# Patient Record
Sex: Female | Born: 1950 | Race: Black or African American | Hispanic: No | Smoking: Never smoker
Health system: Southern US, Community
[De-identification: ages and names within clinical notes are randomized; demographics above are authoritative.]

## PROBLEM LIST (undated history)

## (undated) DIAGNOSIS — K635 Polyp of colon: Secondary | ICD-10-CM

## (undated) DIAGNOSIS — T7840XA Allergy, unspecified, initial encounter: Secondary | ICD-10-CM

## (undated) DIAGNOSIS — M1710 Unilateral primary osteoarthritis, unspecified knee: Secondary | ICD-10-CM

## (undated) DIAGNOSIS — M810 Age-related osteoporosis without current pathological fracture: Secondary | ICD-10-CM

## (undated) DIAGNOSIS — E079 Disorder of thyroid, unspecified: Secondary | ICD-10-CM

## (undated) DIAGNOSIS — H409 Unspecified glaucoma: Secondary | ICD-10-CM

## (undated) DIAGNOSIS — M109 Gout, unspecified: Secondary | ICD-10-CM

## (undated) DIAGNOSIS — I10 Essential (primary) hypertension: Secondary | ICD-10-CM

## (undated) DIAGNOSIS — E119 Type 2 diabetes mellitus without complications: Secondary | ICD-10-CM

## (undated) DIAGNOSIS — I5032 Chronic diastolic (congestive) heart failure: Secondary | ICD-10-CM

## (undated) DIAGNOSIS — E785 Hyperlipidemia, unspecified: Secondary | ICD-10-CM

## (undated) HISTORY — PX: ENUCLEATION: SHX628

## (undated) HISTORY — DX: Hyperlipidemia, unspecified: E78.5

## (undated) HISTORY — DX: Type 2 diabetes mellitus without complications: E11.9

## (undated) HISTORY — PX: SALPINGECTOMY: SHX328

## (undated) HISTORY — DX: Chronic diastolic (congestive) heart failure: I50.32

## (undated) HISTORY — DX: Unilateral primary osteoarthritis, unspecified knee: M17.10

## (undated) HISTORY — PX: COLON SURGERY: SHX602

## (undated) HISTORY — DX: Unspecified glaucoma: H40.9

## (undated) HISTORY — DX: Allergy, unspecified, initial encounter: T78.40XA

## (undated) HISTORY — DX: Essential (primary) hypertension: I10

## (undated) HISTORY — DX: Polyp of colon: K63.5

## (undated) HISTORY — PX: OOPHORECTOMY: SHX86

## (undated) HISTORY — PX: WISDOM TOOTH EXTRACTION: SHX21

## (undated) HISTORY — DX: Age-related osteoporosis without current pathological fracture: M81.0

## (undated) HISTORY — PX: THYROIDECTOMY, PARTIAL: SHX18

## (undated) HISTORY — DX: Gout, unspecified: M10.9

---

## 1998-07-07 ENCOUNTER — Emergency Department (HOSPITAL_COMMUNITY): Admission: EM | Admit: 1998-07-07 | Discharge: 1998-07-07 | Payer: Self-pay | Admitting: Emergency Medicine

## 1998-08-11 ENCOUNTER — Ambulatory Visit: Admission: RE | Admit: 1998-08-11 | Discharge: 1998-08-11 | Payer: Self-pay | Admitting: Internal Medicine

## 1998-09-09 ENCOUNTER — Encounter: Payer: Self-pay | Admitting: Orthopedic Surgery

## 1998-09-09 ENCOUNTER — Ambulatory Visit (HOSPITAL_COMMUNITY): Admission: RE | Admit: 1998-09-09 | Discharge: 1998-09-09 | Payer: Self-pay | Admitting: Orthopedic Surgery

## 1998-09-15 ENCOUNTER — Encounter: Admission: RE | Admit: 1998-09-15 | Discharge: 1998-12-14 | Payer: Self-pay | Admitting: Orthopedic Surgery

## 1998-09-30 ENCOUNTER — Ambulatory Visit (HOSPITAL_COMMUNITY): Admission: RE | Admit: 1998-09-30 | Discharge: 1998-09-30 | Payer: Self-pay | Admitting: Orthopedic Surgery

## 1999-05-13 ENCOUNTER — Emergency Department (HOSPITAL_COMMUNITY): Admission: EM | Admit: 1999-05-13 | Discharge: 1999-05-13 | Payer: Self-pay | Admitting: Emergency Medicine

## 1999-09-29 ENCOUNTER — Other Ambulatory Visit: Admission: RE | Admit: 1999-09-29 | Discharge: 1999-09-29 | Payer: Self-pay | Admitting: Gynecology

## 1999-09-29 ENCOUNTER — Encounter (INDEPENDENT_AMBULATORY_CARE_PROVIDER_SITE_OTHER): Payer: Self-pay

## 1999-11-10 ENCOUNTER — Encounter (INDEPENDENT_AMBULATORY_CARE_PROVIDER_SITE_OTHER): Payer: Self-pay | Admitting: Specialist

## 1999-11-10 ENCOUNTER — Ambulatory Visit (HOSPITAL_COMMUNITY): Admission: RE | Admit: 1999-11-10 | Discharge: 1999-11-10 | Payer: Self-pay | Admitting: Gynecology

## 1999-11-18 ENCOUNTER — Other Ambulatory Visit: Admission: RE | Admit: 1999-11-18 | Discharge: 1999-11-18 | Payer: Self-pay | Admitting: Gynecology

## 2000-02-02 ENCOUNTER — Other Ambulatory Visit: Admission: RE | Admit: 2000-02-02 | Discharge: 2000-02-02 | Payer: Self-pay | Admitting: Gynecology

## 2000-02-14 ENCOUNTER — Inpatient Hospital Stay (HOSPITAL_COMMUNITY): Admission: RE | Admit: 2000-02-14 | Discharge: 2000-02-16 | Payer: Self-pay | Admitting: Gynecology

## 2000-02-14 ENCOUNTER — Encounter (INDEPENDENT_AMBULATORY_CARE_PROVIDER_SITE_OTHER): Payer: Self-pay

## 2000-10-23 HISTORY — PX: TOTAL ABDOMINAL HYSTERECTOMY: SHX209

## 2001-05-28 ENCOUNTER — Other Ambulatory Visit: Admission: RE | Admit: 2001-05-28 | Discharge: 2001-05-28 | Payer: Self-pay | Admitting: Gynecology

## 2003-04-29 ENCOUNTER — Other Ambulatory Visit: Admission: RE | Admit: 2003-04-29 | Discharge: 2003-04-29 | Payer: Self-pay | Admitting: Gynecology

## 2004-02-18 ENCOUNTER — Ambulatory Visit (HOSPITAL_COMMUNITY): Admission: RE | Admit: 2004-02-18 | Discharge: 2004-02-18 | Payer: Self-pay | Admitting: Internal Medicine

## 2004-04-29 ENCOUNTER — Other Ambulatory Visit: Admission: RE | Admit: 2004-04-29 | Discharge: 2004-04-29 | Payer: Self-pay | Admitting: Gynecology

## 2005-06-30 ENCOUNTER — Ambulatory Visit: Payer: Self-pay | Admitting: Internal Medicine

## 2005-07-07 ENCOUNTER — Ambulatory Visit: Payer: Self-pay | Admitting: Internal Medicine

## 2005-07-28 ENCOUNTER — Ambulatory Visit: Payer: Self-pay | Admitting: Internal Medicine

## 2005-10-09 ENCOUNTER — Ambulatory Visit: Payer: Self-pay | Admitting: Gastroenterology

## 2005-10-18 ENCOUNTER — Encounter (INDEPENDENT_AMBULATORY_CARE_PROVIDER_SITE_OTHER): Payer: Self-pay | Admitting: Specialist

## 2005-10-18 ENCOUNTER — Ambulatory Visit: Payer: Self-pay | Admitting: Gastroenterology

## 2005-11-17 ENCOUNTER — Ambulatory Visit (HOSPITAL_COMMUNITY): Admission: RE | Admit: 2005-11-17 | Discharge: 2005-11-17 | Payer: Self-pay | Admitting: Gastroenterology

## 2006-10-03 ENCOUNTER — Ambulatory Visit: Payer: Self-pay | Admitting: Internal Medicine

## 2006-10-03 LAB — CONVERTED CEMR LAB
ALT: 17 units/L (ref 0–40)
Albumin: 4.3 g/dL (ref 3.5–5.2)
Alkaline Phosphatase: 65 units/L (ref 39–117)
Calcium: 9.8 mg/dL (ref 8.4–10.5)
Chol/HDL Ratio, serum: 3.6
Creatinine, Ser: 0.7 mg/dL (ref 0.4–1.2)
GFR calc non Af Amer: 92 mL/min
Glomerular Filtration Rate, Af Am: 112 mL/min/{1.73_m2}
Hgb A1c MFr Bld: 5.7 % (ref 4.6–6.0)
Potassium: 4.2 meq/L (ref 3.5–5.1)
Sodium: 141 meq/L (ref 135–145)
Total Bilirubin: 1.1 mg/dL (ref 0.3–1.2)
VLDL: 14 mg/dL (ref 0–40)

## 2007-07-12 ENCOUNTER — Ambulatory Visit: Payer: Self-pay | Admitting: Internal Medicine

## 2007-07-12 ENCOUNTER — Encounter: Payer: Self-pay | Admitting: Internal Medicine

## 2007-07-12 DIAGNOSIS — I10 Essential (primary) hypertension: Secondary | ICD-10-CM | POA: Insufficient documentation

## 2007-07-12 DIAGNOSIS — M1710 Unilateral primary osteoarthritis, unspecified knee: Secondary | ICD-10-CM | POA: Insufficient documentation

## 2007-09-05 ENCOUNTER — Telehealth: Payer: Self-pay | Admitting: Internal Medicine

## 2007-10-14 ENCOUNTER — Encounter: Admission: RE | Admit: 2007-10-14 | Discharge: 2007-10-14 | Payer: Self-pay | Admitting: Orthopedic Surgery

## 2007-11-06 ENCOUNTER — Ambulatory Visit (HOSPITAL_COMMUNITY): Admission: RE | Admit: 2007-11-06 | Discharge: 2007-11-07 | Payer: Self-pay | Admitting: Orthopedic Surgery

## 2007-12-05 ENCOUNTER — Encounter: Admission: RE | Admit: 2007-12-05 | Discharge: 2008-03-04 | Payer: Self-pay | Admitting: Orthopedic Surgery

## 2008-03-05 ENCOUNTER — Encounter: Admission: RE | Admit: 2008-03-05 | Discharge: 2008-06-03 | Payer: Self-pay | Admitting: Orthopedic Surgery

## 2008-03-09 ENCOUNTER — Encounter: Admission: RE | Admit: 2008-03-09 | Discharge: 2008-03-09 | Payer: Self-pay | Admitting: Orthopedic Surgery

## 2008-07-15 ENCOUNTER — Encounter: Admission: RE | Admit: 2008-07-15 | Discharge: 2008-07-15 | Payer: Self-pay | Admitting: Orthopedic Surgery

## 2008-09-14 ENCOUNTER — Ambulatory Visit (HOSPITAL_COMMUNITY): Admission: RE | Admit: 2008-09-14 | Discharge: 2008-09-14 | Payer: Self-pay | Admitting: Orthopedic Surgery

## 2008-09-21 ENCOUNTER — Encounter: Payer: Self-pay | Admitting: Internal Medicine

## 2008-09-22 ENCOUNTER — Ambulatory Visit: Payer: Self-pay | Admitting: Internal Medicine

## 2008-09-22 DIAGNOSIS — H409 Unspecified glaucoma: Secondary | ICD-10-CM | POA: Insufficient documentation

## 2008-09-22 DIAGNOSIS — J309 Allergic rhinitis, unspecified: Secondary | ICD-10-CM | POA: Insufficient documentation

## 2008-09-22 DIAGNOSIS — M109 Gout, unspecified: Secondary | ICD-10-CM | POA: Insufficient documentation

## 2008-09-22 LAB — CONVERTED CEMR LAB
Basophils Absolute: 0.1 10*3/uL (ref 0.0–0.1)
CO2: 30 meq/L (ref 19–32)
Calcium: 9.4 mg/dL (ref 8.4–10.5)
Eosinophils Absolute: 0.2 10*3/uL (ref 0.0–0.7)
Glucose, Bld: 103 mg/dL — ABNORMAL HIGH (ref 70–99)
HDL: 63.9 mg/dL (ref >39.0)
Hemoglobin: 12.2 g/dL (ref 12.0–15.0)
MCV: 86.4 fL (ref 78.0–100.0)
Neutrophils Relative %: 53.2 % (ref 43.0–77.0)
Potassium: 4.3 meq/L (ref 3.5–5.1)
RBC: 4.1 M/uL (ref 3.87–5.11)
Sodium: 139 meq/L (ref 135–145)
Total CHOL/HDL Ratio: 3.2
Triglycerides: 106 mg/dL (ref 0–149)
Uric Acid, Serum: 4.3 mg/dL (ref 2.4–7.0)

## 2008-09-28 ENCOUNTER — Encounter: Payer: Self-pay | Admitting: Internal Medicine

## 2009-11-04 ENCOUNTER — Ambulatory Visit: Payer: Self-pay | Admitting: Internal Medicine

## 2009-11-04 LAB — CONVERTED CEMR LAB
AST: 16 units/L (ref 0–37)
Albumin: 4.4 g/dL (ref 3.5–5.2)
BUN: 10 mg/dL (ref 6–23)
Bilirubin Urine: NEGATIVE
Bilirubin, Direct: 0.1 mg/dL (ref 0.0–0.3)
CO2: 27 meq/L (ref 19–32)
Cholesterol: 213 mg/dL — ABNORMAL HIGH (ref 0–200)
Creatinine, Ser: 0.8 mg/dL (ref 0.4–1.2)
Direct LDL: 132.2 mg/dL
Glucose, Bld: 115 mg/dL — ABNORMAL HIGH (ref 70–99)
Ketones, ur: NEGATIVE mg/dL
Leukocytes, UA: NEGATIVE
Lymphocytes Relative: 37.2 % (ref 12.0–46.0)
MCHC: 33.2 g/dL (ref 30.0–36.0)
MCV: 87.2 fL (ref 78.0–100.0)
Monocytes Relative: 5.4 % (ref 3.0–12.0)
Platelets: 279 10*3/uL (ref 150.0–400.0)
Potassium: 4.1 meq/L (ref 3.5–5.1)
RDW: 12.9 % (ref 11.5–14.6)
Sodium: 134 meq/L — ABNORMAL LOW (ref 135–145)
Specific Gravity, Urine: 1.02 (ref 1.000–1.030)
TSH: 1.19 microintl units/mL (ref 0.35–5.50)
Total Protein, Urine: NEGATIVE mg/dL
Urine Glucose: NEGATIVE mg/dL
Urobilinogen, UA: 0.2 (ref 0.0–1.0)

## 2009-11-09 ENCOUNTER — Ambulatory Visit: Payer: Self-pay | Admitting: Internal Medicine

## 2009-11-10 ENCOUNTER — Encounter: Payer: Self-pay | Admitting: Internal Medicine

## 2009-11-29 ENCOUNTER — Telehealth (INDEPENDENT_AMBULATORY_CARE_PROVIDER_SITE_OTHER): Payer: Self-pay | Admitting: *Deleted

## 2010-09-13 ENCOUNTER — Encounter: Payer: Self-pay | Admitting: Gastroenterology

## 2010-09-28 ENCOUNTER — Encounter: Payer: Self-pay | Admitting: Gastroenterology

## 2010-09-29 ENCOUNTER — Encounter (INDEPENDENT_AMBULATORY_CARE_PROVIDER_SITE_OTHER): Payer: Self-pay | Admitting: *Deleted

## 2010-10-03 ENCOUNTER — Ambulatory Visit: Payer: Self-pay | Admitting: Gastroenterology

## 2010-10-03 ENCOUNTER — Encounter
Admission: RE | Admit: 2010-10-03 | Discharge: 2010-10-03 | Payer: Self-pay | Source: Home / Self Care | Attending: Orthopedic Surgery | Admitting: Orthopedic Surgery

## 2010-10-14 ENCOUNTER — Ambulatory Visit: Payer: Self-pay | Admitting: Gastroenterology

## 2010-10-14 DIAGNOSIS — R933 Abnormal findings on diagnostic imaging of other parts of digestive tract: Secondary | ICD-10-CM | POA: Insufficient documentation

## 2010-10-14 HISTORY — PX: COLONOSCOPY: SHX174

## 2010-10-18 ENCOUNTER — Telehealth: Payer: Self-pay | Admitting: Gastroenterology

## 2010-10-31 ENCOUNTER — Encounter: Payer: Self-pay | Admitting: Gastroenterology

## 2010-11-13 ENCOUNTER — Encounter: Payer: Self-pay | Admitting: Gastroenterology

## 2010-11-16 LAB — BASIC METABOLIC PANEL
Calcium: 9.3 mg/dL (ref 8.4–10.5)
Creatinine, Ser: 0.97 mg/dL (ref 0.4–1.2)
GFR calc Af Amer: >60 mL/min (ref >60)
GFR calc non Af Amer: 59 mL/min — ABNORMAL LOW (ref >60)
Sodium: 138 mEq/L (ref 135–145)

## 2010-11-18 ENCOUNTER — Ambulatory Visit
Admission: RE | Admit: 2010-11-18 | Discharge: 2010-11-18 | Payer: Self-pay | Source: Home / Self Care | Attending: Surgery | Admitting: Surgery

## 2010-11-18 LAB — POCT I-STAT, CHEM 8
Chloride: 106 mEq/L (ref 96–112)
Potassium: 3.7 mEq/L (ref 3.5–5.1)

## 2010-11-22 NOTE — Assessment & Plan Note (Signed)
Summary: CPX / NWS   #   Vital Signs:  Patient profile:   60 year old female Height:      67 inches Weight:      181 pounds BMI:     28.45 O2 Sat:      98 % on Room air Temp:     98.2 degrees F oral Pulse rate:   58 / minute BP sitting:   158 / 82  (left arm) Cuff size:   large  Vitals Entered By: Bill Salinas CMA (November 09, 2009 2:32 PM)  O2 Flow:  Room air CC: pt here for CPX. she will get her flu shot today and states she is sch for a mammo tomm. pt gets his paps/ab  Vision Screening:      Vision Comments: Last Eye exam 09/2009 which was normal with Dr Melene Muller at [facility] Entered By: Bill Salinas CMA (November 09, 2009 2:37 PM)   Primary Care Provider:  Collie Kittel  CC:  pt here for CPX. she will get her flu shot today and states she is sch for a mammo tomm. pt gets his paps/ab.  History of Present Illness: Patient presents for routine medical followup and medication refills. She is under a little pressure with a family member living with her and others with illness: sister hip replacement and brother with hemorrhage.  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 She is also having problems with her right hip. She sees Dr. Merton Border carter for ortho.  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   She had a mole removed from left check - path report pending.   Current Medications (verified): 1)  Furosemide 20 Mg  Tabs (Furosemide) .... Once Daily 2)  Nasonex 50 Mcg/act  Susp (Mometasone Furoate) .Marland Kitchen.. 1 Spray/nare Two Times A Day 3)  Astelin 137 Mcg/spray  Soln (Azelastine Hcl) .Marland Kitchen.. 1 Spray/nares Two Times A Day 4)  Amlodipine Besylate 10 Mg  Tabs (Amlodipine Besylate) .... Once Daily 5)  Alphagan P 0.1 % Soln (Brimonidine Tartrate) .... Take 1 Tablet  By Mouth Three Times A Day 6)  Travatan Z 0.004 % Soln (Travoprost) .... Once Daily 7)  Cosopt 2-0.5 % Soln (Dorzolamide-Timolol) .... Two Times A Day 8)  Elestat 0.05 % Soln (Epinastine Hcl) .... Once Daily 9)  Naprelan 375 Mg Xr24h-Tab (Naproxen Sodium) .... Take 1 Tablet By Mouth Two Times A Day 10)  Voltaren 1 % Gel (Diclofenac Sodium) .... Three Times A Day 11)  Retin-A Micro 0.1 % Gel (Tretinoin Microsphere) .... Once Daily  Allergies (verified): 1)  ! Penicillin 2)  ! 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 Marcelle Smiling         Dr. Myrtie Neither                Opthal  Dr. Harlon Flor                Allergy      Dr. Westport Callas  Family History: Last updated: 10/11/2008 father - deceased @ 72: emphysema, HTN, DM mother - 13: OA, HTN Neg- breast or colon cancer; CAD/MI  Social History: Last updated: 11-Oct-2008 HSG, 2 year college Married '73  no children SO- self-employed Homemaker  Review of Systems       The patient complains of severe indigestion/heartburn.  The patient denies anorexia, fever, weight loss, weight gain, hoarseness, chest pain, dyspnea on exertion, abdominal pain, hematochezia, incontinence, difficulty walking, abnormal bleeding, and breast masses.         if she doesn't eat on time she will have bloating, gas and pain.   Physical Exam  General:  WNWD AA female in no distress Head:  normocephalic, atraumatic, and no abnormalities observed.   Eyes:  Prosthetic right eye. OS- C&S clear, pupil reactive. Ears:  R ear normal, L ear normal, and no external deformities.   Nose:  no external deformity and no external erythema.   Mouth:  Oral mucosa and oropharynx without lesions or exudates.  Teeth  in good repair. Neck:  supple, no thyromegaly, and no carotid bruits.   Chest Wall:  no deformities.   Breasts:  deferred to gyun  Lungs:  Normal respiratory effort, chest expands symmetrically. Lungs are clear to auscultation, no crackles or wheezes. Heart:  Normal rate and regular rhythm. S1 and S2 normal without gallop, murmur, click, rub or other extra sounds. Abdomen:  soft, non-tender, normal bowel sounds, and no hepatomegaly.   Genitalia:  deferred to gyn Msk:  normal ROM, no joint tenderness, no joint swelling, no joint warmth, and no joint instability.  Right leg with outward rotation Pulses:  2+ radial pulses Extremities:  No clubbing, cyanosis, edema, or deformity noted with normal full range of motion of all joints.   Neurologic:  alert & oriented X3, cranial nerves II-XII intact, strength normal in all extremities, gait normal, and DTRs symmetrical and normal.   Skin:  turgor normal, color normal, no rashes, no suspicious lesions, no ulcerations, and no edema.   Cervical Nodes:  no anterior cervical adenopathy and no posterior cervical adenopathy.   Psych:  Oriented X3, memory intact for recent and remote, normally interactive, and good eye contact.     Impression & Recommendations:  Problem # 1:  GLAUCOMA, LEFT EYE (ICD-365.9) Follow closely by opthalmology  Problem # 2:  OSTEOARTHROSIS, LOCAL, PRIMARY, LOWER LEG (ICD-715.16) Followed by Dr. Montez Morita. She is developing some right hip pain most likely due to malalignment of the right leg.   Plan - per Dr. Montez Morita  Her updated medication list for this problem includes:    Naprelan 375 Mg Xr24h-tab (Naproxen sodium) .Marland Kitchen... Take 1 tablet by mouth two times a day  Problem # 3:  HYPERTENSION, ESSENTIAL NOS (ICD-401.9)  Her updated medication list for this problem includes:    Furosemide 20 Mg Tabs (Furosemide) ..... Once daily    Amlodipine Besylate 10 Mg Tabs (Amlodipine besylate) ..... Once daily  BP today: 158/82 Prior BP:  136/84 (2008/10/11)  Labs Reviewed: K+: 4.1 (11/04/2009) Creat: : 0.8 (11/04/2009)   Chol: 213 (11/04/2009)   HDL: 66.80 (11/04/2009)   LDL: DEL (11-Oct-2008)   TG: 85.0 (11/04/2009)  Mildly elevated at today's visit. Previous readings OK.  Plan - monitoring athome and if SBP runs great than 140 routinely will need to adjust medications.  Problem # 4:  Preventive Health Care (ICD-V70.0) Normal exam. She is  s/p hysterectomy for fibroid tumors and was told that she needs a vulvovaginal exam every 3-5 years. She will schedule with her gynecologist or here at her descretion. She will schedule a mammorgram which is over due. Last colonoscopy was '06. She may be due tetnus booster. She did have flu vaccine.   In summary - a pleasant woman who needs to monitor her blood pressure for compliance. She is otherwise doing well: healthy diet and she will increase her exercise. She will return as needed.   Complete Medication List: 1)  Furosemide 20 Mg Tabs (Furosemide) .... Once daily 2)  Nasonex 50 Mcg/act Susp (Mometasone furoate) .Marland Kitchen.. 1 spray/nare two times a day 3)  Astelin 137 Mcg/spray Soln (Azelastine hcl) .Marland Kitchen.. 1 spray/nares two times a day 4)  Amlodipine Besylate 10 Mg Tabs (Amlodipine besylate) .... Once daily 5)  Alphagan P 0.1 % Soln (Brimonidine tartrate) .... Take 1 tablet by mouth three times a day 6)  Travatan Z 0.004 % Soln (Travoprost) .... Once daily 7)  Cosopt 2-0.5 % Soln (Dorzolamide-timolol) .... Two times a day 8)  Elestat 0.05 % Soln (Epinastine hcl) .... Once daily 9)  Naprelan 375 Mg Xr24h-tab (Naproxen sodium) .... Take 1 tablet by mouth two times a day 10)  Voltaren 1 % Gel (Diclofenac sodium) .... Three times a day 11)  Retin-a Micro 0.1 % Gel (Tretinoin microsphere) .... Once daily 12)  Bepreve 1.5 % Soln (Bepotastine besilate) .Marland Kitchen.. 1 qtt once daily  Other Orders: Flu Vaccine 44yrs + (98119) Administration Flu vaccine - MCR ([id number])  Patient: Jennifer Huang Note:  All result statuses are Final unless otherwise noted.  Tests: (1) BMP (METABOL)   Sodium               [L]  134 mEq/L                   135-145   Potassium                 4.1 mEq/L                   3.5-5.1   Chloride                  100 mEq/L                   96-112   Carbon Dioxide            27 mEq/L                    19-32   Glucose              [H]  115 mg/dL                   95-62   BUN                       10 mg/dL                    1-30   Creatinine                0.8 mg/dL                   8.6-5.7   Calcium  9.2 mg/dL                   1.6-10.9   GFR                       94.46 mL/min                >60  Tests: (2) CBC Platelet w/Diff (CBCD)   White Cell Count          5.7 K/uL                    4.5-10.5   Red Cell Count            4.17 Mil/uL                 3.87-5.11   Hemoglobin                12.1 g/dL                   60.4-54.0   Hematocrit                36.4 %                      36.0-46.0   MCV                       87.2 fl                     78.0-100.0   MCHC                      33.2 g/dL                   98.1-19.1   RDW                       12.9 %                      11.5-14.6   Platelet Count            279.0 K/uL                  150.0-400.0   Neutrophil %              54.6 %                      43.0-77.0   Lymphocyte %              37.2 %                      12.0-46.0   Monocyte %                5.4 %                       3.0-12.0   Eosinophils%              2.1 %                       0.0-5.0   Basophils %               0.7 %  0.0-3.0   Neutrophill Absolute      3.2 K/uL                    1.4-7.7   Lymphocyte Absolute       2.1 K/uL                    0.7-4.0   Monocyte Absolute         0.3 K/uL                    0.1-1.0  Eosinophils, Absolute                             0.1 K/uL                    0.0-0.7   Basophils Absolute        0.0 K/uL                    0.0-0.1  Tests: (3) Hepatic/Liver  Function Panel (HEPATIC)   Total Bilirubin           1.0 mg/dL                   6.6-4.4   Direct Bilirubin          0.1 mg/dL                   0.3-4.7   Alkaline Phosphatase      66 U/L                      39-117   AST                       16 U/L                      0-37   ALT                       15 U/L                      0-35   Total Protein             7.3 g/dL                    4.2-5.9   Albumin                   4.4 g/dL                    5.6-3.8  Tests: (4) TSH (TSH)   FastTSH                   1.19 uIU/mL                 0.35-5.50  Tests: (5) Lipid Panel (LIPID)   Cholesterol          [H]  213 mg/dL                   7-564     ATP III Classification            Desirable:  < 200 mg/dL  Borderline High:  200 - 239 mg/dL               High:  > = 240 mg/dL   Triglycerides             85.0 mg/dL                  7.8-469.6     Normal:  <150 mg/dL     Borderline High:  295 - 199 mg/dL   HDL                       28.41 mg/dL                 >32.44   VLDL Cholesterol          17.0 mg/dL                  0.1-02.7  CHO/HDL Ratio:  CHD Risk                             3                    Men          Women     1/2 Average Risk     3.4          3.3     Average Risk          5.0          4.4     2X Average Risk          9.6          7.1     3X Average Risk          15.0          11.0                           Tests: (6) UDip Only (UDIP)   Color                     LT. YELLOW       RANGE:  Yellow;Lt. Yellow   Clarity                   CLEAR                       Clear   Specific Gravity          1.020                       1.000 - 1.030   Urine Ph                  5.5                         5.0-8.0   Protein                   NEGATIVE                    Negative   Urine Glucose             NEGATIVE  Negative   Ketones                   NEGATIVE                    Negative   Urine Bilirubin           NEGATIVE                     Negative   Blood                     TRACE-LYSED                 Negative   Urobilinogen              0.2                         0.0 - 1.0   Leukocyte Esterace        NEGATIVE                    Negative   Nitrite                   NEGATIVE                    Negative  Tests: (7) Cholesterol LDL - Direct (DIRLDL)  Cholesterol LDL - Direct                             132.2 mg/dLPrescriptions: AMLODIPINE BESYLATE 10 MG  TABS (AMLODIPINE BESYLATE) once daily  #90 x 3   Entered and Authorized by:   Jacques Navy MD   Signed by:   Jacques Navy MD on 11/09/2009   Method used:   Electronically to        [facility]* (mail-order)             ,          Ph: [phone]       Fax: [phone]   RxID:   [id number] FUROSEMIDE 20 MG  TABS (FUROSEMIDE) once daily  #90 x 3   Entered and Authorized by:   Jacques Navy MD   Signed by:   Jacques Navy MD on 11/09/2009   Method used:   Electronically to        [facility]* (mail-order)             ,          Ph: [phone]       Fax: [phone]   RxID:   [id number]   Flu Vaccine Consent Questions     Do you have a history of severe allergic reactions to this vaccine? no    Any prior history of allergic reactions to egg and/or gelatin? no    Do you have a sensitivity to the preservative Thimersol? no    Do you have a past history of Guillan-Barre Syndrome? no    Do you currently have an acute febrile illness? no    Have you ever had a severe reaction to latex? no    Vaccine information given and explained to patient? yes    Are you currently pregnant? no    Lot Number:[id number]   Exp Date:04/21/2010   Site Given  Left Deltoid IMedflu

## 2010-11-22 NOTE — Letter (Signed)
Summary: Colonoscopy Letter  [facility]  [address] [place], [place] [address]   Phone: [phone]  Fax: [phone]      September 13, 2010 MRN: [id number]   [facility] [address] [place], [place]  [address]   Dear Ms. Novacek,   According to your medical record, it is time for you to schedule a Colonoscopy. The American Cancer Society recommends this procedure as a method to detect early colon cancer. Patients with a family history of colon cancer, or a personal history of colon polyps or inflammatory bowel disease are at increased risk.  This letter has been generated based on the recommendations made at the time of your procedure. If you feel that in your particular situation this may no longer apply, please contact our office.  Please call our office at [phone] to schedule this appointment or to update your records at your earliest convenience.  Thank you for cooperating with [place] to provide you with the very best care possible.   Sincerely,  Rachael Fee, M.D.  [facility]astro[facility]ology Division [phone]

## 2010-11-22 NOTE — Progress Notes (Signed)
    Preventive Care Screening  Mammogram:    Date:  11/10/2009    Results:  normal bilateral

## 2010-11-22 NOTE — Letter (Signed)
Summary: Pre Visit Letter Revised  [facility]  [address] [place], [place] [address]   Phone: [phone]  Fax: [phone]        09/28/2010 MRN: [id number]  [facility] [address] [place], [place]  [address]             Procedure Date:  12-23 AT 9:30am           Dr Christella Hartigan -Recall Colon   Welcome to the Gastroenterology Division at [facility].    You are scheduled to see a nurse for your pre-procedure visit on 10-03-10 at 11am on the 3rd floor at [facility], [address]  We ask that you try to arrive at our office 15 minutes prior to your appointment time to allow for check-in.  Please take a minute to review the attached form.  If you answer "Yes" to one or more of the questions on the first page, we ask that you call the person listed at your earliest opportunity.  If you answer "No" to all of the questions, please complete the rest of the form and bring it to your appointment.    Your nurse visit will consist of discussing your medical and surgical history, your immediate family medical history, and your medications.   If you are unable to list all of your medications on the form, please bring the medication bottles to your appointment and we will list them.  We will need to be aware of both prescribed and over the counter drugs.  We will need to know exact dosage information as well.    Please be prepared to read and sign documents such as consent forms, a financial agreement, and acknowledgement forms.  If necessary, and with your consent, a friend or relative is welcome to sit-in on the nurse visit with you.  Please bring your insurance card so that we may make a copy of it.  If your insurance requires a referral to see a specialist, please bring your referral form from your primary care physician.  No co-pay is required for this nurse visit.     If you cannot keep your appointment, please call [phone] to cancel or reschedule prior  to your appointment date.  This allows [place] the opportunity to schedule an appointment for another patient in need of care.    Thank you for choosing [facility] for your medical needs.  We appreciate the opportunity to care for you.  Please visit [place] at our website  to learn more about our practice.  Sincerely, The Gastroenterology Division

## 2010-11-24 NOTE — Op Note (Signed)
  NAMELORE, POLKA             ACCOUNT NO.:  [id number]  MEDICAL RECORD NO.:  [id number]          PATIENT TYPE:  AMB  LOCATION:  DSC                          FACILITY:  [facility]  PHYSICIAN:  Abigail Miyamoto, M.D. DATE OF BIRTH:  [birth year]  DATE OF PROCEDURE:  11/18/2010 DATE OF DISCHARGE:                              OPERATIVE REPORT   PREOPERATIVE DIAGNOSIS:  Anal polyp.  POSTOPERATIVE DIAGNOSIS:  Anal polyp.  PROCEDURE:  Examination under anesthesia with excision of anal polyp.  SURGEON:  Abigail Miyamoto, MD  ANESTHESIA:  General and 0.25% Marcaine with epinephrine.  ESTIMATED BLOOD LOSS:  Minimal.  FINDINGS:  The patient was found to have a solitary anal polyp on a long narrow stalk at the left lateral location with the patient in the lithotomy position.  PROCEDURE IN DETAIL:  The patient was brought to the operating room and identified as Jennifer Huang.  She was placed supine on the operating room table and general anesthesia was induced.  The patient was then placed in the lithotomy position.  Her perianal air was then prepped and draped in the usual sterile fashion.  A retractor was inserted into the anal canal and circumferential inspection was undertaken.  The patient had 1 long narrow polyp at approximately the 5 o'clock position of the anal canal with the patient in lithotomy.  I was able to easily grasp this with an Allis clamp and then excised this base of the electrocautery.  Hemostasis was achieved with cautery.  The biopsy site was so small and I did not put a suture in it.  I then anesthetized the perianal area circumferentially with the 0.25% Marcaine with epinephrine.  The patient the procedure well.  All counts were correct at the end of the procedure.  The patient was then extubated in the operating room and taken in stable condition to the recovery room.     Abigail Miyamoto, M.D.     DB/MEDQ  D:  11/18/2010  T:  11/19/2010  Job:   [id number]  Electronically Signed by Abigail Miyamoto M.D. on 11/24/2010 10:08:27 AM

## 2010-11-24 NOTE — Procedures (Signed)
Summary: Colonoscopy  Patient: Jennifer Huang Note: All result statuses are Final unless otherwise noted.  Tests: (1) Colonoscopy (COL)   COL Colonoscopy           DONE     [facility]     [address]     [place], [place]  [address]           COLONOSCOPY PROCEDURE REPORT           PATIENT:  Jennifer Huang, Jennifer Huang  MR#:  [id number]     BIRTHDATE:  Feb 04, 1951, 59 yrs. old  GENDER:  female     ENDOSCOPIST:  Rachael Fee, MD     PROCEDURE DATE:  10/14/2010     PROCEDURE:  Diagnostic Colonoscopy     ASA CLASS:  Class II     INDICATIONS:  history of pre-cancerous (adenomatous) colon polyps,     2006     MEDICATIONS:  Fentanyl 75 mcg IV, Versed 7 mg IV           DESCRIPTION OF PROCEDURE:   After the risks benefits and     alternatives of the procedure were thoroughly explained, informed     consent was obtained.  Digital rectal exam was performed and     revealed no rectal masses.   The LB [id number]-[id number] endoscope     was introduced through the anus and advanced to the cecum, which     was identified by both the appendix and ileocecal valve, without     limitations.  The quality of the prep was good, using MoviPrep.     The instrument was then slowly withdrawn as the colon was fully     examined.     <<PROCEDUREIMAGES>>     FINDINGS:  There was a 7-20mm long internal skin tag, this may be     associated with a hemorrhoid. This tip looked somewhat     adenomatous.  (see image5).  This was otherwise a normal     examination of the colon (see image3 and image2).   Retroflexed     views in the rectum revealed no abnormalities.    The scope was     then withdrawn from the patient and the procedure completed.     COMPLICATIONS:  None           ENDOSCOPIC IMPRESSION:     1) Internal anal skin tag (perhaps with adenomatous polyp at     tip?)     2) Otherwise normal examination           RECOMMENDATIONS:     1) Given your personal history of adenomatous (pre-cancerous)   polyps, you will need a repeat colonoscopy in 5 years.     2) Dr. Christella Hartigan' office will arrange referral to general surgery     to consider removing the internal skin tag to make sure there is     no adenomatous polyp associated with it.           REPEAT EXAM:  5 years           ______________________________     Rachael Fee, MD           n.     eSIGNED:   Rachael Fee at 10/14/2010 09:48 AM           Sherren Kerns, [id number]  Note: An exclamation mark (!) indicates a result that was not dispersed into the flowsheet. Document Creation Date: 10/14/2010  9:48 AM _______________________________________________________________________  (1) Order result status: Final Collection or observation date-time: 10/14/2010 09:43 Requested date-time:  Receipt date-time:  Reported date-time:  Referring Physician:   Ordering Physician: Rob Bunting [phone]) Specimen Source:  Source: Launa Grill Order Number: [id number] Lab site:   Appended Document: Colonoscopy    Clinical Lists Changes  Observations: Added new observation of COLONNXTDUE: 10/15/2015 (10/14/2010 10:14)      Appended Document: Orders Update/CCS    Clinical Lists Changes  Problems: Added new problem of NONSPECIFIC ABN FINDING RAD & OTH EXAM GI TRACT (ICD-793.4) Orders: Added new Test order of [facility] ([facility]) - Signed

## 2010-11-24 NOTE — Progress Notes (Signed)
Summary: number to be reached  Phone Note Call from Patient Call back at [phone]   Caller: Patient Call For: Dr Christella Hartigan Summary of Call: Patient states that she gave the wrong number for surgeon to reach her, the correct nmber is [phone] Initial call taken by: Tawni Levy,  October 18, 2010 9:52 AM  Follow-up for Phone Call        No answer,no message machine.   Teryl Lucy RN  October 18, 2010 10:19 AM pt aware of appt and [facility] was called and given correct phone number Follow-up by: Chales Abrahams CMA Duncan Dull),  October 19, 2010 8:37 AM

## 2010-11-24 NOTE — Letter (Signed)
Summary: [facility] Instructions  [facility]  [address] [place], [place] [address]   Phone: [phone]  Fax: [phone]       Jennifer Huang    [birth year]    MRN: [id number]        Procedure Day Dorna Bloom:  Farrell Ours  10/14/10     Arrival Time:  8:30AM     Procedure Time:  9:30AM     Location of Procedure:                    Juliann [facility]  [facility] (4th Floor)                     PREPARATION FOR COLONOSCOPY WITH MOVIPREP   Starting 5 days prior to your procedure 10/09/10 do not eat nuts, seeds, popcorn, corn, beans, peas,  salads, or any raw vegetables.  Do not take any fiber supplements (e.g. Metamucil, Citrucel, and Benefiber).  THE DAY BEFORE YOUR PROCEDURE         DATE: 10/13/10  DAY: THURSDAY  1.  Drink clear liquids the entire day-NO SOLID FOOD  2.  Do not drink anything colored red or purple.  Avoid juices with pulp.  No orange juice.  3.  Drink at least 64 oz. (8 glasses) of fluid/clear liquids during the day to prevent dehydration and help the prep work efficiently.  CLEAR LIQUIDS INCLUDE: Water Jello Ice Popsicles Tea (sugar ok, no milk/cream) Powdered fruit flavored drinks Coffee (sugar ok, no milk/cream) Gatorade Juice: apple, white grape, white cranberry  Lemonade Clear bullion, consomm, broth Carbonated beverages (any kind) Strained chicken noodle soup Hard Candy                             4.  In the morning, mix first dose of MoviPrep solution:    Empty 1 Pouch A and 1 Pouch B into the disposable container    Add lukewarm drinking water to the top line of the container. Mix to dissolve    Refrigerate (mixed solution should be used within 24 hrs)  5.  Begin drinking the prep at 5:00 p.m. The MoviPrep container is divided by 4 marks.   Every 15 minutes drink the solution down to the next mark (approximately 8 oz) until the full liter is complete.   6.  Follow completed prep with 16 oz of clear liquid of your choice  (Nothing red or purple).  Continue to drink clear liquids until bedtime.  7.  Before going to bed, mix second dose of MoviPrep solution:    Empty 1 Pouch A and 1 Pouch B into the disposable container    Add lukewarm drinking water to the top line of the container. Mix to dissolve    Refrigerate  THE DAY OF YOUR PROCEDURE      DATE: 10/14/10  DAY: FRIDAY  Beginning at 4:30AM (5 hours before procedure):         1. Every 15 minutes, drink the solution down to the next mark (approx 8 oz) until the full liter is complete.  2. Follow completed prep with 16 oz. of clear liquid of your choice.    3. You may drink clear liquids until 7:30AM (2 HOURS BEFORE PROCEDURE).   MEDICATION INSTRUCTIONS  Unless otherwise instructed, you should take regular prescription medications with a small sip of water   as early as possible the morning of your procedure.  Additional medication instructions: Hold Furosemide the day of procedure.         OTHER INSTRUCTIONS  You will need a responsible adult at least 60 years of age to accompany you and drive you home.   This person must remain in the waiting room during your procedure.  Wear loose fitting clothing that is easily removed.  Leave jewelry and other valuables at home.  However, you may wish to bring a book to read or  an iPod/MP3 player to listen to music as you wait for your procedure to start.  Remove all body piercing jewelry and leave at home.  Total time from sign-in until discharge is approximately 2-3 hours.  You should go home directly after your procedure and rest.  You can resume normal activities the  day after your procedure.  The day of your procedure you should not:   Drive   Make legal decisions   Operate machinery   Drink alcohol   Return to work  You will receive specific instructions about eating, activities and medications before you leave.    The above instructions have been reviewed and explained to  me by   Wyona Almas RN  October 03, 2010 11:28 AM     I fully understand and can verbalize these instructions _____________________________ Date _________

## 2010-11-24 NOTE — Miscellaneous (Signed)
Summary: [facility] Previsit/prep  Clinical Lists Changes  Medications: Added new medication of MOVIPREP 100 GM  SOLR (PEG-KCL-NACL-NASULF-NA ASC-C) As per prep instructions. - Signed Rx of MOVIPREP 100 GM  SOLR (PEG-KCL-NACL-NASULF-NA ASC-C) As per prep instructions.;  #1 x 0;  Signed;  Entered by: Wyona Almas RN;  Authorized by: Rachael Fee MD;  Method used: Electronically to [facility] # [id number]*, [address] [place], [place], [place]  [address], Ph: [phone] or [phone], Fax: [phone] Allergies: Changed allergy or adverse reaction from PENICILLIN to PENICILLIN Changed allergy or adverse reaction from SULFA to SULFA Observations: Added new observation of ALLERGY REV: Done (10/03/2010 10:54)    Prescriptions: MOVIPREP 100 GM  SOLR (PEG-KCL-NACL-NASULF-NA ASC-C) As per prep instructions.  #1 x 0   Entered by:   Wyona Almas RN   Authorized by:   Rachael Fee MD   Signed by:   Wyona Almas RN on 10/03/2010   Method used:   Electronically to        [facility] [address] [id number]* (retail)       [address]       [place], [place]  [address]       Ph: [phone] or [phone]       Fax: [phone]   RxID:   [id number]

## 2010-11-29 ENCOUNTER — Encounter: Payer: Self-pay | Admitting: Gastroenterology

## 2010-11-30 NOTE — Consult Note (Signed)
Summary: [facility]   Imported By: Sherian Rein 11/23/2010 10:54:50  _____________________________________________________________________  External Attachment:    Type:   Image     Comment:   External Document

## 2010-12-14 NOTE — Letter (Signed)
Summary: Shelly Rubenstein MD  Shelly Rubenstein MD   Imported By: [place]ester [place] 12/06/2010 08:54:24  _____________________________________________________________________  External Attachment:    Type:   Image     Comment:   External Document

## 2010-12-30 ENCOUNTER — Telehealth: Payer: Self-pay | Admitting: Internal Medicine

## 2011-01-04 ENCOUNTER — Encounter: Payer: Self-pay | Admitting: Internal Medicine

## 2011-01-10 NOTE — Progress Notes (Signed)
Summary: refills  Phone Note Call from Patient Call back at Home Phone [phone]   Caller: Patient---[phone] Call For: Dr Debby Bud Summary of Call: Pt needs blood pressure & fluid pill sent to [facility],mail order -- 90 day prescription. Pt has appt for CPX April 26th (that was the soonest appt for a Cpx). Please advise. Initial call taken by: Verdell Face,  December 30, 2010 12:13 PM  Follow-up for Phone Call        [facility] for 90 day supply w/rfs?  Follow-up by: Lamar Sprinkles, CMA,  December 30, 2010 5:49 PM  Additional Follow-up for Phone Call Additional follow up Details #1::        ok to refilol meds Additional Follow-up by: Jacques Navy MD,  December 30, 2010 5:59 PM    Prescriptions: AMLODIPINE BESYLATE 10 MG  TABS (AMLODIPINE BESYLATE) once daily  #90 x 3   Entered by:   Lamar Sprinkles, CMA   Authorized by:   Jacques Navy MD   Signed by:   Lamar Sprinkles, CMA on 01/02/2011   Method used:   Electronically to        [facility]* (retail)             ,          Ph: [phone]       Fax: [phone]   RxID:   [id number] FUROSEMIDE 20 MG  TABS (FUROSEMIDE) once daily  #90 x 3   Entered by:   Lamar Sprinkles, CMA   Authorized by:   Jacques Navy MD   Signed by:   Lamar Sprinkles, CMA on 01/02/2011   Method used:   Electronically to        [facility]* (retail)             ,          Ph: [phone]       Fax: [phone]   RxID:   [id number]

## 2011-01-31 ENCOUNTER — Encounter: Payer: Self-pay | Admitting: Internal Medicine

## 2011-02-16 ENCOUNTER — Encounter: Payer: Self-pay | Admitting: Internal Medicine

## 2011-02-16 ENCOUNTER — Ambulatory Visit (INDEPENDENT_AMBULATORY_CARE_PROVIDER_SITE_OTHER): Payer: Medicare Other | Admitting: Internal Medicine

## 2011-02-16 ENCOUNTER — Other Ambulatory Visit (INDEPENDENT_AMBULATORY_CARE_PROVIDER_SITE_OTHER): Payer: Medicare Other

## 2011-02-16 DIAGNOSIS — M1710 Unilateral primary osteoarthritis, unspecified knee: Secondary | ICD-10-CM

## 2011-02-16 DIAGNOSIS — M109 Gout, unspecified: Secondary | ICD-10-CM

## 2011-02-16 DIAGNOSIS — Z0000 Encounter for general adult medical examination without abnormal findings: Secondary | ICD-10-CM

## 2011-02-16 DIAGNOSIS — I10 Essential (primary) hypertension: Secondary | ICD-10-CM

## 2011-02-16 LAB — CBC WITH DIFFERENTIAL/PLATELET
Basophils Relative: 0.7 % (ref 0.0–3.0)
Eosinophils Absolute: 0.3 10*3/uL (ref 0.0–0.7)
Eosinophils Relative: 4.6 % (ref 0.0–5.0)
Hemoglobin: 11.8 g/dL — ABNORMAL LOW (ref 12.0–15.0)
Lymphocytes Relative: 32.8 % (ref 12.0–46.0)
Monocytes Relative: 5.5 % (ref 3.0–12.0)
Neutro Abs: 3.8 10*3/uL (ref 1.4–7.7)
Neutrophils Relative %: 56.4 % (ref 43.0–77.0)
RBC: 3.98 Mil/uL (ref 3.87–5.11)
WBC: 6.8 10*3/uL (ref 4.5–10.5)

## 2011-02-16 LAB — COMPREHENSIVE METABOLIC PANEL
AST: 27 U/L (ref 0–37)
Albumin: 4 g/dL (ref 3.5–5.2)
BUN: 17 mg/dL (ref 6–23)
CO2: 28 mEq/L (ref 19–32)
Calcium: 9.5 mg/dL (ref 8.4–10.5)
Chloride: 104 mEq/L (ref 96–112)
Creatinine, Ser: 0.7 mg/dL (ref 0.4–1.2)
GFR: 107.94 mL/min (ref >60.00)
Glucose, Bld: 88 mg/dL (ref 70–99)
Potassium: 5 mEq/L (ref 3.5–5.1)
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
## 2011-03-02 ENCOUNTER — Other Ambulatory Visit: Payer: Self-pay | Admitting: Orthopedic Surgery

## 2011-03-02 ENCOUNTER — Ambulatory Visit
Admission: RE | Admit: 2011-03-02 | Discharge: 2011-03-02 | Disposition: A | Discharge status: Home / Self Care | Payer: Medicare Other | Source: Ambulatory Visit | Attending: Orthopedic Surgery | Admitting: Orthopedic Surgery

## 2011-03-02 DIAGNOSIS — M25552 Pain in left hip: Secondary | ICD-10-CM

## 2011-03-07 NOTE — Op Note (Signed)
Jennifer Huang, Jennifer Huang             ACCOUNT NO.:  [id number]   MEDICAL RECORD NO.:  [id number]          PATIENT TYPE:  OIB   LOCATION:  2550                         FACILITY:  [facility]   PHYSICIAN:  Myrtie Neither, MD      DATE OF BIRTH:  10/03/1951   DATE OF PROCEDURE:  11/06/2007  DATE OF DISCHARGE:                               OPERATIVE REPORT   PREOPERATIVE DIAGNOSIS:  Internal derangement right knee.   POSTOPERATIVE DIAGNOSES:  1. Defect medial femoral condyle.  2. Defect medial tibial plateau surface.  3. Depressed tibial plateau fracture.  4. Partial medial meniscus tear.  5. Synovitis.   ANESTHESIA:  General.   PROCEDURE:  Arthroscopic chondroplasty medial femoral condyle and medial  tibial plateau surface, partial medial meniscectomy and synovectomy.   The patient was taken to the operating room after given adequate preop  medications, given general anesthesia and intubated.  Right lower  extremity was prepped with DuraPrep and draped in a sterile manner.  Tourniquet was used for hemostasis.  A 1/2 inch puncture wound was made  along the anterior medial and lateral joint line.  Inflow port was  through the medial suprapatellar pouch area.  Inspection of the joint  revealed large, silver-dollar size chondral defect involving the medial  femoral condyle which articular surface partially peeled off, depressed  tibial plateau fracture medially with chondral defect also of the tibial  plateau surface.  The medial meniscus had frayed, torn edges about the  anterolateral and posterior borders.  With synovial shaver, synovectomy  was done followed by a chondroplasty of the medial femoral condyle, and  tibial plateau surface loose bodies were also removed.  With the basket  forceps, partial meniscectomy was then done.  ACL was intact; lateral  compartment was well preserved.  Wound closure was then done 0 nylon; 13  mL of 0.25% Marcaine with epinephrine was injected.  Compressive  dressing was applied.  Knee immobilizer applied.  The patient tolerated  the procedure quite well and went to the recovery room in stable and  satisfactory condition.  The patient is being admitted for 23 hour  observation for pain control and instructions on nonweightbearing of the  right lower extremity.  The patient will be discharged home on Percocet  5 mg 1-2 q. 4 p.m. - q. 6 p.r.n. for pain, ice packs, nonweightbearing  on the right side, and to return to the office in 1 week.  The patient  is being discharged in stable and satisfactory condition.      Myrtie Neither, MD  Electronically Signed     AC/MEDQ  D:  11/06/2007  T:  11/06/2007  Job:  [id number]

## 2011-03-10 NOTE — Assessment & Plan Note (Signed)
[facility]                           PRIMARY CARE OFFICE NOTE   NAME:Dun, CORDELLA NYQUIST            MRN:          [id number]  DATE:10/04/2006                            DOB:          [birth year]    Mr. Dovel is a 60 year old woman followed for hypertension and  glaucoma, who presents for followup evaluation and exam.  She was last  seen in the office on July 07, 2005.   The patient's chief concern is that she is due for a mammography, and  secondly she is concerned about possible nail fungus.   PAST MEDICAL HISTORY:   SURGICAL:  1. 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  2. Left salpingectomy and oophorectomy.  No other surgeries reported.   MEDICAL ILLNESSES:  1. Hypertension.  2. Glaucoma, stable and controlled.  3. Uterine fibroids.  4. History of gout flares.   CURRENT MEDICATIONS:  Cosopt eye drops, Alphagan eye drops, vivelle  patch weekly, Norvasc 5 mg daily, Lasix 20 mg daily, Zyrtec 10 mg daily,  Nasonex and Astelin p.r.n.   PHYSICIAN ROSTER:  Dr. Rhinecliff Callas for allergy.  Dr. Nicholas Lose for GYN.  Dr.  Harlon Flor for ophthalmology.   SOCIAL HISTORY:  The patient is retired.  She remains very active.  She  has a very supportive community and family.  Her husband is in good  health.  She has no children.   CHART REVIEW:  Last colonoscopy performed October 18, 2005 with colon  polyps, which were adenomatous in nature and she made a schedule for  followup in 3 years.   Cardiolite study November 22, 2000, which was a negative stress test with  an ejection fraction of 67%.  Last note from Dr. [place] Callas is October 19, 2004.   REVIEW OF SYSTEMS:  Negative for any constitutional, cardiovascular,  respiratory, GI, or GU problems.   EXAMINATION:  Temperature was 97, blood pressure 143/93, pulse 66,  weight 187.  GENERAL APPEARANCE:  This is a well-nourished, well-developed African-  American woman who looks her stated age in no acute distress.  HEENT:  Normocephalic, atraumatic.  EACs and TMs  were unremarkable.  Oropharynx with native dentition in good repair.  No buccal or palate  lesions were noted.  Posterior oropharynx was clear.  Conjunctivae on  the left side is clear with a normal pupil, which is reactive.  Funduscopic exam referred to ophthalmology.  The patient has a  prosthesis in the right eye socket.  NECK:  Supple without thyromegaly.  No lymphadenopathy was noted in the  cervical or supraclavicular regions.  CHEST:  No CVA tenderness.  LUNGS:  Clear to auscultation and percussion.  CARDIOVASCULAR:  2+ radial pulse.  No JVD or carotid bruit.  She had a  quiet precordium with regular rate and rhythm without murmurs, rubs, or  gallops.  BREASTS:  Referred to gynecology.  ABDOMEN:  Soft.  No guarding or rebound.  No organo-splenomegaly noted.  PELVIC:  Deferred to gynecology.  RECTAL:  Deferred to gynecology.  EXTREMITIES:  Without cyanosis, clubbing, edema, or deformity.  NEUROLOGIC:  Nonfocal.   DATABASE:  The patient is sent for a metabolic panel.  Cholesterol is  not repeated since, in October of 2006, it was normal with an LDL of  125.   ASSESSMENT AND PLAN:  1. Hypertension.  The patient is borderline-controlled on her present      medical regimen.  I have asked her to check her blood pressures at      home.  Notify me if they continue to run at this level.  If they      are, I would either increase Norvasc to 10, or increase Lasix to      40.  2. Ophthalm.  The patient is followed by Dr. Harlon Flor for her glaucoma      and is stable.  3. Health maintenance.  The patient is cleared with colorectal cancer      screening.  She follows up with Dr. Nicholas Lose on a regular basis.   SUMMARY:  This is a very pleasant woman who seems medically stable at  this time.  Laboratories are ordered and pending.     Rosalyn Gess Norins, MD  Electronically  Signed    MEN/MedQ  DD: 10/04/2006  DT: 10/04/2006  Job #: [id number]   cc:   Elenore Paddy

## 2011-03-10 NOTE — Discharge Summary (Signed)
[facility]  Patient:    Jennifer Huang, Jennifer Huang                      MRN: [id number] Adm. Date:  02/12/00 Disc. Date: 02/12/00 Attending:  Gretta Cool, M.D. Dictator:   Jeani Sow, R.N., FNP CC:         Rosalyn Gess. Norins, M.D. [facility]                           Discharge Summary  HISTORY OF PRESENT ILLNESS:  Jennifer Huang is a 60 year old, white married female, gravida 1, para 0, who was noted to have a rapidly enlarged uterine leiomyomata, with persistent abnormal uterine bleeding despite conservative treatments with oral contraceptives, and history of hysteroscopy and resection of the endometrium.  In the last year she has also had severe cyclic pelvic pain.  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  She is now admitted for definitive therapy by total abdominal hysterectomy.  It is noted that she had a laparotomy for tubal surgery at [facility], but failed to conceive following this surgery.  PHYSICAL EXAMINATION:  CHEST:  On admission, chest clear to A&P.  HEART:  Rate and rhythm are regular without murmur, gallop, or cardiac enlargement.  ABDOMEN:  Palpitations mass above the symphysis halfway to the umbilicus.  No other organomegaly or palpable masses.  PELVIC:  External genitalia within normal limits for female.  Vagina clean and rugose.  Cervix and parous and clean.  Uterus is approximately 16 weeks size. Pelvic assessment is difficult due to discomfort on examination.  Adnexa nonpalpable.  Rectovaginal exam confirms.  IMPRESSION:  1. Uterine leiomyomata with abnormal uterine bleeding, unresponsive to     conservative therapy, with rapid enlargement.  2. History of infertility with previous laparotomy and tubal surgery at [facility].  3. 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  4. Cyclic pelvic pain.  5. Arthritis.  PLAN:  Exploratory laparotomy, total abdominal hysterectomy, probable bilateral salpingo-oophorectomy under general  anesthesia.  Risks and benefits were discussed with the patient.  She accepts these procedures.  LABORATORY DATA:  Admission hemoglobin 11.6, hematocrit 35.8.  On the first postoperative day, hemoglobin was 10.7, hematocrit 32.6.  Electrocardiogram:  Normal sinus rhythm.  Left atrial enlargement.  Borderline ECG.  HOSPITAL COURSE:  The patient underwent exploratory laparotomy, lysis of extremely extensive omental and bowel adhesions to the uterus and anterior abdominal wall, and to the adnexal structures and the entire pelvis.  Total abdominal hysterectomy and bilateral salpingo-oophorectomy under general anesthesia.  The procedures were completed without any complications and the patient was returned to the recovery room in excellent condition.  Estimated blood loss approximately 500 cc.  Pathology report:  Nonspecific acute erosive cervicitis, demetrial with extensive foreign body reaction consistent with previous surgery, associated with rare focus of endometrial Bacillus.  No residual hyperplasia identified, benign 10 cm uterine leiomyomata, associated with nonspecific degenerative changes, fibrous adhesions on the ovaries and the right tube and uterine serosal fibrous adhesions.  Postoperative course was without complications and she was discharged on the second postoperative day in excellent condition.  FINAL DISCHARGE INSTRUCTIONS:  No heavy lifting or straining, no vaginal entrance, and increase ambulation as tolerated.  She is to call for any fever over 100.5 or failure of daily improvement.  PROCEDURES PERFORMED:  Exploratory laparotomy, lysis of extremely extensive omental and bowel adhesions to the uterus, anterior abdominal wall, adnexal  DIET:  Regular.  MEDICATIONS:  1. Vioxx 25 mg b.i.d.  2. Tylox 1 p.o. q.4-6h. p.r.n. discomfort.  FOLLOWUP:  She is to return to the office in one week for followup.  CONDITION ON DISCHARGE:  Excellent.  FINAL DISCHARGE  DIAGNOSES:  1. Leiomyomata with abnormal uterine bleeding and pelvic pain.  2. Extremely severe omental and bowel adhesions, with severe previous pelvic     inflammatory process.  PROCEDURES PERFORMED:  Exploratory laparotomy, lysis of extremely extensive omental and bowel adhesions to the uterus, anterior abdominal wall, adnexal structures and the entire pelvis; total abdominal hysterectomy and bilateral salpingo-oophorectomy under general anesthesia. DD:  03/20/00 TD:  03/21/00 Job: [id number]

## 2011-07-12 ENCOUNTER — Other Ambulatory Visit: Payer: Self-pay | Admitting: Orthopedic Surgery

## 2011-07-12 ENCOUNTER — Ambulatory Visit
Admission: RE | Admit: 2011-07-12 | Discharge: 2011-07-12 | Disposition: A | Discharge status: Home / Self Care | Payer: Medicare Other | Source: Ambulatory Visit | Attending: Orthopedic Surgery | Admitting: Orthopedic Surgery

## 2011-07-12 DIAGNOSIS — M1990 Unspecified osteoarthritis, unspecified site: Secondary | ICD-10-CM

## 2011-07-13 LAB — URINALYSIS, ROUTINE W REFLEX MICROSCOPIC
Bilirubin Urine: NEGATIVE
Glucose, UA: NEGATIVE
Hgb urine dipstick: NEGATIVE
Specific Gravity, Urine: 1.022
pH: 6

## 2011-07-13 LAB — COMPREHENSIVE METABOLIC PANEL
AST: 19
Albumin: 4.3
Calcium: 9.6
Chloride: 102
Creatinine, Ser: 0.86
GFR calc Af Amer: >60
Total Protein: 7.5

## 2011-07-13 LAB — CBC
MCHC: 33.9
MCV: 85.8
Platelets: 448 — ABNORMAL HIGH
WBC: 12.8 — ABNORMAL HIGH

## 2011-10-02 ENCOUNTER — Ambulatory Visit (INDEPENDENT_AMBULATORY_CARE_PROVIDER_SITE_OTHER): Payer: Medicare Other | Admitting: Internal Medicine

## 2011-10-02 DIAGNOSIS — N6459 Other signs and symptoms in breast: Secondary | ICD-10-CM

## 2011-10-02 DIAGNOSIS — R6889 Other general symptoms and signs: Secondary | ICD-10-CM

## 2011-10-02 MED ORDER — ALPRAZOLAM 0.5 MG PO TABS: 0.5000 mg | ORAL_TABLET | Freq: Three times a day (TID) | ORAL | Status: AC | PRN

## 2011-10-03 ENCOUNTER — Encounter: Payer: Self-pay | Admitting: Internal Medicine

## 2011-10-03 DIAGNOSIS — N6459 Other signs and symptoms in breast: Secondary | ICD-10-CM | POA: Insufficient documentation

## 2011-10-03 NOTE — Assessment & Plan Note (Signed)
Persistent inverted nipple. Exam today is unremarkable for any other finding.  Plan- routine mammography, next due April '13.          Monthly self breast exam with earlier studies if any abnormality detected.

## 2011-10-03 NOTE — Progress Notes (Signed)
  Subjective:    Patient ID: Jennifer Huang, female    DOB: 02-23-1951, 60 y.o.   MRN: [id number]  HPI At her last CPX in March she was noted to have an inverted left nipple. Subsequent mammography, including additional views, was negative. Her nipple has remained inverted and she will occasionally have minor discomfort in the areola. There has been no nipple discharge, no change in skin texture and no lumps.  I have reviewed the patient's medical history in detail and updated the computerized patient record.    Review of Systems System review is negative for any constitutional, cardiac, pulmonary, GI or neuro symptoms or complaints other than as described in the HPI.     Objective:   Physical Exam Vitals - stable Gen'l - WNWD AA woman in no distress Pulm - normal respirations Cor- RRR Breast exam,left - skin is normal, inverted nipple with no discharge, normal areola, no fixed mass or lesion noted, no axillary adenopathy       Assessment & Plan:

## 2012-06-03 ENCOUNTER — Encounter: Payer: Self-pay | Admitting: Internal Medicine

## 2012-10-17 ENCOUNTER — Ambulatory Visit (INDEPENDENT_AMBULATORY_CARE_PROVIDER_SITE_OTHER)
Admission: RE | Admit: 2012-10-17 | Discharge: 2012-10-17 | Disposition: A | Discharge status: Home / Self Care | Payer: 59 | Source: Ambulatory Visit

## 2012-10-17 ENCOUNTER — Encounter: Payer: Self-pay | Admitting: Internal Medicine

## 2012-10-17 ENCOUNTER — Ambulatory Visit (INDEPENDENT_AMBULATORY_CARE_PROVIDER_SITE_OTHER): Payer: Medicare Other | Admitting: Internal Medicine

## 2012-10-17 ENCOUNTER — Other Ambulatory Visit: Payer: 59

## 2012-10-17 ENCOUNTER — Other Ambulatory Visit (INDEPENDENT_AMBULATORY_CARE_PROVIDER_SITE_OTHER): Payer: 59

## 2012-10-17 VITALS — BP 148/72 | HR 72 | Temp 98.2°F | Resp 12 | Ht 67.0 in | Wt 167.2 lb

## 2012-10-17 DIAGNOSIS — Z0000 Encounter for general adult medical examination without abnormal findings: Secondary | ICD-10-CM

## 2012-10-17 DIAGNOSIS — E7800 Pure hypercholesterolemia, unspecified: Secondary | ICD-10-CM | POA: Insufficient documentation

## 2012-10-17 DIAGNOSIS — I10 Essential (primary) hypertension: Secondary | ICD-10-CM

## 2012-10-17 DIAGNOSIS — M899 Disorder of bone, unspecified: Secondary | ICD-10-CM

## 2012-10-17 DIAGNOSIS — M8580 Other specified disorders of bone density and structure, unspecified site: Secondary | ICD-10-CM

## 2012-10-17 DIAGNOSIS — Z23 Encounter for immunization: Secondary | ICD-10-CM

## 2012-10-17 DIAGNOSIS — M109 Gout, unspecified: Secondary | ICD-10-CM

## 2012-10-17 DIAGNOSIS — H409 Unspecified glaucoma: Secondary | ICD-10-CM

## 2012-10-17 DIAGNOSIS — M949 Disorder of cartilage, unspecified: Secondary | ICD-10-CM

## 2012-10-17 LAB — LIPID PANEL
HDL: 77.5 mg/dL (ref >39.00)
Total CHOL/HDL Ratio: 3
Triglycerides: 74 mg/dL (ref 0.0–149.0)

## 2012-10-17 LAB — COMPREHENSIVE METABOLIC PANEL
ALT: 14 U/L (ref 0–35)
Alkaline Phosphatase: 64 U/L (ref 39–117)
Creatinine, Ser: 0.7 mg/dL (ref 0.4–1.2)
Glucose, Bld: 100 mg/dL — ABNORMAL HIGH (ref 70–99)
Sodium: 138 mEq/L (ref 135–145)
Total Bilirubin: 1 mg/dL (ref 0.3–1.2)
Total Protein: 7.8 g/dL (ref 6.0–8.3)

## 2012-10-17 LAB — URIC ACID: Uric Acid, Serum: 4.2 mg/dL (ref 2.4–7.0)

## 2012-10-17 MED ORDER — SERTRALINE HCL 25 MG PO TABS: 25.0000 mg | ORAL_TABLET | Freq: Every day | ORAL | Status: DC

## 2012-10-17 NOTE — Patient Instructions (Addendum)
Thanks for coming to see me.  Sorry about the trouble with your Glaucoma.  For anxiety and the blues, a contributor to the glaucoma pressure, will start Sertraline (Zoloft) at 25 mg daily - a low dose. I would like to see you in about 4 weeks to see how you do with this medication. It takes about 3 weeks to see an improvement in mood but there is a reduction in anxiety in 2-3 days.  Ask your husband if he notices you breath-holding at night, a sign of sleep apnea that can contribute to your glaucoma problems.  For bone health - you need 1200 mg daily of calcium (diet plus supplement if needed); you need 800-1,000 international units daily of Vitamin D. You need a bone density.  Immunizations: flu and tetanus today (Tdap). You will need to return twice: once for pneumonia vaccine and once for shingles vaccine after you check about insurance coverage for the vaccine.  For routine labs today.  Have a Happy New Year.

## 2012-10-17 NOTE — Progress Notes (Signed)
Subjective:    Patient ID: Jennifer Huang, female    DOB: 01/03/1951, 61 y.o.   MRN: [id number]  HPI The patient is here for annual wellness examination and management of other chronic and acute problems.  Ophthalmology - chronic glaucoma - seen today by ophthalmologist - Dr. Paulina Fusi at [facility]. Rising pressure despite drops. Plan is not clear to her.   Stressed with care-giving responsibility for her 23 y/o mother. Lost a brother recently.  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 Has seen Dr. Montez Morita for this problem. She is also having bilateral knee pain.   The risk factors are reflected in the social history.  The roster of all physicians providing medical care to patient - is listed in the Snapshot section of the chart.  Activities of daily living:  The patient is 100% inedpendent in all ADLs: dressing, toileting, feeding as well as independent mobility  Home safety : The patient has smoke detectors in the home. Fall - one fall during the year. Happened out of doors.They wear seatbelts. No firearms at home. There is no violence in the home.   There is no risks for hepatitis, STDs or HIV. There is no history of blood transfusion. They have no travel history to infectious disease endemic areas of the world.  The patient has  seen their dentist in the last 12 month. They have seen their eye doctor in the last year. They deny any hearing difficulty and have not had audiologic testing in the last year.    They do not  have excessive sun exposure. Discussed the need for sun protection: hats, long sleeves and use of sunscreen if there is significant sun exposure.   Diet: the importance of a healthy diet is discussed. They do have a healthy diet.  The patient has a regular exercise program: walking , 20 min duration, 4 _per week.  The benefits of regular aerobic exercise were disc  Depression screen: there are no signs or vegative symptoms of depression-  irritability, change in appetite, anhedonia, sadness/tearfullness. Willing to take medication.  Cognitive assessment: the patient manages all their financial and personal affairs and is actively engaged.   Past Medical History  Diagnosis Date  . Unspecified glaucoma(365.9)     Left Eye  . Gout, unspecified   . Primary localized osteoarthrosis, lower leg   . Unspecified essential hypertension   . Colon polyps     anal polyp   Past Surgical History  Procedure Date  . Salpingectomy     Had Blockage  . Oophorectomy     Had Blockage  . 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 Total abdominal hysterectomy 2002  . Colon surgery     anal polyp excised gen'l anesthesia   Family History  Problem Relation Age of Onset  . Osteoarthritis Mother   . Hypertension Mother   . Emphysema Father   . Diabetes Father   . Hypertension Father   . Arthritis Sister     hip replacments, wrist surgery  . Hyperlipidemia Sister   . Hypertension Sister   . Diabetes Brother   . Hypertension Brother   . Hyperlipidemia Brother   . Heart disease Brother   . Hypertension Brother   . Hyperlipidemia Brother   . Diabetes Brother   . Heart disease Brother   . Hypertension Brother   . Hyperlipidemia Brother    History   Social History  . Marital Status: Married  Spouse Name: N/A    Number of Children: 0  . Years of Education: N/A   Occupational History  . Homemaker   . SO - self-employed    Social History Main Topics  . Smoking status: Never Smoker   . Smokeless tobacco: Not on file  . Alcohol Use: No  . Drug Use: Not on file  . Sexually Active: Yes -- Female partner(s)   Other Topics Concern  . Not on file   Social History Narrative   HSG, @ year college. Married '73. No children. On disability - orthopedic: knee, back, shortened leg.. SO - self-employed.Dec '12 - many stressors: husband with cancer, brother with pancreatic cancer, housing and financial issues.     Current Outpatient  Prescriptions on File Prior to Visit  Medication Sig Dispense Refill  . amLODipine (NORVASC) 10 MG tablet Take 10 mg by mouth daily.        Marland Kitchen azelastine (ASTELIN) 137 MCG/SPRAY nasal spray 1 spray by Nasal route 2 (two) times daily. Use in each nostril as directed       . brimonidine (ALPHAGAN P) 0.1 % SOLN 3 (three) times daily.        . diclofenac sodium (VOLTAREN) 1 % GEL Apply topically 3 (three) times daily.        . dorzolamide-timolol (COSOPT) 22.3-6.8 MG/ML ophthalmic solution 1 drop 2 (two) times daily.        Marland Kitchen Epinastine HCl (ELESTAT) 0.05 % ophthalmic solution 1 drop 2 (two) times daily.        . furosemide (LASIX) 20 MG tablet Take 20 mg by mouth daily.        . mometasone (NASONEX) 50 MCG/ACT nasal spray 1 spray by Nasal route daily.        . Naproxen Sodium (NAPRELAN) 375 MG TB24 Take 1 tablet by mouth 2 (two) times daily.        . NON FORMULARY Dr Montez Morita has prescribed muscle relaxant. Pt will call w/name of Rx.       . travoprost, benzalkonium, (TRAVATAN) 0.004 % ophthalmic solution 1 drop at bedtime.        . tretinoin microspheres (RETIN-A MICRO) 0.1 % gel Apply topically at bedtime.        . Bepotastine Besilate (BEPREVE) 1.5 % SOLN Apply 1 drop to eye 2 (two) times daily.          During the course of the visit the patient was educated and counseled about appropriate screening and preventive services including : fall prevention , diabetes screening, nutrition counseling, colorectal cancer screening, and recommended immunizations.    Review of Systems System review is negative for any constitutional, cardiac, pulmonary, GI or neuro symptoms or complaints other than as described in the HPI.     Objective:   Physical Exam Filed Vitals:   10/17/12 1338  BP: 148/72  Pulse: 72  Temp: 98.2 F (36.8 C)  Resp: 12   Wt Readings from Last 3 Encounters:  10/17/12 167 lb 3.2 oz (75.841 kg)  10/02/11 161 lb (73.029 kg)  02/16/11 160 lb 12 oz (72.916 kg)   Gen'l- WNWD AA  woman in no distress HEENT - prosthetic right globe; left conjunctiva mildly erythematous, full fundiscopic exam deferred to ophthalmologist. Oropharynx with native dentition in good repair, no buccal or palatal lesions. Neck - supple w/o thyromegaly Nodes - negative Cor- 2+ radial, dorsalis pedis pulses; regular rate and rhythm with no murmur . Pulm - normal respiration with no rales, wheezes  or rhonchi Breast - - Skin normal, nipples w/o discharge, no fixed mass or lesion, no axillary adenopathy. Abdomen - BS+ x 4, soft, no HSM, no guarding or rebound, no masses Pelvic- deferred to previous hysterectomy Extremties - no deformity, no abnormality Derm - clear head, neck, back, arms.  Neuro - CN II-XII normal, normal gait and station, normal cognition  Lab Results  Component Value Date   WBC 6.8 02/16/2011   HGB 11.8* 02/16/2011   HCT 34.8* 02/16/2011   PLT 262.0 02/16/2011   GLUCOSE 100* 10/17/2012   CHOL 220* 10/17/2012   TRIG 74.0 10/17/2012   HDL 77.50 10/17/2012   LDLDIRECT 129.6 10/17/2012   ALT 14 10/17/2012   AST 17 10/17/2012   NA 138 10/17/2012   K 3.5 10/17/2012   CL 100 10/17/2012   CREATININE 0.7 10/17/2012   BUN 20 10/17/2012   CO2 31 10/17/2012   TSH 1.19 11/04/2009   HGBA1C 5.7 10/03/2006            Assessment & Plan:

## 2012-10-19 DIAGNOSIS — Z0000 Encounter for general adult medical examination without abnormal findings: Secondary | ICD-10-CM | POA: Insufficient documentation

## 2012-10-19 NOTE — Assessment & Plan Note (Signed)
BP Readings from Last 3 Encounters:  10/17/12 148/72  10/02/11 122/80  02/16/11 108/62   Mild elevation today with a pattern of good control. She did have a stressful morning.  Plan No change in medications  Monitor BP at home and call if SBP is running 140+ on a regular basis.

## 2012-10-19 NOTE — Assessment & Plan Note (Signed)
Interval medical history significant for continued problems with progressive glaucoma, otherwise normal. Limited physical exam is normal. Lab results are in normal range, including LDL cholesterol which is below goal of 130 or less (NCEP-ATP III). She is current with colorectal cancer screening and mammography. Immunizations are up to date.  In summary - a very nice woman who, except for glaucoma, is medically stable. She will return as needed or in 1 year.

## 2012-10-19 NOTE — Assessment & Plan Note (Signed)
No recent flares of gout. Last Uric Acid Dec '13 - 4.2 - normal range.

## 2012-10-19 NOTE — Assessment & Plan Note (Signed)
Continued problem with rising pressures. Under the care of Ophthalmology.

## 2012-10-31 ENCOUNTER — Telehealth: Payer: Self-pay | Admitting: Internal Medicine

## 2012-10-31 NOTE — Telephone Encounter (Signed)
Request refill of amlodipine 10mg  and furosemide 20mg , please 30 day supply to  [facility] on [address] and 90 day supply to [employer]

## 2012-11-01 ENCOUNTER — Other Ambulatory Visit: Payer: Self-pay | Admitting: *Deleted

## 2012-11-01 MED ORDER — AMLODIPINE BESYLATE 10 MG PO TABS: 10.0000 mg | ORAL_TABLET | Freq: Every day | ORAL | Status: DC

## 2012-11-01 MED ORDER — FUROSEMIDE 20 MG PO TABS: 20.0000 mg | ORAL_TABLET | Freq: Every day | ORAL | Status: DC

## 2012-11-20 ENCOUNTER — Ambulatory Visit: Payer: Medicare Other | Admitting: Internal Medicine

## 2012-12-03 ENCOUNTER — Ambulatory Visit (INDEPENDENT_AMBULATORY_CARE_PROVIDER_SITE_OTHER): Payer: 59 | Admitting: Internal Medicine

## 2012-12-03 ENCOUNTER — Encounter: Payer: Self-pay | Admitting: Internal Medicine

## 2012-12-03 VITALS — BP 136/72 | HR 66 | Temp 99.1°F | Resp 10 | Wt 171.0 lb

## 2012-12-03 DIAGNOSIS — M7551 Bursitis of right shoulder: Secondary | ICD-10-CM

## 2012-12-03 DIAGNOSIS — M67919 Unspecified disorder of synovium and tendon, unspecified shoulder: Secondary | ICD-10-CM

## 2012-12-03 MED ORDER — METHYLPREDNISOLONE ACETATE 40 MG/ML IJ SUSP
40.0000 mg | Freq: Once | INTRAMUSCULAR | Status: AC
  Administered 2012-12-03: 40 mg via INTRAMUSCULAR

## 2012-12-03 NOTE — Progress Notes (Signed)
  Subjective:    Patient ID: Jennifer Huang, female    DOB: 09-Dec-1950, 62 y.o.   MRN: [id number]  HPI [name] presents today for the onset of marked shoulder pain on the right. This started this AM. She Has lost some ROM with abduction. Pain radiates down the proximal UE.   PMH, FamHx and SocHx reviewed for any changes and relevance. \ Current Outpatient Prescriptions on File Prior to Visit  Medication Sig Dispense Refill  . amLODipine (NORVASC) 10 MG tablet Take 1 tablet (10 mg total) by mouth daily.  90 tablet  1  . Bepotastine Besilate (BEPREVE) 1.5 % SOLN Apply 1 drop to eye 2 (two) times daily.        . brimonidine (ALPHAGAN P) 0.1 % SOLN 3 (three) times daily.        . diclofenac sodium (VOLTAREN) 1 % GEL Apply topically 3 (three) times daily.        . dorzolamide-timolol (COSOPT) 22.3-6.8 MG/ML ophthalmic solution 1 drop 2 (two) times daily.        Marland Kitchen Epinastine HCl (ELESTAT) 0.05 % ophthalmic solution 1 drop 2 (two) times daily.        . furosemide (LASIX) 20 MG tablet Take 1 tablet (20 mg total) by mouth daily.  90 tablet  1  . Naproxen Sodium (NAPRELAN) 375 MG TB24 Take 1 tablet by mouth 2 (two) times daily.        . NON FORMULARY Dr Montez Morita has prescribed muscle relaxant. Pt will call w/name of Rx.       . sertraline (ZOLOFT) 25 MG tablet Take 1 tablet (25 mg total) by mouth daily.  30 tablet  11  . travoprost, benzalkonium, (TRAVATAN) 0.004 % ophthalmic solution 1 drop at bedtime.        . tretinoin microspheres (RETIN-A MICRO) 0.1 % gel Apply topically at bedtime.         No current facility-administered medications on file prior to visit.      Review of Systems System review is negative for any constitutional, cardiac, pulmonary, GI or neuro symptoms or complaints other than as described in the HPI.     Objective:   Physical Exam Filed Vitals:   12/03/12 1506  BP: 136/72  Pulse: 66  Temp: 99.1 F (37.3 C)  Resp: 10   Gen'l- WNWD AA woman in no distress Cor  - RRR Pulm - normal respirations. MSK - right shoulder with normal passive ROM; click with movement noted; tendress with flexion.   Procedure bursal injection - right shoulder  Indication - localized pain Consent - informed verbal consent from patient after explanation of risks of bleeding and infection Prep - injection site identified, prepped with betadine followed by alcohol. Med -   40 Mg depomedrol with 0.5 Cc 2% xylocain Injection - bursa entered easily. Injected without difficulty. Patient tolerated this well. Post-procedure - patient with rapid reduction in discomfort. Bandaid applied. Routine precautions provided including instruction to return for fever, drainage or increased pain         Assessment & Plan:  Bursitis right shoulder. Good relief with steroid injection.

## 2012-12-03 NOTE — Patient Instructions (Addendum)
Bursitis right shoulder - good range of motion. You had a cortisone injection today with lidocaine. You should see some return of pain but over the next 6-8 hours there will be improvement and this should last for days.  Ok to use the arm but don't overdue it. Call for problems: redness, swelling, fever.   Bursitis Bursitis is a swelling and soreness (inflammation) of a fluid-filled sac (bursa) that overlies and protects a joint. It can be caused by injury, overuse of the joint, arthritis or infection. The joints most likely to be affected are the elbows, shoulders, hips and knees. HOME CARE INSTRUCTIONS   Apply ice to the affected area for 15 to 20 minutes each hour while awake for 2 days. Put the ice in a plastic bag and place a towel between the bag of ice and your skin.  Rest the injured joint as much as possible, but continue to put the joint through a full range of motion, 4 times per day. (The shoulder joint especially becomes rapidly "frozen" if not used.) When the pain lessens, begin normal slow movements and usual activities.  Only take over-the-counter or prescription medicines for pain, discomfort or fever as directed by your caregiver.  Your caregiver may recommend draining the bursa and injecting medicine into the bursa. This may help the healing process.  Follow all instructions for follow-up with your caregiver. This includes any orthopedic referrals, physical therapy and rehabilitation. Any delay in obtaining necessary care could result in a delay or failure of the bursitis to heal and chronic pain. SEEK IMMEDIATE MEDICAL CARE IF:   Your pain increases even during treatment.  You develop an oral temperature above 102 F (38.9 C) and have heat and inflammation over the involved bursa. MAKE SURE YOU:   Understand these instructions.  Will watch your condition.  Will get help right away if you are not doing well or get worse. Document Released: 10/06/2000 Document Revised:  01/01/2012 Document Reviewed: 09/10/2009 [facility] Patient Information 2013 [place], [place].

## 2014-01-28 ENCOUNTER — Ambulatory Visit
Admission: RE | Admit: 2014-01-28 | Discharge: 2014-01-28 | Disposition: A | Discharge status: Home / Self Care | Payer: Medicare Other | Source: Ambulatory Visit | Attending: Orthopedic Surgery | Admitting: Orthopedic Surgery

## 2014-01-28 ENCOUNTER — Other Ambulatory Visit: Payer: Self-pay | Admitting: Orthopedic Surgery

## 2014-01-28 DIAGNOSIS — M25551 Pain in right hip: Secondary | ICD-10-CM

## 2014-03-17 ENCOUNTER — Other Ambulatory Visit (INDEPENDENT_AMBULATORY_CARE_PROVIDER_SITE_OTHER): Payer: Medicare Other

## 2014-03-17 ENCOUNTER — Encounter: Payer: Self-pay | Admitting: Internal Medicine

## 2014-03-17 ENCOUNTER — Ambulatory Visit (INDEPENDENT_AMBULATORY_CARE_PROVIDER_SITE_OTHER): Payer: Medicare Other | Admitting: Internal Medicine

## 2014-03-17 VITALS — BP 178/100 | HR 64 | Temp 97.9°F | Resp 14 | Wt 171.2 lb

## 2014-03-17 DIAGNOSIS — I10 Essential (primary) hypertension: Secondary | ICD-10-CM

## 2014-03-17 DIAGNOSIS — H409 Unspecified glaucoma: Secondary | ICD-10-CM

## 2014-03-17 DIAGNOSIS — E049 Nontoxic goiter, unspecified: Secondary | ICD-10-CM

## 2014-03-17 LAB — BASIC METABOLIC PANEL
BUN: 19 mg/dL (ref 6–23)
CHLORIDE: 105 meq/L (ref 96–112)
CO2: 28 mEq/L (ref 19–32)
CREATININE: 0.8 mg/dL (ref 0.4–1.2)
Calcium: 9.3 mg/dL (ref 8.4–10.5)
GFR: 90.49 mL/min (ref >60.00)
GLUCOSE: 79 mg/dL (ref 70–99)
POTASSIUM: 4.5 meq/L (ref 3.5–5.1)
Sodium: 140 mEq/L (ref 135–145)

## 2014-03-17 LAB — TSH: TSH: 0.54 u[IU]/mL (ref 0.35–4.50)

## 2014-03-17 LAB — T4, FREE: Free T4: 0.74 ng/dL (ref 0.60–1.60)

## 2014-03-17 MED ORDER — FUROSEMIDE 20 MG PO TABS: 20.0000 mg | ORAL_TABLET | Freq: Every day | ORAL | Status: DC

## 2014-03-17 MED ORDER — AMLODIPINE BESYLATE 10 MG PO TABS: 10.0000 mg | ORAL_TABLET | Freq: Every day | ORAL | Status: DC

## 2014-03-17 NOTE — Patient Instructions (Signed)
Your next office appointment will be determined based upon review of your pending labs. Those instructions will be transmitted to you through My Chart  OR  by mail.    Minimal Blood Pressure Goal= AVERAGE < 140/90;  Ideal is an AVERAGE < 135/85. This AVERAGE should be calculated from @ least 5-7 BP readings taken @ different times of day on different days of week. You should not respond to isolated BP readings , but rather the AVERAGE for that week .Please bring your  blood pressure cuff to office visits to verify that it is reliable.It  can also be checked against the blood pressure device at the pharmacy. Finger or wrist cuffs are not dependable; an arm cuff is. 

## 2014-03-17 NOTE — Progress Notes (Signed)
   Subjective:    Patient ID: Jennifer Huang, female    DOB: 05/04/1951, 63 y.o.   MRN: [id number]  HPI  She's been off her blood pressure medicines for approximately 2 weeks. She stated it was monitored by a [facility] home nurse from her insurance company; she cannot remember the exact recording. She does have a blood pressure cuff but has not been monitoring herself  Her major concern is her glaucoma. 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 Two weeks ago the pressure was 23 in the left eye. She has requested referral to [facility] ophthalmology group Review of Systems  She also describes being under extreme pressure as her mother has Alzheimer's and is having hallucinations.   Chest pain, palpitations, tachycardia, exertional dyspnea, paroxysmal nocturnal dyspnea, claudication or edema are absent.       Objective:   Physical Exam  Gen.: Healthy and well-nourished in appearance. Alert, appropriate and cooperative throughout exam. Appears younger than stated age  Head: Normocephalic without obvious abnormalities  Eyes: No corneal or conjunctival inflammation noted.  Prosthetic right eye. Ears: External  ear exam reveals no significant lesions or deformities. Canals clear .TMs normal. Hearing is grossly normal bilaterally. Nose: External nasal exam reveals no deformity or inflammation. Nasal mucosa are pink and moist. No lesions or exudates noted.   Mouth: Oral mucosa and oropharynx reveal no lesions or exudates. Teeth in good repair. Neck: No deformities, masses, or tenderness noted. Range of motion normal. Thyroid :Firm goiter of the right lobe Lungs: Normal respiratory effort; chest expands symmetrically. Lungs are clear to auscultation without rales, wheezes, or increased work of breathing. Heart: Normal rate and rhythm. Normal S1 and S2. No gallop, click, or rub.Grade 1/2  R base murmur. Abdomen: Bowel sounds normal; abdomen soft and nontender. No masses, organomegaly or  hernias noted. No AAA                      Musculoskeletal/extremities: No deformity or scoliosis noted of  the thoracic or lumbar spine.  No clubbing, cyanosis, edema, or significant extremity  deformity noted. Range of motion normal .Tone & strength normal. Hand joints normal  Fingernail health good.No onycholysis Able to lie down & sit up w/o help. Negative SLR bilaterally Vascular: Carotid, radial artery, dorsalis pedis and  posterior tibial pulses are full and equal. No bruits present. Neurologic: Alert and oriented x3. Deep tendon reflexes symmetrical and normal. No tremor Gait normal .   Skin: Intact without suspicious lesions or rashes. Lymph: No cervical, axillary lymphadenopathy present. Psych: Mood and affect are normal. Normally interactive                                                                                        Assessment & Plan:   #1 uncontrolled blood pressure in the context of having been off her medicines for 2 weeks and with exogenous stress  #2 glaucoma with elevated pressure  #3 probable goiter  See orders

## 2014-03-17 NOTE — Progress Notes (Signed)
Pre visit review using our clinic review tool, if applicable. No additional management support is needed unless otherwise documented below in the visit note. 

## 2014-03-18 ENCOUNTER — Telehealth: Payer: Self-pay | Admitting: Internal Medicine

## 2014-03-18 NOTE — Telephone Encounter (Signed)
Relevant patient education mailed to patient.  

## 2014-05-29 ENCOUNTER — Encounter: Payer: Self-pay | Admitting: Gastroenterology

## 2014-06-15 ENCOUNTER — Telehealth: Payer: Self-pay

## 2014-06-15 NOTE — Telephone Encounter (Signed)
LVM regarding scheduling AWV

## 2014-07-10 ENCOUNTER — Ambulatory Visit (INDEPENDENT_AMBULATORY_CARE_PROVIDER_SITE_OTHER): Payer: Medicare Other | Admitting: Internal Medicine

## 2014-07-10 ENCOUNTER — Encounter: Payer: Self-pay | Admitting: Internal Medicine

## 2014-07-10 VITALS — BP 132/83 | HR 73 | Temp 98.1°F | Resp 18 | Ht 66.0 in | Wt 172.0 lb

## 2014-07-10 DIAGNOSIS — R6889 Other general symptoms and signs: Secondary | ICD-10-CM

## 2014-07-10 DIAGNOSIS — H409 Unspecified glaucoma: Secondary | ICD-10-CM

## 2014-07-10 DIAGNOSIS — Z0000 Encounter for general adult medical examination without abnormal findings: Secondary | ICD-10-CM

## 2014-07-10 DIAGNOSIS — J309 Allergic rhinitis, unspecified: Secondary | ICD-10-CM

## 2014-07-10 DIAGNOSIS — I10 Essential (primary) hypertension: Secondary | ICD-10-CM

## 2014-07-10 DIAGNOSIS — N6459 Other signs and symptoms in breast: Secondary | ICD-10-CM

## 2014-07-10 DIAGNOSIS — Z23 Encounter for immunization: Secondary | ICD-10-CM

## 2014-07-10 DIAGNOSIS — M109 Gout, unspecified: Secondary | ICD-10-CM

## 2014-07-10 MED ORDER — FLUTICASONE PROPIONATE 50 MCG/ACT NA SUSP: 2.0000 | Freq: Every day | NASAL | Status: DC

## 2014-07-10 NOTE — Progress Notes (Signed)
Pre visit review using our clinic review tool, if applicable. No additional management support is needed unless otherwise documented below in the visit note. 

## 2014-07-10 NOTE — Patient Instructions (Addendum)
You can stop taking lasix or furosemide.   The medicine for congestion is flonase and you use 2 puffs in each nostril once a day until your congestion clears. This will not affect the pressures in your eyes.  We have given you the flu shot today.  We will send in the referral for the eye doctor.   Come back in about 6 months to check on your blood pressure.   Exercise to Stay Healthy Exercise helps you become and stay healthy. EXERCISE IDEAS AND TIPS Choose exercises that:  You enjoy.  Fit into your day. You do not need to exercise really hard to be healthy. You can do exercises at a slow or medium level and stay healthy. You can:  Stretch before and after working out.  Try yoga, Pilates, or tai chi.  Lift weights.  Walk fast, swim, jog, run, climb stairs, bicycle, dance, or rollerskate.  Take aerobic classes. Exercises that burn about 150 calories:  Running 1  miles in 15 minutes.  Playing volleyball for 45 to 60 minutes.  Washing and waxing a car for 45 to 60 minutes.  Playing touch football for 45 minutes.  Walking 1  miles in 35 minutes.  Pushing a stroller 1  miles in 30 minutes.  Playing basketball for 30 minutes.  Raking leaves for 30 minutes.  Bicycling 5 miles in 30 minutes.  Walking 2 miles in 30 minutes.  Dancing for 30 minutes.  Shoveling snow for 15 minutes.  Swimming laps for 20 minutes.  Walking up stairs for 15 minutes.  Bicycling 4 miles in 15 minutes.  Gardening for 30 to 45 minutes.  Jumping rope for 15 minutes.  Washing windows or floors for 45 to 60 minutes. Document Released: 11/11/2010 Document Revised: 01/01/2012 Document Reviewed: 11/11/2010 [facility] Patient Information 2015 [place], [place]. This information is not intended to replace advice given to you by your health care provider. Make sure you discuss any questions you have with your health care provider.

## 2014-07-10 NOTE — Assessment & Plan Note (Signed)
Has not had in years and not on controller. Can consider starting if additional flare.

## 2014-07-10 NOTE — Assessment & Plan Note (Signed)
Patient is on amlodipine and gets side effect of cramping with lasix (not likely contributing much to her BP control. Will trial off and follow at home. If elevated she will call the office.  Filed Vitals:   07/10/14 1312  BP: 132/83  Pulse: 73  Temp: 98.1 F (36.7 C)  TempSrc: Oral  Resp: 18  Height: 5\' 6"  (1.676 m)  Weight: 172 lb (78.019 kg)  SpO2: 95%

## 2014-07-10 NOTE — Progress Notes (Signed)
   Subjective:    Patient ID: Jennifer Huang, female    DOB: December 13, 1950, 63 y.o.   MRN: [id number]  HPI The patient is a 64 YO female who is coming in to establish care. 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 She has not had an attack of gout in many years and is not on a controller medicine. She has some congestion right now that is causing her to have drainage down her throat. She denies fevers, headache, sinus tenderness, cough, sore throat. She does not have any other complaints except that she does not like her eye doctor and she wants a different one. She denies chest pains, SOB, nausea, diarrhea, constipation, abdominal pain. She cares for her 32 YO mother and her husband who has cancer and has little time to herself and this has been a struggle even to get in to the office today.  Review of Systems  Constitutional: Negative for fever, chills, activity change, appetite change and fatigue.  HENT: Positive for congestion. Negative for ear discharge, ear pain, postnasal drip, rhinorrhea, sinus pressure, sore throat and voice change.   Eyes: Negative.   Respiratory: Negative for cough, chest tightness, shortness of breath and wheezing.   Cardiovascular: Negative for chest pain, palpitations and leg swelling.  Gastrointestinal: Negative for abdominal pain, diarrhea, constipation and abdominal distention.  Endocrine: Negative.   Musculoskeletal: Negative for arthralgias, gait problem and myalgias.  Skin: Negative.   Neurological: Negative for dizziness, weakness, light-headedness and headaches.      Objective:   Physical Exam  Constitutional: She is oriented to person, place, and time. She appears well-developed and well-nourished. No distress.  HENT:  Head: Normocephalic and atraumatic.  Eyes: EOM are normal.  Right eye is not real  Neck: Normal range of motion.  Cardiovascular: Normal rate and regular rhythm.   No murmur heard. Pulmonary/Chest:  Effort normal and breath sounds normal. No respiratory distress. She has no wheezes. She has no rales.  Abdominal: Soft. Bowel sounds are normal. She exhibits no distension. There is no tenderness. There is no rebound.  Neurological: She is alert and oriented to person, place, and time.  Skin: Skin is warm and dry.   Filed Vitals:   07/10/14 1312  BP: 132/83  Pulse: 73  Temp: 98.1 F (36.7 C)  TempSrc: Oral  Resp: 18  Height: 5\' 6"  (1.676 m)  Weight: 172 lb (78.019 kg)  SpO2: 95%      Assessment & Plan:  Patient given the flu shot.

## 2014-07-10 NOTE — Assessment & Plan Note (Signed)
Flonase for congestion.

## 2014-07-10 NOTE — Assessment & Plan Note (Signed)
Flu shot given today

## 2014-07-10 NOTE — Assessment & Plan Note (Signed)
Referral to eye doctor as she is not pleased with her current. 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

## 2014-07-10 NOTE — Assessment & Plan Note (Signed)
Reminded that she is due for mammogram.

## 2014-08-18 ENCOUNTER — Encounter: Payer: Self-pay | Admitting: Internal Medicine

## 2014-08-24 ENCOUNTER — Encounter: Payer: Self-pay | Admitting: Internal Medicine

## 2014-10-08 ENCOUNTER — Encounter: Payer: Self-pay | Admitting: Internal Medicine

## 2014-10-23 HISTORY — PX: COLONOSCOPY: SHX174

## 2014-10-23 HISTORY — PX: POLYPECTOMY: SHX149

## 2014-11-02 DIAGNOSIS — H4011X2 Primary open-angle glaucoma, moderate stage: Secondary | ICD-10-CM | POA: Diagnosis not present

## 2014-11-19 ENCOUNTER — Encounter: Payer: Self-pay | Admitting: Internal Medicine

## 2014-12-14 DIAGNOSIS — M25572 Pain in left ankle and joints of left foot: Secondary | ICD-10-CM | POA: Diagnosis not present

## 2014-12-14 DIAGNOSIS — M1712 Unilateral primary osteoarthritis, left knee: Secondary | ICD-10-CM | POA: Diagnosis not present

## 2014-12-14 DIAGNOSIS — M25562 Pain in left knee: Secondary | ICD-10-CM | POA: Diagnosis not present

## 2014-12-14 DIAGNOSIS — M25561 Pain in right knee: Secondary | ICD-10-CM | POA: Diagnosis not present

## 2014-12-14 DIAGNOSIS — M1711 Unilateral primary osteoarthritis, right knee: Secondary | ICD-10-CM | POA: Diagnosis not present

## 2015-01-08 ENCOUNTER — Ambulatory Visit (INDEPENDENT_AMBULATORY_CARE_PROVIDER_SITE_OTHER): Payer: Medicare Other | Admitting: Internal Medicine

## 2015-01-08 ENCOUNTER — Other Ambulatory Visit (INDEPENDENT_AMBULATORY_CARE_PROVIDER_SITE_OTHER): Payer: Medicare Other

## 2015-01-08 ENCOUNTER — Encounter: Payer: Self-pay | Admitting: Internal Medicine

## 2015-01-08 VITALS — BP 144/88 | HR 80 | Temp 98.3°F | Resp 14 | Ht 66.0 in | Wt 172.0 lb

## 2015-01-08 DIAGNOSIS — E7800 Pure hypercholesterolemia, unspecified: Secondary | ICD-10-CM

## 2015-01-08 DIAGNOSIS — Z0000 Encounter for general adult medical examination without abnormal findings: Secondary | ICD-10-CM

## 2015-01-08 DIAGNOSIS — J3089 Other allergic rhinitis: Secondary | ICD-10-CM | POA: Diagnosis not present

## 2015-01-08 DIAGNOSIS — I10 Essential (primary) hypertension: Secondary | ICD-10-CM

## 2015-01-08 LAB — LIPID PANEL
CHOL/HDL RATIO: 3
Cholesterol: 226 mg/dL — ABNORMAL HIGH (ref 0–200)
HDL: 72.5 mg/dL (ref >39.00)
LDL CALC: 134 mg/dL — AB (ref 0–99)
NonHDL: 153.5
TRIGLYCERIDES: 100 mg/dL (ref 0.0–149.0)
VLDL: 20 mg/dL (ref 0.0–40.0)

## 2015-01-08 LAB — BASIC METABOLIC PANEL
BUN: 13 mg/dL (ref 6–23)
CALCIUM: 9.7 mg/dL (ref 8.4–10.5)
CHLORIDE: 103 meq/L (ref 96–112)
CO2: 30 mEq/L (ref 19–32)
Creatinine, Ser: 0.73 mg/dL (ref 0.40–1.20)
GFR: 103.21 mL/min (ref >60.00)
Glucose, Bld: 109 mg/dL — ABNORMAL HIGH (ref 70–99)
Potassium: 3.9 mEq/L (ref 3.5–5.1)
SODIUM: 137 meq/L (ref 135–145)

## 2015-01-08 MED ORDER — AMLODIPINE BESYLATE 10 MG PO TABS: 10.0000 mg | ORAL_TABLET | Freq: Every day | ORAL | Status: DC

## 2015-01-08 NOTE — Patient Instructions (Signed)
We will check the blood work today and call you back about the results. We have sent in the refill of the blood pressure medicine.   We will see you back in about 6 months. If you have problems or questions before then please feel free to call us.

## 2015-01-08 NOTE — Progress Notes (Signed)
Pre visit review using our clinic review tool, if applicable. No additional management support is needed unless otherwise documented below in the visit note. 

## 2015-01-11 ENCOUNTER — Other Ambulatory Visit: Payer: Medicare Other

## 2015-01-11 DIAGNOSIS — E038 Other specified hypothyroidism: Secondary | ICD-10-CM | POA: Diagnosis not present

## 2015-01-12 LAB — TSH: TSH: 0.869 u[IU]/mL (ref 0.350–4.500)

## 2015-01-12 NOTE — Assessment & Plan Note (Signed)
BP mildly elevated today and she has not taken this morning. Will leave regimen the same for now (amlodipine 10 mg daily). Ordering blood work today to check kidney function.

## 2015-01-12 NOTE — Assessment & Plan Note (Signed)
Not currently on medications. She has been managing with diet and exercise. Will check lipid panel today to see how she is doing.

## 2015-01-12 NOTE — Assessment & Plan Note (Signed)
Doing well on flonase. Will not change regimen today.

## 2015-01-12 NOTE — Progress Notes (Signed)
   Subjective:    Patient ID: Jennifer Huang, female    DOB: 25-Jul-1951, 64 y.o.   MRN: [id number]  HPI The patient is a 64 YO female who comes in to follow up on her hypertension (doing a little worse today, taking amlodipine 10 mg), hyperlipidemia (about the same, not on medication but diet good and walking more), and her allergic rhinitis (doing well now, using flonase). Taking her medications as prescribed and doing well, denies side effects. Denies headaches, chest pains.   Review of Systems  Constitutional: Negative for fever, activity change, appetite change, fatigue and unexpected weight change.  HENT: Negative.   Respiratory: Negative.   Cardiovascular: Negative.   Neurological: Negative.       Objective:   Physical Exam  Constitutional: She is oriented to person, place, and time. She appears well-developed and well-nourished.  HENT:  Head: Normocephalic and atraumatic.  Right Ear: External ear normal.  Left Ear: External ear normal.  Eyes: EOM are normal.  Cardiovascular: Normal rate and regular rhythm.   Pulmonary/Chest: Effort normal and breath sounds normal.  Neurological: She is alert and oriented to person, place, and time.    Filed Vitals:   01/08/15 1436  BP: 144/88  Pulse: 80  Temp: 98.3 F (36.8 C)  TempSrc: Oral  Resp: 14  Height: 5\' 6"  (1.676 m)  Weight: 172 lb (78.019 kg)  SpO2: 93%      Assessment & Plan:

## 2015-01-28 DIAGNOSIS — H4011X2 Primary open-angle glaucoma, moderate stage: Secondary | ICD-10-CM | POA: Diagnosis not present

## 2015-02-18 DIAGNOSIS — H4011X2 Primary open-angle glaucoma, moderate stage: Secondary | ICD-10-CM | POA: Diagnosis not present

## 2015-04-29 ENCOUNTER — Telehealth: Payer: Self-pay | Admitting: Internal Medicine

## 2015-04-29 NOTE — Telephone Encounter (Signed)
Patient stated that Dr Doug Sou called her asked her to called back, please advise

## 2015-04-30 NOTE — Telephone Encounter (Signed)
No phone notes indicating that someone had called.

## 2015-07-30 ENCOUNTER — Encounter: Payer: Self-pay | Admitting: Gastroenterology

## 2015-09-24 ENCOUNTER — Ambulatory Visit (AMBULATORY_SURGERY_CENTER): Payer: Self-pay | Admitting: *Deleted

## 2015-09-24 VITALS — Ht 66.0 in | Wt 175.0 lb

## 2015-09-24 DIAGNOSIS — Z86010 Personal history of colonic polyps: Secondary | ICD-10-CM

## 2015-09-24 MED ORDER — NA SULFATE-K SULFATE-MG SULF 17.5-3.13-1.6 GM/177ML PO SOLN: 1.0000 | Freq: Once | ORAL | Status: DC

## 2015-09-24 NOTE — Progress Notes (Signed)
No egg or soy allergy known to patient  No issues with past sedation with any surgeries  or procedures, no intubation problems  No diet pills No home 02 use per patient   No e mail to send emmi video   Pt states she vomited her prep last colon to the point she couldn't stand it and asked for the gatorade prep. miralax given as requested

## 2015-09-27 ENCOUNTER — Encounter: Payer: Self-pay | Admitting: Gastroenterology

## 2015-10-01 ENCOUNTER — Encounter: Payer: Self-pay | Admitting: Gastroenterology

## 2015-10-01 ENCOUNTER — Ambulatory Visit (AMBULATORY_SURGERY_CENTER): Payer: Medicare Other | Admitting: Gastroenterology

## 2015-10-01 VITALS — BP 134/79 | HR 59 | Temp 98.0°F | Resp 16 | Ht 66.0 in | Wt 175.0 lb

## 2015-10-01 DIAGNOSIS — Z86010 Personal history of colonic polyps: Secondary | ICD-10-CM

## 2015-10-01 DIAGNOSIS — D128 Benign neoplasm of rectum: Secondary | ICD-10-CM

## 2015-10-01 DIAGNOSIS — D123 Benign neoplasm of transverse colon: Secondary | ICD-10-CM

## 2015-10-01 DIAGNOSIS — D129 Benign neoplasm of anus and anal canal: Secondary | ICD-10-CM

## 2015-10-01 MED ORDER — SODIUM CHLORIDE 0.9 % IV SOLN: 500.0000 mL | INTRAVENOUS | Status: DC

## 2015-10-01 NOTE — Op Note (Signed)
[facility]  [address] [place], [address]   COLONOSCOPY PROCEDURE REPORT  PATIENT: Jennifer Huang, Jennifer Huang  MR#: [id number] BIRTHDATE: 02/05/1951 , 55  yrs. old GENDER: female ENDOSCOPIST: Milus Banister, MD PROCEDURE DATE:  10/01/2015 PROCEDURE:   Colonoscopy, surveillance and Colonoscopy with snare polypectomy First Screening Colonoscopy - Avg.  risk and is 50 yrs.  old or older - No.  Prior Negative Screening - Now for repeat screening. N/A  History of Adenoma - Now for follow-up colonoscopy & has been > or = to 3 yrs.  Yes hx of adenoma.  Has been 3 or more years since last colonoscopy.  Polyps removed today? Yes ASA CLASS:   Class II INDICATIONS:Surveillance due to prior colonic neoplasia and colonoscopy 2006 (single small TA), colonoscopy 2011 no TAs. MEDICATIONS: Monitored anesthesia care and Propofol 200 mg IV  DESCRIPTION OF PROCEDURE:   After the risks benefits and alternatives of the procedure were thoroughly explained, informed consent was obtained.  The digital rectal exam revealed no abnormalities of the rectum.   The LB [id number]  endoscope was introduced through the anus and advanced to the cecum, which was identified by both the appendix and ileocecal valve. No adverse events experienced.   The quality of the prep was excellent.  The instrument was then slowly withdrawn as the colon was fully examined. Estimated blood loss is zero unless otherwise noted in this procedure report.  COLON FINDINGS: Two sessile polyps ranging between 3-7mm in size were found in the rectum and transverse colon.  Polypectomies were performed with a cold snare.  The resection was complete, the polyp tissue was completely retrieved and sent to histology.   The examination was otherwise normal.  Retroflexed views revealed no abnormalities. The time to cecum = 3.1 Withdrawal time = 10.8   The scope was withdrawn and the procedure completed. COMPLICATIONS: There  were no immediate complications.  ENDOSCOPIC IMPRESSION: 1.   Two sessile polyps ranging between 3-57mm in size were found in the rectum and transverse colon; polypectomies were performed with a cold snare 2.   The examination was otherwise normal  RECOMMENDATIONS: If the polyp(s) removed today are proven to be adenomatous (pre-cancerous) polyps, you will need a repeat colonoscopy in 5 years.  Otherwise you should continue to follow colorectal cancer screening guidelines for "routine risk" patients with colonoscopy in 10 years.  You will receive a letter within 1-2 weeks with the results of your biopsy as well as final recommendations.  Please call my office if you have not received a letter after 3 weeks.  eSigned:  Milus Banister, MD 10/01/2015 8:27 AM   cc: Pricilla Holm, MD

## 2015-10-01 NOTE — Progress Notes (Signed)
Called to room to assist during endoscopic procedure.  Patient ID and intended procedure confirmed with present staff. Received instructions for my participation in the procedure from the performing physician.  

## 2015-10-01 NOTE — Patient Instructions (Signed)
YOU HAD AN ENDOSCOPIC PROCEDURE TODAY AT THE [facility]:   Refer to the procedure report that was given to you for any specific questions about what was found during the examination.  If the procedure report does not answer your questions, please call your gastroenterologist to clarify.  If you requested that your care partner not be given the details of your procedure findings, then the procedure report has been included in a sealed envelope for you to review at your convenience later.  YOU SHOULD EXPECT: Some feelings of bloating in the abdomen. Passage of more gas than usual.  Walking can help get rid of the air that was put into your GI tract during the procedure and reduce the bloating. If you had a lower endoscopy (such as a colonoscopy or flexible sigmoidoscopy) you may notice spotting of blood in your stool or on the toilet paper. If you underwent a bowel prep for your procedure, you may not have a normal bowel movement for a few days.  Please Note:  You might notice some irritation and congestion in your nose or some drainage.  This is from the oxygen used during your procedure.  There is no need for concern and it should clear up in a day or so.  SYMPTOMS TO REPORT IMMEDIATELY:   Following lower endoscopy (colonoscopy or flexible sigmoidoscopy):  Excessive amounts of blood in the stool  Significant tenderness or worsening of abdominal pains  Swelling of the abdomen that is new, acute  Fever of 100F or higher  For urgent or emergent issues, a gastroenterologist can be reached at any hour by calling [phone].  DIET: Your first meal following the procedure should be a small meal and then it is ok to progress to your normal diet. Heavy or fried foods are harder to digest and may make you feel nauseous or bloated.  Likewise, meals heavy in dairy and vegetables can increase bloating.  Drink plenty of fluids but you should avoid alcoholic beverages for 24 hours.  ACTIVITY:   You should plan to take it easy for the rest of today and you should NOT DRIVE or use heavy machinery until tomorrow (because of the sedation medicines used during the test).    FOLLOW UP: Our staff will call the number listed on your records the next business day following your procedure to check on you and address any questions or concerns that you may have regarding the information given to you following your procedure. If we do not reach you, we will leave a message.  However, if you are feeling well and you are not experiencing any problems, there is no need to return our call.  We will assume that you have returned to your regular daily activities without incident.  If any biopsies were taken you will be contacted by phone or by letter within the next 1-3 weeks.  Please call us at [phone] if you have not heard about the biopsies in 3 weeks.    SIGNATURES/CONFIDENTIALITY: You and/or your care partner have signed paperwork which will be entered into your electronic medical record.  These signatures attest to the fact that that the information above on your After Visit Summary has been reviewed and is understood.  Full responsibility of the confidentiality of this discharge information lies with you and/or your care-partner.  Please read over handout about polyps  Please continue your normal medications   

## 2015-10-01 NOTE — Progress Notes (Signed)
To recovery, repport to [employer], Therapist, sports, VSS.

## 2015-10-04 ENCOUNTER — Telehealth: Payer: Self-pay

## 2015-10-04 NOTE — Telephone Encounter (Signed)
  Follow up Call-  Call back number 10/01/2015  Post procedure Call Back phone  # [phone]  Permission to leave phone message Yes     Patient questions:  Do you have a fever, pain , or abdominal swelling? No. Pain Score  0 *  Have you tolerated food without any problems? Yes.    Have you been able to return to your normal activities? Yes.    Do you have any questions about your discharge instructions: Diet   No. Medications  No. Follow up visit  No.  Do you have questions or concerns about your Care? No.  Actions: * If pain score is 4 or above: No action needed, pain <4.  Per the pt, "I still have a little burning up in there".  I asked where she was burning and she said, "in my rectum".  She said, "but I am ok".  Pt said she has eaten food and has had a bowel movement.  No blood noted.  I advised if not gone in a few  days, to call us back.  Pt said she would.  maw

## 2015-10-06 ENCOUNTER — Encounter: Payer: Self-pay | Admitting: Gastroenterology

## 2015-11-02 ENCOUNTER — Encounter: Payer: Self-pay | Admitting: Internal Medicine

## 2015-12-29 ENCOUNTER — Ambulatory Visit
Admission: RE | Admit: 2015-12-29 | Discharge: 2015-12-29 | Disposition: A | Discharge status: Home / Self Care | Payer: Medicare Other | Source: Ambulatory Visit | Attending: Orthopaedic Surgery | Admitting: Orthopaedic Surgery

## 2015-12-29 ENCOUNTER — Other Ambulatory Visit: Payer: Self-pay | Admitting: Orthopaedic Surgery

## 2015-12-29 DIAGNOSIS — M25551 Pain in right hip: Secondary | ICD-10-CM

## 2015-12-29 DIAGNOSIS — M25562 Pain in left knee: Secondary | ICD-10-CM

## 2015-12-29 DIAGNOSIS — M25561 Pain in right knee: Secondary | ICD-10-CM

## 2015-12-29 DIAGNOSIS — M545 Low back pain: Secondary | ICD-10-CM

## 2016-03-01 ENCOUNTER — Encounter: Payer: Self-pay | Admitting: Internal Medicine

## 2016-03-01 ENCOUNTER — Ambulatory Visit (INDEPENDENT_AMBULATORY_CARE_PROVIDER_SITE_OTHER)
Admission: RE | Admit: 2016-03-01 | Discharge: 2016-03-01 | Disposition: A | Discharge status: Home / Self Care | Payer: Medicare Other | Source: Ambulatory Visit | Attending: Internal Medicine | Admitting: Internal Medicine

## 2016-03-01 ENCOUNTER — Ambulatory Visit (INDEPENDENT_AMBULATORY_CARE_PROVIDER_SITE_OTHER): Payer: Medicare Other | Admitting: Internal Medicine

## 2016-03-01 VITALS — BP 138/80 | HR 71 | Resp 20 | Wt 174.0 lb

## 2016-03-01 DIAGNOSIS — I10 Essential (primary) hypertension: Secondary | ICD-10-CM

## 2016-03-01 DIAGNOSIS — M25532 Pain in left wrist: Secondary | ICD-10-CM

## 2016-03-01 MED ORDER — NAPROXEN 500 MG PO TABS
500.0000 mg | ORAL_TABLET | Freq: Two times a day (BID) | ORAL | Status: DC
Start: 2016-03-01 — End: 2017-02-03

## 2016-03-01 NOTE — Assessment & Plan Note (Signed)
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

## 2016-03-01 NOTE — Patient Instructions (Signed)
Please take all new medication as prescribed - the anti-inflammatory for pain and swelling  Please continue all other medications as before, and refills have been done if requested.  Please have the pharmacy call with any other refills you may need.  Please keep your appointments with your specialists as you may have planned  Please go to the XRAY Department in the Basement (go straight as you get off the elevator) for the x-ray testing  You will be contacted by phone if any changes need to be made immediately.  Otherwise, you will receive a letter about your results with an explanation, but please check with MyChart first.  Please remember to sign up for MyChart if you have not done so, as this will be important to you in the future with finding out test results, communicating by private email, and scheduling acute appointments online when needed.

## 2016-03-01 NOTE — Progress Notes (Signed)
Pre visit review using our clinic review tool, if applicable. No additional management support is needed unless otherwise documented below in the visit note. 

## 2016-03-01 NOTE — Progress Notes (Signed)
Subjective:    Patient ID: Jennifer Huang, female    DOB: [birth year], 65 y.o.   MRN: [id number]  HPI  Here with acute onset left wrist pain and swelling worst posteriorly, after Twice hitting a hard surface as she was trying to shoo a cat out of the way in cleaning her mothers home who recently passed,  Had some mild to mod immediate pain but no skin changes or immed bony changes.  Pain seems to have become constant and worse overnight, has some stiffness and mild difficulty using hand and wrist today, also with marked swelling not better with ice.  No other truama. Has hx of gout but thinks not this today. No fever. No prior hx of same.  Past Medical History  Diagnosis Date  . Unspecified glaucoma     Left Eye  . Gout, unspecified   . Primary localized osteoarthrosis, lower leg   . Unspecified essential hypertension   . Colon polyps     anal polyp  . Allergy     year around allergies  . Hyperlipidemia    Past Surgical History  Procedure Laterality Date  . Salpingectomy      Had Blockage  . Oophorectomy      Had Blockage  . 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 Total abdominal hysterectomy  2002  . Colon surgery      anal polyp excised gen'l anesthesia  . Colonoscopy  10-14-2010    jacobs-hx TA 2006    reports that she has never smoked. She has never used smokeless tobacco. She reports that she does not drink alcohol or use illicit drugs. family history includes Arthritis in her sister; Cancer - Other in her brother; Colon polyps in her brother and brother; Diabetes in her brother, brother, brother, and father; Emphysema in her father; Heart disease in her brother and brother; Hyperlipidemia in her brother, brother, brother, and sister; Hypertension in her brother, brother, brother, father, mother, and sister; Osteoarthritis in her mother. There is no history of Esophageal cancer, Rectal cancer, or Stomach cancer. Allergies  Allergen Reactions  . Penicillins     REACTION:  rash/hives  . Sulfonamide Derivatives     REACTION: rash/hives   Current Outpatient Prescriptions on File Prior to Visit  Medication Sig Dispense Refill  . amLODipine (NORVASC) 10 MG tablet Take 1 tablet (10 mg total) by mouth daily. 90 tablet 3  . brimonidine (ALPHAGAN P) 0.1 % SOLN 3 (three) times daily.      . dorzolamide-timolol (COSOPT) 22.3-6.8 MG/ML ophthalmic solution 1 drop 2 (two) times daily.      . fluticasone (FLONASE) 50 MCG/ACT nasal spray Place 2 sprays into both nostrils daily. 16 g 6  . travoprost, benzalkonium, (TRAVATAN) 0.004 % ophthalmic solution 1 drop at bedtime.       No current facility-administered medications on file prior to visit.   Review of Systems All otherwise neg per pt     Objective:   Physical Exam BP 138/80 mmHg  Pulse 71  Resp 20  Wt 174 lb (78.926 kg)  SpO2 97% VS noted,  Constitutional: Pt appears in no apparent distress HENT: Head: NCAT.  Right Ear: External ear normal.  Left Ear: External ear normal.  Eyes: . Pupils are equal, round, and reactive to light. Conjunctivae and EOM are normal Neck: Normal range of motion. Neck supple.  Cardiovascular: Normal rate and regular rhythm.   Pulmonary/Chest: Effort normal and breath sounds without rales  or wheezing.  Left hand neurovasc intact Left wrist post with 1-2+ diffuse swelling, tender mild tender, no skin changes, nonfluctuant, no drainage or red streaks Neurological: Pt is alert. Not confused , motor grossly intact Skin: Skin is warm. No rash, no LE edema Psychiatric: Pt behavior is normal. No agitation.     Assessment & Plan:

## 2016-03-01 NOTE — Assessment & Plan Note (Signed)
stable overall by history and exam, recent data reviewed with pt, and pt to continue medical treatment as before,  to f/u any worsening symptoms or concerns BP Readings from Last 3 Encounters:  03/01/16 138/80  10/01/15 134/79  01/08/15 144/88

## 2016-03-28 ENCOUNTER — Other Ambulatory Visit (INDEPENDENT_AMBULATORY_CARE_PROVIDER_SITE_OTHER): Payer: Medicare Other

## 2016-03-28 ENCOUNTER — Ambulatory Visit (INDEPENDENT_AMBULATORY_CARE_PROVIDER_SITE_OTHER): Payer: Medicare Other | Admitting: Internal Medicine

## 2016-03-28 ENCOUNTER — Encounter: Payer: Self-pay | Admitting: Internal Medicine

## 2016-03-28 VITALS — BP 120/60 | HR 55 | Temp 98.1°F | Ht 66.0 in | Wt 173.5 lb

## 2016-03-28 DIAGNOSIS — Z0000 Encounter for general adult medical examination without abnormal findings: Secondary | ICD-10-CM

## 2016-03-28 DIAGNOSIS — I10 Essential (primary) hypertension: Secondary | ICD-10-CM

## 2016-03-28 DIAGNOSIS — Z23 Encounter for immunization: Secondary | ICD-10-CM

## 2016-03-28 DIAGNOSIS — L989 Disorder of the skin and subcutaneous tissue, unspecified: Secondary | ICD-10-CM

## 2016-03-28 DIAGNOSIS — E7800 Pure hypercholesterolemia, unspecified: Secondary | ICD-10-CM

## 2016-03-28 LAB — LIPID PANEL
CHOL/HDL RATIO: 3
Cholesterol: 210 mg/dL — ABNORMAL HIGH (ref 0–200)
HDL: 70.8 mg/dL (ref >39.00)
LDL Cholesterol: 122 mg/dL — ABNORMAL HIGH (ref 0–99)
NONHDL: 139.08
Triglycerides: 86 mg/dL (ref 0.0–149.0)
VLDL: 17.2 mg/dL (ref 0.0–40.0)

## 2016-03-28 LAB — COMPREHENSIVE METABOLIC PANEL
ALT: 15 U/L (ref 0–35)
AST: 16 U/L (ref 0–37)
Albumin: 4.7 g/dL (ref 3.5–5.2)
Alkaline Phosphatase: 79 U/L (ref 39–117)
BILIRUBIN TOTAL: 0.7 mg/dL (ref 0.2–1.2)
BUN: 18 mg/dL (ref 6–23)
CHLORIDE: 103 meq/L (ref 96–112)
CO2: 30 meq/L (ref 19–32)
Calcium: 9.7 mg/dL (ref 8.4–10.5)
Creatinine, Ser: 0.82 mg/dL (ref 0.40–1.20)
GFR: 89.91 mL/min (ref >60.00)
GLUCOSE: 108 mg/dL — AB (ref 70–99)
POTASSIUM: 4.1 meq/L (ref 3.5–5.1)
Sodium: 139 mEq/L (ref 135–145)
Total Protein: 7.6 g/dL (ref 6.0–8.3)

## 2016-03-28 LAB — CBC
HCT: 36.5 % (ref 36.0–46.0)
Hemoglobin: 12.1 g/dL (ref 12.0–15.0)
MCHC: 33.2 g/dL (ref 30.0–36.0)
MCV: 86 fl (ref 78.0–100.0)
Platelets: 286 10*3/uL (ref 150.0–400.0)
RBC: 4.24 Mil/uL (ref 3.87–5.11)
RDW: 14.1 % (ref 11.5–15.5)
WBC: 8.1 10*3/uL (ref 4.0–10.5)

## 2016-03-28 MED ORDER — AMLODIPINE BESYLATE 10 MG PO TABS: 10.0000 mg | ORAL_TABLET | Freq: Every day | ORAL | Status: DC

## 2016-03-28 NOTE — Patient Instructions (Addendum)
Call [employer] to see if they want you to get the shingles shot at our office or at the pharmacy.   We have given you the pneumonia shot today and you will get another next year.   We have put in a referral for you to see the dermatologist.   Health Maintenance, Female Adopting a healthy lifestyle and getting preventive care can go a long way to promote health and wellness. Talk with your health care provider about what schedule of regular examinations is right for you. This is a good chance for you to check in with your provider about disease prevention and staying healthy. In between checkups, there are plenty of things you can do on your own. Experts have done a lot of research about which lifestyle changes and preventive measures are most likely to keep you healthy. Ask your health care provider for more information. WEIGHT AND DIET  Eat a healthy diet  Be sure to include plenty of vegetables, fruits, low-fat dairy products, and lean protein.  Do not eat a lot of foods high in solid fats, added sugars, or salt.  Get regular exercise. This is one of the most important things you can do for your health.  Most adults should exercise for at least 150 minutes each week. The exercise should increase your heart rate and make you sweat (moderate-intensity exercise).  Most adults should also do strengthening exercises at least twice a week. This is in addition to the moderate-intensity exercise.  Maintain a healthy weight  Body mass index (BMI) is a measurement that can be used to identify possible weight problems. It estimates body fat based on height and weight. Your health care provider can help determine your BMI and help you achieve or maintain a healthy weight.  For females 68 years of age and older:   A BMI below 18.5 is considered underweight.  A BMI of 18.5 to 24.9 is normal.  A BMI of 25 to 29.9 is considered overweight.  A BMI of 30 and above is considered obese.  Watch levels  of cholesterol and blood lipids  You should start having your blood tested for lipids and cholesterol at 65 years of age, then have this test every 5 years.  You may need to have your cholesterol levels checked more often if:  Your lipid or cholesterol levels are high.  You are older than 65 years of age.  You are at high risk for heart disease.  CANCER SCREENING   Lung Cancer  Lung cancer screening is recommended for adults 18-54 years old who are at high risk for lung cancer because of a history of smoking.  A yearly low-dose CT scan of the lungs is recommended for people who:  Currently smoke.  Have quit within the past 15 years.  Have at least a 30-pack-year history of smoking. A pack year is smoking an average of one pack of cigarettes a day for 1 year.  Yearly screening should continue until it has been 15 years since you quit.  Yearly screening should stop if you develop a health problem that would prevent you from having lung cancer treatment.  Breast Cancer  Practice breast self-awareness. This means understanding how your breasts normally appear and feel.  It also means doing regular breast self-exams. Let your health care provider know about any changes, no matter how small.  If you are in your 20s or 30s, you should have a clinical breast exam (CBE) by a health care provider  every 1-3 years as part of a regular health exam.  If you are 25 or older, have a CBE every year. Also consider having a breast X-ray (mammogram) every year.  If you have a family history of breast cancer, talk to your health care provider about genetic screening.  If you are at high risk for breast cancer, talk to your health care provider about having an MRI and a mammogram every year.  Breast cancer gene (BRCA) assessment is recommended for women who have family members with BRCA-related cancers. BRCA-related cancers include:  Breast.  Ovarian.  Tubal.  Peritoneal  cancers.  Results of the assessment will determine the need for genetic counseling and BRCA1 and BRCA2 testing. Cervical Cancer Your health care provider may recommend that you be screened regularly for cancer of the pelvic organs (ovaries, uterus, and vagina). This screening involves a pelvic examination, including checking for microscopic changes to the surface of your cervix (Pap test). You may be encouraged to have this screening done every 3 years, beginning at age 74.  For women ages 54-65, health care providers may recommend pelvic exams and Pap testing every 3 years, or they may recommend the Pap and pelvic exam, combined with testing for human papilloma virus (HPV), every 5 years. Some types of HPV increase your risk of cervical cancer. Testing for HPV may also be done on women of any age with unclear Pap test results.  Other health care providers may not recommend any screening for nonpregnant women who are considered low risk for pelvic cancer and who do not have symptoms. Ask your health care provider if a screening pelvic exam is right for you.  If you have had past treatment for cervical cancer or a condition that could lead to cancer, you need Pap tests and screening for cancer for at least 20 years after your treatment. If Pap tests have been discontinued, your risk factors (such as having a new sexual partner) need to be reassessed to determine if screening should resume. Some women have medical problems that increase the chance of getting cervical cancer. In these cases, your health care provider may recommend more frequent screening and Pap tests. Colorectal Cancer  This type of cancer can be detected and often prevented.  Routine colorectal cancer screening usually begins at 65 years of age and continues through 66 years of age.  Your health care provider may recommend screening at an earlier age if you have risk factors for colon cancer.  Your health care provider may also  recommend using home test kits to check for hidden blood in the stool.  A small camera at the end of a tube can be used to examine your colon directly (sigmoidoscopy or colonoscopy). This is done to check for the earliest forms of colorectal cancer.  Routine screening usually begins at age 48.  Direct examination of the colon should be repeated every 5-10 years through 65 years of age. However, you may need to be screened more often if early forms of precancerous polyps or small growths are found. Skin Cancer  Check your skin from head to toe regularly.  Tell your health care provider about any new moles or changes in moles, especially if there is a change in a mole's shape or color.  Also tell your health care provider if you have a mole that is larger than the size of a pencil eraser.  Always use sunscreen. Apply sunscreen liberally and repeatedly throughout the day.  Protect yourself by  wearing long sleeves, pants, a wide-brimmed hat, and sunglasses whenever you are outside. HEART DISEASE, DIABETES, AND HIGH BLOOD PRESSURE   High blood pressure causes heart disease and increases the risk of stroke. High blood pressure is more likely to develop in:  People who have blood pressure in the high end of the normal range (130-139/85-89 mm Hg).  People who are overweight or obese.  People who are African American.  If you are 24-43 years of age, have your blood pressure checked every 3-5 years. If you are 12 years of age or older, have your blood pressure checked every year. You should have your blood pressure measured twice--once when you are at a hospital or clinic, and once when you are not at a hospital or clinic. Record the average of the two measurements. To check your blood pressure when you are not at a hospital or clinic, you can use:  An automated blood pressure machine at a pharmacy.  A home blood pressure monitor.  If you are between 64 years and 9 years old, ask your health  care provider if you should take aspirin to prevent strokes.  Have regular diabetes screenings. This involves taking a blood sample to check your fasting blood sugar level.  If you are at a normal weight and have a low risk for diabetes, have this test once every three years after 65 years of age.  If you are overweight and have a high risk for diabetes, consider being tested at a younger age or more often. PREVENTING INFECTION  Hepatitis B  If you have a higher risk for hepatitis B, you should be screened for this virus. You are considered at high risk for hepatitis B if:  You were born in a country where hepatitis B is common. Ask your health care provider which countries are considered high risk.  Your parents were born in a high-risk country, and you have not been immunized against hepatitis B (hepatitis B vaccine).  You have HIV or AIDS.  You use needles to inject street drugs.  You live with someone who has hepatitis B.  You have had sex with someone who has hepatitis B.  You get hemodialysis treatment.  You take certain medicines for conditions, including cancer, organ transplantation, and autoimmune conditions. Hepatitis C  Blood testing is recommended for:  Everyone born from [birth year] through 1965.  Anyone with known risk factors for hepatitis C. Sexually transmitted infections (STIs)  You should be screened for sexually transmitted infections (STIs) including gonorrhea and chlamydia if:  You are sexually active and are younger than 65 years of age.  You are older than 65 years of age and your health care provider tells you that you are at risk for this type of infection.  Your sexual activity has changed since you were last screened and you are at an increased risk for chlamydia or gonorrhea. Ask your health care provider if you are at risk.  If you do not have HIV, but are at risk, it may be recommended that you take a prescription medicine daily to prevent HIV  infection. This is called pre-exposure prophylaxis (PrEP). You are considered at risk if:  You are sexually active and do not regularly use condoms or know the HIV status of your partner(s).  You take drugs by injection.  You are sexually active with a partner who has HIV. Talk with your health care provider about whether you are at high risk of being infected with HIV. If  you choose to begin PrEP, you should first be tested for HIV. You should then be tested every 3 months for as long as you are taking PrEP.  PREGNANCY   If you are premenopausal and you may become pregnant, ask your health care provider about preconception counseling.  If you may become pregnant, take 400 to 800 micrograms (mcg) of folic acid every day.  If you want to prevent pregnancy, talk to your health care provider about birth control (contraception). OSTEOPOROSIS AND MENOPAUSE   Osteoporosis is a disease in which the bones lose minerals and strength with aging. This can result in serious bone fractures. Your risk for osteoporosis can be identified using a bone density scan.  If you are 18 years of age or older, or if you are at risk for osteoporosis and fractures, ask your health care provider if you should be screened.  Ask your health care provider whether you should take a calcium or vitamin D supplement to lower your risk for osteoporosis.  Menopause may have certain physical symptoms and risks.  Hormone replacement therapy may reduce some of these symptoms and risks. Talk to your health care provider about whether hormone replacement therapy is right for you.  HOME CARE INSTRUCTIONS   Schedule regular health, dental, and eye exams.  Stay current with your immunizations.   Do not use any tobacco products including cigarettes, chewing tobacco, or electronic cigarettes.  If you are pregnant, do not drink alcohol.  If you are breastfeeding, limit how much and how often you drink alcohol.  Limit  alcohol intake to no more than 1 drink per day for nonpregnant women. One drink equals 12 ounces of beer, 5 ounces of wine, or 1 ounces of hard liquor.  Do not use street drugs.  Do not share needles.  Ask your health care provider for help if you need support or information about quitting drugs.  Tell your health care provider if you often feel depressed.  Tell your health care provider if you have ever been abused or do not feel safe at home.   This information is not intended to replace advice given to you by your health care provider. Make sure you discuss any questions you have with your health care provider.   Document Released: 04/24/2011 Document Revised: 10/30/2014 Document Reviewed: 09/10/2013 Elsevier Interactive Patient Education [employer].

## 2016-03-28 NOTE — Progress Notes (Signed)
Pre visit review using our clinic review tool, if applicable. No additional management support is needed unless otherwise documented below in the visit note. 

## 2016-03-28 NOTE — Progress Notes (Signed)
   Subjective:    Patient ID: Jennifer Huang, female    DOB: [birth year], 65 y.o.   MRN: [id number]  HPI Here for new to medicare wellness visit, recent fall at home down stairs. Wearing shoes without grip and a pet tripped her. Did not hit her head, hit left side. Pain and some swelling. Icing the area and improving mildly. Happened yesterday. Please see A/P for status and treatment of chronic medical problems.   Diet: heart healthy Physical activity: sedentary Depression/mood screen: negative Hearing: intact to whispered voice Visual acuity: grossly normal, performs annual eye exam  ADLs: capable Fall risk: none Home safety: good Cognitive evaluation: intact to orientation, naming, recall and repetition EOL planning: adv directives discussed  I have personally reviewed and have noted 1. The patient's medical and social history - reviewed today no changes 2. Their use of alcohol, tobacco or illicit drugs 3. Their current medications and supplements 4. The patient's functional ability including ADL's, fall risks, home safety risks and hearing or visual impairment. 5. Diet and physical activities 6. Evidence for depression or mood disorders 7. Care team reviewed and updated (available in snapshot)  Review of Systems  Constitutional: Negative for fever, activity change, appetite change, fatigue and unexpected weight change.  HENT: Negative.   Eyes: Negative.   Respiratory: Negative.   Cardiovascular: Negative for chest pain, palpitations and leg swelling.  Gastrointestinal: Negative for nausea, abdominal pain, diarrhea, constipation and abdominal distention.  Musculoskeletal: Negative.   Skin: Negative.   Neurological: Negative.   Psychiatric/Behavioral: Negative.       Objective:   Physical Exam  Constitutional: She is oriented to person, place, and time. She appears well-developed and well-nourished.  HENT:  Head: Normocephalic and atraumatic.  Right Ear: External ear  normal.  Left Ear: External ear normal.  Eyes: EOM are normal.  Cardiovascular: Normal rate and regular rhythm.   Pulmonary/Chest: Effort normal and breath sounds normal. No respiratory distress. She has no wheezes.  Abdominal: Soft. Bowel sounds are normal. She exhibits no distension. There is no tenderness. There is no rebound.  Musculoskeletal: She exhibits no edema.  Neurological: She is alert and oriented to person, place, and time.  Skin: Skin is warm and dry.  Psychiatric: She has a normal mood and affect.   Filed Vitals:   03/28/16 1526  BP: 120/60  Pulse: 55  Temp: 98.1 F (36.7 C)  TempSrc: Oral  Height: 5\' 6"  (1.676 m)  Weight: 173 lb 8 oz (78.699 kg)  SpO2: 97%      Assessment & Plan:  Prevnar 13 given at visit.

## 2016-03-29 LAB — HEPATITIS C ANTIBODY: HCV AB: NEGATIVE

## 2016-03-30 ENCOUNTER — Other Ambulatory Visit: Payer: Self-pay | Admitting: *Deleted

## 2016-03-30 DIAGNOSIS — I10 Essential (primary) hypertension: Secondary | ICD-10-CM

## 2016-03-30 MED ORDER — AMLODIPINE BESYLATE 10 MG PO TABS: 10.0000 mg | ORAL_TABLET | Freq: Every day | ORAL | Status: DC

## 2016-04-01 ENCOUNTER — Encounter: Payer: Self-pay | Admitting: Internal Medicine

## 2016-04-01 NOTE — Assessment & Plan Note (Signed)
Checking lipid panel and adjust as needed. Not on meds now.

## 2016-04-01 NOTE — Assessment & Plan Note (Signed)
BP at goal on her amlodipine. Checking labs and adjust as needed.

## 2016-04-01 NOTE — Assessment & Plan Note (Signed)
Given prevnar 13 given and needs pneumo 23 next year. Wishes to defer shingles today to check on insurance coverage. Colonoscopy due in 2021 and mammogram due in 2018. Counseled today on advanced directives and their importance. Counseled also about regular exercise in helping to maintain health and weight. Given 10 year screening recommendations.

## 2016-04-28 ENCOUNTER — Ambulatory Visit (INDEPENDENT_AMBULATORY_CARE_PROVIDER_SITE_OTHER): Payer: Medicare Other | Admitting: Internal Medicine

## 2016-04-28 ENCOUNTER — Encounter: Payer: Self-pay | Admitting: Internal Medicine

## 2016-04-28 VITALS — BP 130/68 | HR 60 | Temp 101.1°F | Resp 16 | Ht 66.0 in | Wt 174.0 lb

## 2016-04-28 DIAGNOSIS — M25512 Pain in left shoulder: Secondary | ICD-10-CM

## 2016-04-28 MED ORDER — LIDOCAINE 5 % EX PTCH: 1.0000 | MEDICATED_PATCH | CUTANEOUS | Status: AC

## 2016-04-28 NOTE — Patient Instructions (Signed)
We have sent in a lidoderm patch for you to use on the shoulder for pain. This is likely a muscle pain and should get better over the next several weeks.   You can use heat on the area to help.

## 2016-04-28 NOTE — Progress Notes (Signed)
Pre visit review using our clinic review tool, if applicable. No additional management support is needed unless otherwise documented below in the visit note. 

## 2016-04-28 NOTE — Assessment & Plan Note (Signed)
This is muscular pain and no signs of tendon damage or broken bones on exam. No bruising of the skin. Rx for lidoderm patches for pain. Cannot take steroids due to her glaucoma.

## 2016-04-28 NOTE — Progress Notes (Signed)
   Subjective:    Patient ID: Jennifer Huang, female    DOB: November 23, 1950, 65 y.o.   MRN: [id number]  HPI The patient is a 65 YO female coming in for left shoulder pain. Started after fall about 1 month ago. She is able to have full ROM. No weakness or numbness in the arm. She has been trying the naproxen which helps some. Overall it is gradually improving. She tried to see her orthopedic but they have left the state.   Review of Systems  Constitutional: Negative for fever, activity change, appetite change, fatigue and unexpected weight change.  Respiratory: Negative.   Cardiovascular: Negative for chest pain, palpitations and leg swelling.  Gastrointestinal: Negative for nausea, abdominal pain, diarrhea, constipation and abdominal distention.  Musculoskeletal: Positive for myalgias and arthralgias. Negative for gait problem.  Skin: Negative.   Neurological: Negative.       Objective:   Physical Exam  Constitutional: She is oriented to person, place, and time. She appears well-developed and well-nourished.  HENT:  Head: Normocephalic and atraumatic.  Right Ear: External ear normal.  Left Ear: External ear normal.  Eyes: EOM are normal.  Cardiovascular: Normal rate and regular rhythm.   Pulmonary/Chest: Effort normal and breath sounds normal. No respiratory distress. She has no wheezes.  Abdominal: Soft. She exhibits no distension. There is no tenderness. There is no rebound.  Musculoskeletal: She exhibits tenderness. She exhibits no edema.  Pain in the muscles of the shoulder and trapezius. No tendon injury or bone injury.   Neurological: She is alert and oriented to person, place, and time.  Skin: Skin is warm and dry.   Filed Vitals:   04/28/16 1628  BP: 130/68  Pulse: 60  Temp: 101.1 F (38.4 C)  TempSrc: Oral  Resp: 16  Height: 5\' 6"  (1.676 m)  Weight: 174 lb (78.926 kg)  SpO2: 98%      Assessment & Plan:

## 2016-05-09 ENCOUNTER — Telehealth: Payer: Self-pay | Admitting: Emergency Medicine

## 2016-05-09 NOTE — Telephone Encounter (Signed)
Pt called and is having trouble getting her prescription filled. She stated that her insurance needs approval. She left a number to call for [employer] since they need approval [phone]. Please follow up thanks.

## 2016-05-11 NOTE — Telephone Encounter (Signed)
APPROVED through 10/22/2016, pt advised of same

## 2016-05-11 NOTE — Telephone Encounter (Signed)
PA initiated via CoverMyMeds key [id number]

## 2016-08-31 ENCOUNTER — Encounter: Payer: Self-pay | Admitting: Internal Medicine

## 2016-08-31 ENCOUNTER — Ambulatory Visit (INDEPENDENT_AMBULATORY_CARE_PROVIDER_SITE_OTHER): Payer: Medicare Other | Admitting: Internal Medicine

## 2016-08-31 DIAGNOSIS — M25512 Pain in left shoulder: Secondary | ICD-10-CM | POA: Diagnosis not present

## 2016-08-31 DIAGNOSIS — Z23 Encounter for immunization: Secondary | ICD-10-CM

## 2016-08-31 NOTE — Progress Notes (Signed)
   Subjective:    Patient ID: Jennifer Huang, female    DOB: 11-02-1950, 65 y.o.   MRN: [id number]  HPI The patient is a 65 YO female coming in for left shoulder pain. This has been going on for some time and several months ago was getting better. In the last month she has injured it again inadvertently. She was moving something and now it is hurting worse and not able to move as well. She has been taking tylenol for pain which is doing okay. She does also have the lidoderm patches which are doing okay as well. She has also tried some things over the counter which are partially effective as well.   Review of Systems  Constitutional: Positive for activity change. Negative for appetite change, fatigue, fever and unexpected weight change.  Respiratory: Negative.   Cardiovascular: Negative.   Gastrointestinal: Negative.   Musculoskeletal: Positive for arthralgias and myalgias. Negative for back pain, gait problem, joint swelling, neck pain and neck stiffness.  Skin: Negative.   Neurological: Negative.       Objective:   Physical Exam  Constitutional: She is oriented to person, place, and time. She appears well-developed and well-nourished.  HENT:  Head: Normocephalic and atraumatic.  Cardiovascular: Normal rate and regular rhythm.   Pulmonary/Chest: Effort normal. No respiratory distress. She has no wheezes.  Abdominal: Soft. She exhibits no distension. There is no tenderness. There is no rebound.  Musculoskeletal: She exhibits tenderness. She exhibits no edema.  Pain in the left shoulder and ROM moderately limitied by pain  Neurological: She is alert and oriented to person, place, and time.  Skin: Skin is warm and dry.   Vitals:   08/31/16 1617  BP: (!) 142/88  Pulse: 68  Resp: 16  Temp: 98.4 F (36.9 C)  SpO2: 96%  Weight: 172 lb (78 kg)  Height: 5\' 5"  (1.651 m)      Assessment & Plan:  Flu shot given at visit.

## 2016-08-31 NOTE — Progress Notes (Signed)
Pre visit review using our clinic review tool, if applicable. No additional management support is needed unless otherwise documented below in the visit note. 

## 2016-08-31 NOTE — Patient Instructions (Signed)
We have given you the flu shot today.   Schedule with Dr. Tamala Julian to have him treat the shoulder.

## 2016-09-02 NOTE — Assessment & Plan Note (Signed)
Refer to sports medicine for evaluation and treatment. She has reinjured a healing shoulder.

## 2016-09-10 NOTE — Progress Notes (Signed)
[facility] [address] [place], [place] [address] Phone: [phone] Subjective:    I'm seeing this patient by the request  of:  Hoyt Koch, MD   CC: Left shoulder pain  [id number]  Jennifer Huang is a 65 y.o. female coming in with complaint of left shoulder pain. Several months of pain. Patient states that seemed to have days when there was good days and bad days. Went to move an object that was too heavy. Has had difficulty even using it. Tylenol does help a little bit. Patient is also tried lidocaine patches with mild improvement. Patient denies any radiation down the arm or any numbness or tingling. She has started numbness some decreased range of motion. Patient is a severity of pain a 7 out of 10. Patient denies any significant radiation down the arm but is having pain that is waking her up at night.    Past Medical History:  Diagnosis Date  . Allergy    year around allergies  . Colon polyps    anal polyp  . Gout, unspecified   . Hyperlipidemia   . Primary localized osteoarthrosis, lower leg   . Unspecified essential hypertension   . Unspecified glaucoma(365.9)    Left Eye   Past Surgical History:  Procedure Laterality Date  . COLON SURGERY     anal polyp excised gen'l anesthesia  . COLONOSCOPY  10-14-2010   jacobs-hx TA 2006  . ENUCLEATION     OD 2nd to Glaucoma  . OOPHORECTOMY     Had Blockage  . SALPINGECTOMY     Had Blockage  . TOTAL ABDOMINAL HYSTERECTOMY  2002   Social History   Social History  . Marital status: Married    Spouse name: N/A  . Number of children: 0  . Years of education: N/A   Occupational History  . Homemaker Retired  . SO - self-employed    Social History Main Topics  . Smoking status: Never Smoker  . Smokeless tobacco: Never Used  . Alcohol use No  . Drug use: No  . Sexual activity: Yes    Partners: Male   Other Topics Concern  . None   Social History Narrative   HSG, @  year college. Married '73. No children. On disability - orthopedic: knee, back, shortened leg.. SO - self-employed.      Dec '12 - many stressors: husband with cancer, brother with pancreatic cancer, housing and financial issues.             Allergies  Allergen Reactions  . Penicillins     REACTION: rash/hives  . Sulfonamide Derivatives     REACTION: rash/hives   Family History  Problem Relation Age of Onset  . Osteoarthritis Mother   . Hypertension Mother   . Emphysema Father   . Diabetes Father   . Hypertension Father   . Arthritis Sister     hip replacments, wrist surgery  . Hyperlipidemia Sister   . Hypertension Sister   . Diabetes Brother   . Hypertension Brother   . Hyperlipidemia Brother   . Cancer - Other Brother   . Heart disease Brother   . Hypertension Brother   . Hyperlipidemia Brother   . Diabetes Brother   . Colon polyps Brother   . Diabetes Brother   . Heart disease Brother   . Hypertension Brother   . Hyperlipidemia Brother   . Colon polyps Brother   . Esophageal cancer Neg Hx   .  Rectal cancer Neg Hx   . Stomach cancer Neg Hx     Past medical history, social, surgical and family history all reviewed in electronic medical record.  No pertanent information unless stated regarding to the chief complaint.   Review of Systems:Review of systems updated and as accurate as of 09/11/16  No headache, visual changes, nausea, vomiting, diarrhea, constipation, dizziness, abdominal pain, skin rash, fevers, chills, night sweats, weight loss, swollen lymph nodes, chest pain, shortness of breath, mood changes.   Objective  Blood pressure 130/70, pulse 64, height 5\' 5"  (1.651 m), weight 173 lb (78.5 kg), SpO2 97 %. Systems examined below as of 09/11/16   General: No apparent distress alert and oriented x3 mood and affect normal, dressed appropriately.  HEENT: Pupils equal, extraocular movements intact  Respiratory: Patient's speak in full sentences and does not  appear short of breath  Cardiovascular: No lower extremity edema, non tender, no erythema  Skin: Warm dry intact with no signs of infection or rash on extremities or on axial skeleton.  Abdomen: Soft nontender  Neuro: Cranial nerves II through XII are intact, neurovascularly intact in all extremities with 2+ DTRs and 2+ pulses.  Lymph: No lymphadenopathy of posterior or anterior cervical chain or axillae bilaterally.  Gait normal with good balance and coordination.  MSK:  Non tender with full range of motion and good stability and symmetric strength and tone of , elbows, wrist, hip, knee and ankles bilaterally.  Shoulder: left Inspection reveals no abnormalities, atrophy or asymmetry. Palpation is normal with no tenderness over AC joint or bicipital groove. ROM is full in all planes passively. Rotator cuff strength normal throughout. signs of impingement with positive Neer and Hawkin's tests, but negative empty can sign. Speeds and Yergason's tests normal. No labral pathology noted with negative Obrien's, negative clunk and good stability. Normal scapular function observed. No painful arc and no drop arm sign. No apprehension sign  MSK US performed of: left This study was ordered, performed, and interpreted by Charlann Boxer D.O.  Shoulder:   Supraspinatus:  Appears normal on long and transverse views, Bursal bulge seen with shoulder abduction on impingement view. Infraspinatus:  Appears normal on long and transverse views. Significant increase in Doppler flow Subscapularis:  Appears normal on long and transverse views. Positive bursa Teres Minor:  Appears normal on long and transverse views. AC joint:  Mild arthritis Glenohumeral Joint:  Appears normal without effusion. Glenoid Labrum:  Intact without visualized tears. Biceps Tendon: Significant hypoechoic changes within the tendon sheath  Impression: Subacromial bursitis  Procedure note [id number]; 15 minutes spent for Therapeutic  exercises as stated in above notes.  This included exercises focusing on stretching, strengthening, with significant focus on eccentric aspects. Shoulder Exercises that included:  Basic scapular stabilization to include adduction and depression of scapula Scaption, focusing on proper movement and good control Internal and External rotation utilizing a theraband, with elbow tucked at side entire time Rows with theraband    Proper technique shown and discussed handout in great detail with ATC.  All questions were discussed and answered.      Impression and Recommendations:     This case required medical decision making of moderate complexity.      Note: This dictation was prepared with Dragon dictation along with smaller phrase technology. Any transcriptional errors that result from this process are unintentional.

## 2016-09-11 ENCOUNTER — Ambulatory Visit: Payer: Self-pay

## 2016-09-11 ENCOUNTER — Ambulatory Visit (INDEPENDENT_AMBULATORY_CARE_PROVIDER_SITE_OTHER): Payer: Medicare Other | Admitting: Family Medicine

## 2016-09-11 ENCOUNTER — Encounter: Payer: Self-pay | Admitting: Family Medicine

## 2016-09-11 VITALS — BP 130/70 | HR 64 | Ht 65.0 in | Wt 173.0 lb

## 2016-09-11 DIAGNOSIS — M25512 Pain in left shoulder: Secondary | ICD-10-CM

## 2016-09-11 DIAGNOSIS — M7552 Bursitis of left shoulder: Secondary | ICD-10-CM

## 2016-09-11 MED ORDER — MELOXICAM 15 MG PO TABS: 15.0000 mg | ORAL_TABLET | Freq: Every day | ORAL | 0 refills | Status: DC

## 2016-09-11 NOTE — Assessment & Plan Note (Signed)
Patient does have more of a shoulder bursitis. Seems to be quite severe. Waking her up at night. Given meloxicam daily for 10 days discussed icing regimen. Work with Product/process development scientist to learn home exercises. Discussed which activities doing which ones to avoid. Patient will continue to be active. Follow-up again in 3 weeks. Worsening symptoms consider injection, formal physical therapy and x-rays.

## 2016-09-11 NOTE — Patient Instructions (Signed)
Good to see you  Ice 20 minutes 2 times daily. Usually after activity and before bed. Exercises 3 times a week.  pennsaid pinkie amount topically 2 times daily as needed.  Meloxicam daily for 10 days.  Vitamin D 4000 IU daily for 2 weeks then 2000IU daily thereafter  Avoid any activity with your hands outside your peripheral vision  See me again in 3-4 weeks. If not better we will do injection

## 2016-10-03 ENCOUNTER — Encounter: Payer: Self-pay | Admitting: Family Medicine

## 2016-10-03 ENCOUNTER — Ambulatory Visit: Payer: Self-pay

## 2016-10-03 ENCOUNTER — Ambulatory Visit (INDEPENDENT_AMBULATORY_CARE_PROVIDER_SITE_OTHER)
Admission: RE | Admit: 2016-10-03 | Discharge: 2016-10-03 | Disposition: A | Discharge status: Home / Self Care | Payer: Medicare Other | Source: Ambulatory Visit | Attending: Family Medicine | Admitting: Family Medicine

## 2016-10-03 ENCOUNTER — Ambulatory Visit (INDEPENDENT_AMBULATORY_CARE_PROVIDER_SITE_OTHER): Payer: Medicare Other | Admitting: Family Medicine

## 2016-10-03 ENCOUNTER — Telehealth: Payer: Self-pay | Admitting: Internal Medicine

## 2016-10-03 VITALS — BP 136/74 | HR 62 | Ht 65.0 in | Wt 174.0 lb

## 2016-10-03 DIAGNOSIS — M7552 Bursitis of left shoulder: Secondary | ICD-10-CM

## 2016-10-03 DIAGNOSIS — H409 Unspecified glaucoma: Secondary | ICD-10-CM

## 2016-10-03 NOTE — Patient Instructions (Signed)
Good to see you.  Stay active PT will be calling you  You should do well with the 1/2 injection we did  Ice 20 minutes 2 times daily. Usually after activity and before bed. See me again in 4-6 weeks Happy holidays!

## 2016-10-03 NOTE — Telephone Encounter (Signed)
Patient is requesting referral to [facility] Margy Clarks MD) for glaucoma.

## 2016-10-03 NOTE — Assessment & Plan Note (Signed)
Patient given injection today and tolerated the procedure well. We discussed icing regimen, patient was referred to formal physical therapy. Patient has no weakness. We will get x-rays secondary to this being a fall make sure we are not missing: Fracture. Follow-up again with me in 4-6 weeks.

## 2016-10-03 NOTE — Progress Notes (Signed)
[name] [place], [place] [address] Phone: [phone] Subjective:    CC: Left shoulder pain f/u  [id number]  Jennifer Huang is a 65 y.o. female coming in with complaint of left shoulder pain. Found to have more of a subacromial bursitis. Attempted conservative therapy. Did not make any significant improvement. Has had difficulty doing the home exercises on a regular basis. Worsening symptoms. Waking her up at night. Denies any weakness. States that the pain seems to be unfortunately unrelenting.    Past Medical History:  Diagnosis Date  . Allergy    year around allergies  . Colon polyps    anal polyp  . Gout, unspecified   . Hyperlipidemia   . Primary localized osteoarthrosis, lower leg   . Unspecified essential hypertension   . Unspecified glaucoma(365.9)    Left Eye   Past Surgical History:  Procedure Laterality Date  . COLON SURGERY     anal polyp excised gen'l anesthesia  . COLONOSCOPY  10-14-2010   jacobs-hx TA 2006  . ENUCLEATION     OD 2nd to Glaucoma  . OOPHORECTOMY     Had Blockage  . SALPINGECTOMY     Had Blockage  . TOTAL ABDOMINAL HYSTERECTOMY  2002   Social History   Social History  . Marital status: Married    Spouse name: N/A  . Number of children: 0  . Years of education: N/A   Occupational History  . Homemaker Retired  . SO - self-employed    Social History Main Topics  . Smoking status: Never Smoker  . Smokeless tobacco: Never Used  . Alcohol use No  . Drug use: No  . Sexual activity: Yes    Partners: Male   Other Topics Concern  . None   Social History Narrative   HSG, @ year college. Married '73. No children. On disability - orthopedic: knee, back, shortened leg.. SO - self-employed.      Dec '12 - many stressors: husband with cancer, brother with pancreatic cancer, housing and financial issues.             Allergies  Allergen Reactions  . Penicillins     REACTION:  rash/hives  . Sulfonamide Derivatives     REACTION: rash/hives   Family History  Problem Relation Age of Onset  . Osteoarthritis Mother   . Hypertension Mother   . Emphysema Father   . Diabetes Father   . Hypertension Father   . Arthritis Sister     hip replacments, wrist surgery  . Hyperlipidemia Sister   . Hypertension Sister   . Diabetes Brother   . Hypertension Brother   . Hyperlipidemia Brother   . Cancer - Other Brother   . Heart disease Brother   . Hypertension Brother   . Hyperlipidemia Brother   . Diabetes Brother   . Colon polyps Brother   . Diabetes Brother   . Heart disease Brother   . Hypertension Brother   . Hyperlipidemia Brother   . Colon polyps Brother   . Esophageal cancer Neg Hx   . Rectal cancer Neg Hx   . Stomach cancer Neg Hx     Past medical history, social, surgical and family history all reviewed in electronic medical record.  No pertanent information unless stated regarding to the chief complaint.   Review of Systems:Review of systems updated and as accurate as of 10/03/16  No headache, visual changes, nausea, vomiting, diarrhea, constipation, dizziness, abdominal pain,  skin rash, fevers, chills, night sweats, weight loss, swollen lymph nodes, chest pain, shortness of breath, mood changes.   Objective  Blood pressure 136/74, pulse 62, height 5\' 5"  (1.651 m), weight 174 lb (78.9 kg), SpO2 97 %.  Systems examined below as of 10/03/16 General: NAD A&O x3 mood, affect normal  HEENT: Pupils left dialated, extraocular movements intact no nystagmus Respiratory: not short of breath at rest or with speaking Cardiovascular: No lower extremity edema, non tender Skin: Warm dry intact with no signs of infection or rash on extremities or on axial skeleton. Abdomen: Soft nontender, no masses Neuro: Cranial nerves  intact, neurovascularly intact in all extremities with 2+ DTRs and 2+ pulses. Lymph: No lymphadenopathy appreciated today  Gait normal with good  balance and coordination.  MSK: Non tender with full range of motion and good stability and symmetric strength and tone of shoulders, elbows, wrist,  knee hips and ankles bilaterally.   Shoulder: left Inspection reveals no abnormalities, atrophy or asymmetry. Palpation is normal with no tenderness over AC joint or bicipital groove. ROM is full in all planes passively. Rotator cuff strength normal throughout. signs of impingement with positive Neer and Hawkin's tests, but negative empty can sign.Worsening from previous exam Speeds and Yergason's tests normal. Pain with labral pathology as well. Normal scapular function observed. No painful arc and no drop arm sign. No apprehension sign  Procedure: Real-time Ultrasound Guided Injection of left glenohumeral joint Device: GE Logiq E  Ultrasound guided injection is preferred based studies that show increased duration, increased effect, greater accuracy, decreased procedural pain, increased response rate with ultrasound guided versus blind injection.  Verbal informed consent obtained.  Time-out conducted.  Noted no overlying erythema, induration, or other signs of local infection.  Skin prepped in a sterile fashion.  Local anesthesia: Topical Ethyl chloride.  With sterile technique and under real time ultrasound guidance:  Joint visualized.  23g 1  inch needle inserted posterior approach. Pictures taken for needle placement. Patient did have injection of 2 cc of 1% lidocaine, 2 cc of 0.5% Marcaine, and 0.5cc of Kenalog 40 mg/dL. Completed without difficulty  Pain immediately resolved suggesting accurate placement of the medication.  Advised to call if fevers/chills, erythema, induration, drainage, or persistent bleeding.  Images permanently stored and available for review in the ultrasound unit.  Impression: Technically successful ultrasound guided injection.        Impression and Recommendations:     This case required medical  decision making of moderate complexity.      Note: This dictation was prepared with Dragon dictation along with smaller phrase technology. Any transcriptional errors that result from this process are unintentional.

## 2016-10-03 NOTE — Telephone Encounter (Signed)
Sent in referral

## 2016-10-10 LAB — HM MAMMOGRAPHY

## 2016-10-12 NOTE — Telephone Encounter (Signed)
Left message to call to schedule PT eval 10/04/16 & 10/10/16 no return call

## 2016-10-17 ENCOUNTER — Encounter: Payer: Self-pay | Admitting: Internal Medicine

## 2016-10-17 NOTE — Progress Notes (Signed)
Results entered and sent to scan  

## 2016-10-24 ENCOUNTER — Encounter: Payer: Self-pay | Admitting: Physical Therapy

## 2016-10-24 ENCOUNTER — Ambulatory Visit: Payer: Medicare Other | Attending: Family Medicine | Admitting: Physical Therapy

## 2016-10-24 DIAGNOSIS — M25512 Pain in left shoulder: Secondary | ICD-10-CM | POA: Insufficient documentation

## 2016-10-24 DIAGNOSIS — M62838 Other muscle spasm: Secondary | ICD-10-CM | POA: Diagnosis not present

## 2016-10-24 DIAGNOSIS — M25612 Stiffness of left shoulder, not elsewhere classified: Secondary | ICD-10-CM

## 2016-10-24 DIAGNOSIS — G8929 Other chronic pain: Secondary | ICD-10-CM | POA: Diagnosis not present

## 2016-10-24 NOTE — Therapy (Signed)
[facility] [address] [place], [place], [address] Phone: [phone]   Fax:  [phone]  Physical Therapy Evaluation  Patient Details  Name: Jennifer Huang MRN: [id number] Date of Birth: 02/10/1951 Referring Provider: Dr. Tamala Julian  Encounter Date: 10/24/2016      PT End of Session - 10/24/16 1446    Visit Number 1   Date for PT Re-Evaluation 11/21/16   PT Start Time 0205   PT Stop Time 0255   PT Time Calculation (min) 50 min   Activity Tolerance Patient tolerated treatment well   Behavior During Therapy [facility] for tasks assessed/performed      Past Medical History:  Diagnosis Date  . Allergy    year around allergies  . Colon polyps    anal polyp  . Gout, unspecified   . Hyperlipidemia   . Primary localized osteoarthrosis, lower leg   . Unspecified essential hypertension   . Unspecified glaucoma(365.9)    Left Eye    Past Surgical History:  Procedure Laterality Date  . COLON SURGERY     anal polyp excised gen'l anesthesia  . COLONOSCOPY  10-14-2010   jacobs-hx TA 2006  . ENUCLEATION     OD 2nd to Glaucoma  . OOPHORECTOMY     Had Blockage  . SALPINGECTOMY     Had Blockage  . TOTAL ABDOMINAL HYSTERECTOMY  2002    There were no vitals filed for this visit.       Subjective Assessment - 10/24/16 1407    Subjective Pt is experiencing left shoulder pain that stemmed from a fall in late October. She states that she fell down 4 steps and was holding onto the rail with the right hand and the left shoulder hit the stairs. Pt has fallen two more times since then, the exact same way. Pt has been given medication to help with the shoulder pain. She has also used ice and cream. Pt was given some exercises by the doctor however she has not done them consistently. She received an injection in the left arm at the first of December and she reports this has given her some relief.  Pt also reprts that she has a bone  spur on the back sisde of the right shoulder.   Limitations House hold activities   Diagnostic tests Xray- negative; Ultrasound- muscle bruising   Patient Stated Goals Get back to using the shoulder normal, before the fall   Currently in Pain? Yes   Pain Score 2    Pain Location Shoulder   Pain Orientation Left   Pain Descriptors / Indicators Sore;Nagging   Pain Type Chronic pain   Pain Onset More than a month ago   Pain Frequency Intermittent   Aggravating Factors  scaption, flexion, IR (washing low back)   Pain Relieving Factors Ice, Injection, Cream   Effect of Pain on Daily Activities Has to get dressed by putting left arm in first            [facility] PT Assessment - 10/24/16 0001      Assessment   Medical Diagnosis Left shoulder pain (muscle bruising)   Referring Provider Dr. Lyndel Pleasure Dominance Left   Next MD Visit January 15   Prior Therapy yes     Balance Screen   Has the patient fallen in the past 6 months Yes   How many times? 3   Has the patient had a decrease in activity level because  of a fear of falling?  No   Is the patient reluctant to leave their home because of a fear of falling?  No     Home Environment   Living Environment Private residence   Living Arrangements Alone   Type of [place] to enter   Entrance Stairs-Number of Steps 6   Entrance Stairs-Rails Right     Prior Function   Level of Independence Independent   Vocation Retired   Nurse, children's, Education administrator, Haematologist, home repairs      Posture/Postural Control   Posture/Postural Control Postural limitations   Postural Limitations Forward head;Rounded Shoulders     ROM / Strength   AROM / PROM / Strength AROM;Strength;PROM     AROM   AROM Assessment Site Shoulder   Right/Left Shoulder Right;Left   Right Shoulder Flexion 155 Degrees   Right Shoulder ABduction 155 Degrees   Left Shoulder Flexion 110 Degrees   Left Shoulder ABduction 100 Degrees   Left Shoulder  Internal Rotation 35 Degrees  P!   Left Shoulder External Rotation 75 Degrees  "felt tight"     PROM   PROM Assessment Site Shoulder   Right/Left Shoulder Right;Left   Left Shoulder Flexion 155 Degrees   Left Shoulder ABduction 120 Degrees   Left Shoulder Internal Rotation --  not tested   Left Shoulder External Rotation 85 Degrees     Strength   Overall Strength Comments 5/5 with right shoulder flexion, abduction, ER, IR. 4/5 left shoulder flexion, abduction, IR, and ER.   Strength Assessment Site Shoulder;Elbow   Right/Left Elbow Right;Left   Right Elbow Flexion 5/5   Right Elbow Extension 5/5   Left Elbow Flexion 5/5   Left Elbow Extension 5/5     Palpation   Palpation comment ttp supraspinatus muscle, Tight cervical musculature     Special Tests    Special Tests Rotator Cuff Impingement   Rotator Cuff Impingment tests Michel Bickers test     Hawkins-Kennedy test   Findings Positive   Side Left                   [facility] Adult PT Treatment/Exercise - 10/24/16 0001      Modalities   Modalities Moist Heat;Electrical Stimulation     Moist Heat Therapy   Number Minutes Moist Heat 15 Minutes   Moist Heat Location Shoulder     Electrical Stimulation   Electrical Stimulation Location left shoulder/scapular region   Electrical Stimulation Action IFC   Electrical Stimulation Parameters supine   Electrical Stimulation Goals Pain                PT Education - 10/24/16 1445    Education provided Yes   Education Details postural correction, scapular stabilization   Person(s) Educated Patient   Methods Explanation;Handout   Comprehension Verbalized understanding          PT Short Term Goals - 10/24/16 1454      PT SHORT TERM GOAL #1   Title Pt will demonstrate proper recall of HEP   Time 1   Period Weeks   Status New           PT Long Term Goals - 10/24/16 1454      PT LONG TERM GOAL #1   Title Pt will increase AROM to equal the  right shoulder in order to achieve her normal range.   Time 4   Period Weeks   Status New  PT LONG TERM GOAL #2   Title Pt will be able to perform all household activities such as reaching into cabinets and overhead without limitation or pain.   Time 4   Period Weeks   Status New     PT LONG TERM GOAL #3   Title Pt will be increase left shoulder strength to 4+/5 with no increase in symtpoms.   Time 4   Period Weeks   Status New               Plan - 10/24/16 1447    Clinical Impression Statement Pt presents with left shoulder pain, decreased AROM and decreased strength. Pt is unable to accomplish some functional tasks with the left arm due to the pain. She reports the most increase in symptoms with left IR at 90 degrees abduction. Pt also tests positive for [employer] test which could indicate some degree of shoulder impingement. Elbow strength was [facility]. Pt also presents with rounded shoulders and forward head. Upon palpation, she ius TTP over the suraspinatus and has very tight cervical musculature.   Rehab Potential Good   PT Frequency 2x / week   PT Duration 4 weeks   PT Treatment/Interventions ADLs/Self Care Home Management;Cryotherapy;Electrical Stimulation;Iontophoresis 4mg /ml Dexamethasone;Functional mobility training;Ultrasound;Moist Heat;Therapeutic activities;Therapeutic exercise;Patient/family education;Passive range of motion;Manual techniques   PT Next Visit Plan Shoulder AROM, therex, manual, pain management   PT Home Exercise Plan Scap stabilization   Consulted and Agree with Plan of Care Patient      Patient will benefit from skilled therapeutic intervention in order to improve the following deficits and impairments:  Decreased activity tolerance, Decreased mobility, Decreased range of motion, Decreased strength, Hypomobility, Increased muscle spasms, Impaired flexibility, Postural dysfunction, Improper body mechanics, Impaired UE functional use,  Pain  Visit Diagnosis: Chronic left shoulder pain  Other muscle spasm  Stiffness of left shoulder, not elsewhere classified     Problem List Patient Active Problem List   Diagnosis Date Noted  . Acute shoulder bursitis, left 09/11/2016  . Left shoulder pain 04/28/2016  . Goiter 03/17/2014  . Routine health maintenance 10/19/2012  . Elevated LDL cholesterol level 10/17/2012  . Abnormal breast finding 10/03/2011  . Gout, unspecified 09/22/2008  . GLAUCOMA, LEFT EYE 09/22/2008  . Allergic rhinitis 09/22/2008  . Essential hypertension 07/12/2007  . OSTEOARTHROSIS, LOCAL, PRIMARY, LOWER LEG 07/12/2007    Toy Baker, SPT 10/24/2016, 3:03 PM  [place] [address] [place], [place], [address] Phone: [phone]   Fax:  [phone]  Name: LABRESHA BOQUIST MRN: [id number] Date of Birth: [birth year]

## 2016-10-25 ENCOUNTER — Ambulatory Visit: Payer: Medicare Other | Admitting: Physical Therapy

## 2016-10-25 ENCOUNTER — Encounter: Payer: Self-pay | Admitting: Physical Therapy

## 2016-10-25 DIAGNOSIS — M25612 Stiffness of left shoulder, not elsewhere classified: Secondary | ICD-10-CM | POA: Diagnosis not present

## 2016-10-25 DIAGNOSIS — M25512 Pain in left shoulder: Principal | ICD-10-CM

## 2016-10-25 DIAGNOSIS — G8929 Other chronic pain: Secondary | ICD-10-CM

## 2016-10-25 DIAGNOSIS — M62838 Other muscle spasm: Secondary | ICD-10-CM | POA: Diagnosis not present

## 2016-10-25 NOTE — Therapy (Signed)
[facility], [place], [address] Phone: [phone]   Fax:  [phone]  Physical Therapy Treatment  Patient Details  Name: Jennifer Huang MRN: [id number] Date of Birth: 1951/03/02 Referring Provider: Dr. Tamala Julian  Encounter Date: 10/25/2016      PT End of Session - 10/25/16 1056    Visit Number 2   Date for PT Re-Evaluation 11/21/16   PT Start Time [id number]   PT Stop Time 1109   PT Time Calculation (min) 54 min   Activity Tolerance Patient tolerated treatment well   Behavior During Therapy [facility] for tasks assessed/performed      Past Medical History:  Diagnosis Date  . Allergy    year around allergies  . Colon polyps    anal polyp  . Gout, unspecified   . Hyperlipidemia   . Primary localized osteoarthrosis, lower leg   . Unspecified essential hypertension   . Unspecified glaucoma(365.9)    Left Eye    Past Surgical History:  Procedure Laterality Date  . COLON SURGERY     anal polyp excised gen'l anesthesia  . COLONOSCOPY  10-14-2010   jacobs-hx TA 2006  . ENUCLEATION     OD 2nd to Glaucoma  . OOPHORECTOMY     Had Blockage  . SALPINGECTOMY     Had Blockage  . TOTAL ABDOMINAL HYSTERECTOMY  2002    There were no vitals filed for this visit.      Subjective Assessment - 10/25/16 1018    Subjective Pt reports no changes since evaluation   Currently in Pain? No/denies   Pain Score 0-No pain                         [facility] Adult PT Treatment/Exercise - 10/25/16 0001      Exercises   Exercises Shoulder     Shoulder Exercises: Seated   Other Seated Exercises Rows and lats 20lb 2x10      Shoulder Exercises: Standing   Flexion Both;10 reps;Weights  x2   Shoulder Flexion Weight (lbs) 1   ABduction Both;10 reps;Weights  x2   Shoulder ABduction Weight (lbs) 1   Extension 10 reps;Theraband;Both  x2   Theraband Level (Shoulder Extension) Level 2 (Red)   Row  Both;10 reps;Theraband  x2   Theraband Level (Shoulder Row) Level 2 (Red)     Shoulder Exercises: ROM/Strengthening   UBE (Upper Arm Bike) NuStep L3 x 6 min      Modalities   Modalities Moist Heat;Electrical Stimulation     Moist Heat Therapy   Number Minutes Moist Heat 15 Minutes   Moist Heat Location Shoulder     Electrical Stimulation   Electrical Stimulation Location left shoulder/scapular region   Electrical Stimulation Action IFC   Electrical Stimulation Parameters supine   Electrical Stimulation Goals Pain                PT Education - 10/24/16 1445    Education provided Yes   Education Details postural correction, scapular stabilization   Person(s) Educated Patient   Methods Explanation;Handout   Comprehension Verbalized understanding          PT Short Term Goals - 10/24/16 1454      PT SHORT TERM GOAL #1   Title Pt will demonstrate proper recall of HEP   Time 1   Period Weeks   Status New  PT Long Term Goals - 10-31-2016 1454      PT LONG TERM GOAL #1   Title Pt will increase AROM to equal the right shoulder in order to achieve her normal range.   Time 4   Period Weeks   Status New     PT LONG TERM GOAL #2   Title Pt will be able to perform all household activities such as reaching into cabinets and overhead without limitation or pain.   Time 4   Period Weeks   Status New     PT LONG TERM GOAL #3   Title Pt will be increase left shoulder strength to 4+/5 with no increase in symtpoms.   Time 4   Period Weeks   Status New               Plan - 10/25/16 1057    Clinical Impression Statement Pt tolerated an initial progression to exercise well. No reports of increase pain, just a pulling sensation with shoulder flexion and abduction. No shoulder elevation noted with active movements.    Rehab Potential Good   PT Frequency 2x / week   PT Duration 4 weeks   PT Treatment/Interventions ADLs/Self Care Home  Management;Cryotherapy;Electrical Stimulation;Iontophoresis 4mg /ml Dexamethasone;Functional mobility training;Ultrasound;Moist Heat;Therapeutic activities;Therapeutic exercise;Patient/family education;Passive range of motion;Manual techniques;Stair training   PT Next Visit Plan Shoulder AROM, therex, manual, pain management      Patient will benefit from skilled therapeutic intervention in order to improve the following deficits and impairments:  Decreased activity tolerance, Decreased mobility, Decreased range of motion, Decreased strength, Hypomobility, Increased muscle spasms, Impaired flexibility, Postural dysfunction, Improper body mechanics, Impaired UE functional use, Pain  Visit Diagnosis: Chronic left shoulder pain  Other muscle spasm  Stiffness of left shoulder, not elsewhere classified       G-Codes - 31-Oct-2016 1510    Functional Assessment Tool Used foto 62% limitation   Functional Limitation Other PT primary   Other PT Primary Current Status [id number]) At least 60 percent but less than 80 percent impaired, limited or restricted   Other PT Primary Goal Status [id number]) At least 40 percent but less than 60 percent impaired, limited or restricted      Problem List Patient Active Problem List   Diagnosis Date Noted  . Acute shoulder bursitis, left 09/11/2016  . Left shoulder pain 04/28/2016  . Goiter 03/17/2014  . Routine health maintenance 10/19/2012  . Elevated LDL cholesterol level 10/17/2012  . Abnormal breast finding 10/03/2011  . Gout, unspecified 09/22/2008  . GLAUCOMA, LEFT EYE 09/22/2008  . Allergic rhinitis 09/22/2008  . Essential hypertension 07/12/2007  . OSTEOARTHROSIS, LOCAL, PRIMARY, LOWER LEG 07/12/2007    Scot Jun 10/25/2016, 10:59 AM  [address] [place], [place], [address] Phone: [phone]   Fax:  [phone]  Name: Jennifer Huang MRN: [id number] Date of  Birth: [birth year]

## 2016-10-27 DIAGNOSIS — Q150 Congenital glaucoma: Secondary | ICD-10-CM | POA: Diagnosis not present

## 2016-10-27 DIAGNOSIS — H401122 Primary open-angle glaucoma, left eye, moderate stage: Secondary | ICD-10-CM | POA: Diagnosis not present

## 2016-10-27 DIAGNOSIS — H2512 Age-related nuclear cataract, left eye: Secondary | ICD-10-CM | POA: Diagnosis not present

## 2016-10-27 DIAGNOSIS — H33102 Unspecified retinoschisis, left eye: Secondary | ICD-10-CM | POA: Diagnosis not present

## 2016-10-31 ENCOUNTER — Encounter: Payer: Self-pay | Admitting: Physical Therapy

## 2016-10-31 ENCOUNTER — Ambulatory Visit: Payer: Medicare Other | Admitting: Physical Therapy

## 2016-10-31 DIAGNOSIS — M25512 Pain in left shoulder: Secondary | ICD-10-CM | POA: Diagnosis not present

## 2016-10-31 DIAGNOSIS — G8929 Other chronic pain: Secondary | ICD-10-CM | POA: Diagnosis not present

## 2016-10-31 DIAGNOSIS — M62838 Other muscle spasm: Secondary | ICD-10-CM

## 2016-10-31 DIAGNOSIS — M25612 Stiffness of left shoulder, not elsewhere classified: Secondary | ICD-10-CM

## 2016-10-31 NOTE — Therapy (Signed)
[place], [place], [address] Phone: [phone]   Fax:  [phone]  Physical Therapy Treatment  Patient Details  Name: Jennifer Huang MRN: [id number] Date of Birth: 29-Oct-1950 Referring Provider: Dr. Tamala Julian  Encounter Date: 10/31/2016      PT End of Session - 10/31/16 1013    Visit Number 3   PT Start Time 0930   PT Stop Time 1027   PT Time Calculation (min) 57 min   Activity Tolerance Patient tolerated treatment well   Behavior During Therapy [facility] for tasks assessed/performed      Past Medical History:  Diagnosis Date  . Allergy    year around allergies  . Colon polyps    anal polyp  . Gout, unspecified   . Hyperlipidemia   . Primary localized osteoarthrosis, lower leg   . Unspecified essential hypertension   . Unspecified glaucoma(365.9)    Left Eye    Past Surgical History:  Procedure Laterality Date  . COLON SURGERY     anal polyp excised gen'l anesthesia  . COLONOSCOPY  10-14-2010   jacobs-hx TA 2006  . ENUCLEATION     OD 2nd to Glaucoma  . OOPHORECTOMY     Had Blockage  . SALPINGECTOMY     Had Blockage  . TOTAL ABDOMINAL HYSTERECTOMY  2002    There were no vitals filed for this visit.      Subjective Assessment - 10/31/16 0933    Subjective "Im having a knee problem now, the shoulder is coming alone"   Currently in Pain? No/denies   Pain Score 0-No pain                         [facility] Adult PT Treatment/Exercise - 10/31/16 0001      Shoulder Exercises: Standing   External Rotation 10 reps;Theraband;Strengthening  x2   Flexion Both;10 reps;Weights  x2   Shoulder Flexion Weight (lbs) 1   ABduction Both;10 reps;Weights  x2   Shoulder ABduction Weight (lbs) 1   Extension 15 reps;Theraband  x2   Theraband Level (Shoulder Extension) Level 2 (Red)   Row Both;10 reps;Theraband  x2   Theraband Level (Shoulder Row) Level 2 (Red)   Other  Standing Exercises OHP yellow ball 2x10   Other Standing Exercises 3 level cabinet reaches 1lb x10 flex and ext x10     Shoulder Exercises: ROM/Strengthening   UBE (Upper Arm Bike) L2 3 frd/3rev     Modalities   Modalities Moist Heat;Electrical Stimulation     Moist Heat Therapy   Number Minutes Moist Heat 15 Minutes   Moist Heat Location Shoulder     Electrical Stimulation   Electrical Stimulation Location left shoulder/scapular region   Electrical Stimulation Action IFC   Electrical Stimulation Parameters supine   Electrical Stimulation Goals Pain                  PT Short Term Goals - 10/24/16 1454      PT SHORT TERM GOAL #1   Title Pt will demonstrate proper recall of HEP   Time 1   Period Weeks   Status New           PT Long Term Goals - 10/24/16 1454      PT LONG TERM GOAL #1   Title Pt will increase AROM to equal the right shoulder in order to achieve her normal range.   Time  4   Period Weeks   Status New     PT LONG TERM GOAL #2   Title Pt will be able to perform all household activities such as reaching into cabinets and overhead without limitation or pain.   Time 4   Period Weeks   Status New     PT LONG TERM GOAL #3   Title Pt will be increase left shoulder strength to 4+/5 with no increase in symtpoms.   Time 4   Period Weeks   Status New               Plan - 10/31/16 1014    Clinical Impression Statement PT and be hyper verbal at times needing cues to redirect. Completed all exercises well and reports an overall improvement. No reports of increase pain.   Rehab Potential Good   PT Frequency 2x / week   PT Duration 4 weeks   PT Treatment/Interventions ADLs/Self Care Home Management;Cryotherapy;Electrical Stimulation;Iontophoresis 4mg /ml Dexamethasone;Functional mobility training;Ultrasound;Moist Heat;Therapeutic activities;Therapeutic exercise;Patient/family education;Passive range of motion;Manual techniques;Stair training    PT Next Visit Plan Shoulder AROM, therex, manual, pain management      Patient will benefit from skilled therapeutic intervention in order to improve the following deficits and impairments:  Decreased activity tolerance, Decreased mobility, Decreased range of motion, Decreased strength, Hypomobility, Increased muscle spasms, Impaired flexibility, Postural dysfunction, Improper body mechanics, Impaired UE functional use, Pain  Visit Diagnosis: Chronic left shoulder pain  Other muscle spasm  Stiffness of left shoulder, not elsewhere classified     Problem List Patient Active Problem List   Diagnosis Date Noted  . Acute shoulder bursitis, left 09/11/2016  . Left shoulder pain 04/28/2016  . Goiter 03/17/2014  . Routine health maintenance 10/19/2012  . Elevated LDL cholesterol level 10/17/2012  . Abnormal breast finding 10/03/2011  . Gout, unspecified 09/22/2008  . GLAUCOMA, LEFT EYE 09/22/2008  . Allergic rhinitis 09/22/2008  . Essential hypertension 07/12/2007  . OSTEOARTHROSIS, LOCAL, PRIMARY, LOWER LEG 07/12/2007    Scot Jun , PTA 10/31/2016, 10:16 AM  [address] [place], [place], [address] Phone: [phone]   Fax:  [phone]  Name: Jennifer Huang MRN: [id number] Date of Birth: 02/11/1951

## 2016-11-03 ENCOUNTER — Ambulatory Visit: Payer: Medicare Other | Admitting: Physical Therapy

## 2016-11-06 ENCOUNTER — Ambulatory Visit: Payer: Medicare Other | Admitting: Physical Therapy

## 2016-11-06 ENCOUNTER — Encounter: Payer: Self-pay | Admitting: Physical Therapy

## 2016-11-06 DIAGNOSIS — M25612 Stiffness of left shoulder, not elsewhere classified: Secondary | ICD-10-CM | POA: Diagnosis not present

## 2016-11-06 DIAGNOSIS — G8929 Other chronic pain: Secondary | ICD-10-CM

## 2016-11-06 DIAGNOSIS — M62838 Other muscle spasm: Secondary | ICD-10-CM

## 2016-11-06 DIAGNOSIS — M25512 Pain in left shoulder: Secondary | ICD-10-CM

## 2016-11-06 NOTE — Progress Notes (Signed)
[name] [place], [place] [address] Phone: [phone] Subjective:    CC: Left shoulder pain f/u  [id number]  Jennifer Huang is a 66 y.o. female coming in with complaint of left shoulder pain. Found to have more of a subacromial bursitis.Patient did see me one month ago and was given an injection. Patient was also sent to formal physical therapy. Patient was to do conservative therapy as well as oral anti-inflammatories as needed. Patient states she's been making some improvement. Patient states Improvement but still has pain with certain movements. States that reaching across her body seems to be severe. Also reaching above her head. Patient denies any radiation of pain. Is sleeping more currently at night. Feels that she has made some progress and therapy.   patient's rehabilitation notes show the patient has been progressing. Patient did have x-rays of left shoulder x-rays show that patient then moderate osteophytic changes of the acromioclavicular joint but otherwise was unremarkable.  Past Medical History:  Diagnosis Date  . Allergy    year around allergies  . Colon polyps    anal polyp  . Gout, unspecified   . Hyperlipidemia   . Primary localized osteoarthrosis, lower leg   . Unspecified essential hypertension   . Unspecified glaucoma(365.9)    Left Eye   Past Surgical History:  Procedure Laterality Date  . COLON SURGERY     anal polyp excised gen'l anesthesia  . COLONOSCOPY  10-14-2010   jacobs-hx TA 2006  . ENUCLEATION     OD 2nd to Glaucoma  . OOPHORECTOMY     Had Blockage  . SALPINGECTOMY     Had Blockage  . TOTAL ABDOMINAL HYSTERECTOMY  2002   Social History   Social History  . Marital status: Married    Spouse name: N/A  . Number of children: 0  . Years of education: N/A   Occupational History  . Homemaker Retired  . SO - self-employed    Social History Main Topics  . Smoking status: Never Smoker    . Smokeless tobacco: Never Used  . Alcohol use No  . Drug use: No  . Sexual activity: Yes    Partners: Male   Other Topics Concern  . None   Social History Narrative   HSG, @ year college. Married '73. No children. On disability - orthopedic: knee, back, shortened leg.. SO - self-employed.      Dec '12 - many stressors: husband with cancer, brother with pancreatic cancer, housing and financial issues.             Allergies  Allergen Reactions  . Penicillins     REACTION: rash/hives  . Sulfonamide Derivatives     REACTION: rash/hives   Family History  Problem Relation Age of Onset  . Osteoarthritis Mother   . Hypertension Mother   . Emphysema Father   . Diabetes Father   . Hypertension Father   . Arthritis Sister     hip replacments, wrist surgery  . Hyperlipidemia Sister   . Hypertension Sister   . Diabetes Brother   . Hypertension Brother   . Hyperlipidemia Brother   . Cancer - Other Brother   . Heart disease Brother   . Hypertension Brother   . Hyperlipidemia Brother   . Diabetes Brother   . Colon polyps Brother   . Diabetes Brother   . Heart disease Brother   . Hypertension Brother   . Hyperlipidemia Brother   .  Colon polyps Brother   . Esophageal cancer Neg Hx   . Rectal cancer Neg Hx   . Stomach cancer Neg Hx     Past medical history, social, surgical and family history all reviewed in electronic medical record.  No pertanent information unless stated regarding to the chief complaint.   Review of Systems: No headache, visual changes, nausea, vomiting, diarrhea, constipation, dizziness, abdominal pain, skin rash, fevers, chills, night sweats, weight loss, swollen lymph nodes, chest pain, shortness of breath, mood changes.    Objective  Blood pressure 140/86, pulse (!) 58, height 5\' 6"  (1.676 m), weight 175 lb 12.8 oz (79.7 kg).  Systems examined below as of 11/07/16 General: NAD A&O x3 mood, affect normal  HEENT: Pupils left dialated, extraocular  movements intact no nystagmus Respiratory: not short of breath at rest or with speaking Cardiovascular: No lower extremity edema, non tender Skin: Warm dry intact with no signs of infection or rash on extremities or on axial skeleton. Abdomen: Soft nontender, no masses Neuro: Cranial nerves  intact, neurovascularly intact in all extremities with 2+ DTRs and 2+ pulses. Lymph: No lymphadenopathy appreciated today  Gait normal with good balance and coordination.  MSK: Non tender with full range of motion and good stability and symmetric strength and tone of , elbows, wrist,  knee hips and ankles bilaterally.  Arthritic changes of multiple joints Shoulder: Left Inspection reveals no abnormalities, atrophy or asymmetry. Palpation is normal with no tenderness over AC joint or bicipital groove. ROM is full in all planes. 4 out of 5 strength compared to the contralateral side Mild impingement still noted. Speeds and Yergason's tests normal. Positive O'Brien's as well as positive crossover Normal scapular function observed. No painful arc and no drop arm sign. No apprehension sign contralateral shoulder unremarkable         Impression and Recommendations:     This case required medical decision making of moderate complexity.      Note: This dictation was prepared with Dragon dictation along with smaller phrase technology. Any transcriptional errors that result from this process are unintentional.

## 2016-11-06 NOTE — Therapy (Signed)
[place], [place], [address] Phone: [phone]   Fax:  [phone]  Physical Therapy Treatment  Patient Details  Name: Jennifer Huang MRN: [id number] Date of Birth: 1950/10/26 Referring Provider: Dr. Tamala Julian  Encounter Date: 11/06/2016      PT End of Session - 11/06/16 1502    Visit Number 4   Date for PT Re-Evaluation 11/21/16   PT Start Time 1430   PT Stop Time 1515   PT Time Calculation (min) 45 min      Past Medical History:  Diagnosis Date  . Allergy    year around allergies  . Colon polyps    anal polyp  . Gout, unspecified   . Hyperlipidemia   . Primary localized osteoarthrosis, lower leg   . Unspecified essential hypertension   . Unspecified glaucoma(365.9)    Left Eye    Past Surgical History:  Procedure Laterality Date  . COLON SURGERY     anal polyp excised gen'l anesthesia  . COLONOSCOPY  10-14-2010   jacobs-hx TA 2006  . ENUCLEATION     OD 2nd to Glaucoma  . OOPHORECTOMY     Had Blockage  . SALPINGECTOMY     Had Blockage  . TOTAL ABDOMINAL HYSTERECTOMY  2002    There were no vitals filed for this visit.      Subjective Assessment - 11/06/16 1430    Subjective "Shoulder coming alone still has a little quickly in it"   Currently in Pain? No/denies   Pain Score 0-No pain            [facility] PT Assessment - 11/06/16 0001      AROM   Overall AROM  Within functional limits for tasks performed   AROM Assessment Site Shoulder   Right/Left Shoulder Right;Left                     [facility] Adult PT Treatment/Exercise - 11/06/16 0001      Shoulder Exercises: Seated   Other Seated Exercises Rows and lats 25 lb 2x10      Shoulder Exercises: Standing   External Rotation 10 reps;Theraband;Strengthening  x2   Theraband Level (Shoulder External Rotation) Level 1 (Yellow)   Flexion Both;Weights;15 reps   Shoulder Flexion Weight (lbs) 2   ABduction Both;Weights;15 reps   Shoulder ABduction Weight (lbs) 2   Other Standing Exercises 3 level cabinet reaches 2lb x10 flex and abd x10     Shoulder Exercises: ROM/Strengthening   UBE (Upper Arm Bike) L3 3 frd/3rev     Modalities   Modalities Moist Heat     Moist Heat Therapy   Number Minutes Moist Heat 10 Minutes   Moist Heat Location Shoulder                  PT Short Term Goals - 10/24/16 1454      PT SHORT TERM GOAL #1   Title Pt will demonstrate proper recall of HEP   Time 1   Period Weeks   Status New           PT Long Term Goals - 11/06/16 1503      PT LONG TERM GOAL #1   Title Pt will increase AROM to equal the right shoulder in order to achieve her normal range.   Status Achieved     PT LONG TERM GOAL #2   Title Pt will be able to perform all  household activities such as reaching into cabinets and overhead without limitation or pain.   Status Partially Met     PT LONG TERM GOAL #3   Title Pt will be increase left shoulder strength to 4+/5 with no increase in symtpoms.   Status On-going               Plan - 11/06/16 1503    Clinical Impression Statement Pt tolerated treatment well and is progressing towards all goals. Pt able to improve her shoulder ROM to [facility]. Pt does report a little pain with 3 level cabinet reaches when reaching to higher level doing abduction.   Rehab Potential Good   PT Frequency 2x / week   PT Duration 4 weeks   PT Treatment/Interventions ADLs/Self Care Home Management;Cryotherapy;Electrical Stimulation;Iontophoresis 58m/ml Dexamethasone;Functional mobility training;Ultrasound;Moist Heat;Therapeutic activities;Therapeutic exercise;Patient/family education;Passive range of motion;Manual techniques;Stair training   PT Next Visit Plan Shoulder AROM, therex, manual, pain management      Patient will benefit from skilled therapeutic intervention in order to improve the following deficits and impairments:   Decreased activity tolerance, Decreased mobility, Decreased range of motion, Decreased strength, Hypomobility, Increased muscle spasms, Impaired flexibility, Postural dysfunction, Improper body mechanics, Impaired UE functional use, Pain  Visit Diagnosis: Other muscle spasm  Chronic left shoulder pain  Stiffness of left shoulder, not elsewhere classified     Problem List Patient Active Problem List   Diagnosis Date Noted  . Acute shoulder bursitis, left 09/11/2016  . Left shoulder pain 04/28/2016  . Goiter 03/17/2014  . Routine health maintenance 10/19/2012  . Elevated LDL cholesterol level 10/17/2012  . Abnormal breast finding 10/03/2011  . Gout, unspecified 09/22/2008  . GLAUCOMA, LEFT EYE 09/22/2008  . Allergic rhinitis 09/22/2008  . Essential hypertension 07/12/2007  . OSTEOARTHROSIS, LOCAL, PRIMARY, LOWER LEG 07/12/2007    RScot Jun PTA 11/06/2016, 3:07 PM  [address] [address]: [phone]  Fax:  [phone] Name: Jennifer SCARFOMRN: [id number] of Birth: 3Nov 12, 1952

## 2016-11-07 ENCOUNTER — Ambulatory Visit: Payer: Self-pay

## 2016-11-07 ENCOUNTER — Ambulatory Visit (INDEPENDENT_AMBULATORY_CARE_PROVIDER_SITE_OTHER): Payer: Medicare Other | Admitting: Family Medicine

## 2016-11-07 ENCOUNTER — Encounter: Payer: Self-pay | Admitting: Family Medicine

## 2016-11-07 VITALS — BP 140/86 | HR 58 | Ht 66.0 in | Wt 175.8 lb

## 2016-11-07 DIAGNOSIS — M7552 Bursitis of left shoulder: Secondary | ICD-10-CM

## 2016-11-07 DIAGNOSIS — M19012 Primary osteoarthritis, left shoulder: Secondary | ICD-10-CM | POA: Insufficient documentation

## 2016-11-07 NOTE — Assessment & Plan Note (Signed)
Patient having more pain with cross over.  Will ask eye doc if she can have another injection.  Discussed MRI but do not think it is necessary at this time.  See me again if she would like injection.

## 2016-11-07 NOTE — Assessment & Plan Note (Addendum)
Patient is making some improvement but continues to have pain. More pain with crossover test. 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 Patient was considering injection but wants to check with her glaucoma physician before she decides on him. Encourage her to continue the icing regimen and continue to do the home exercises.  Spent  25 minutes with patient face-to-face and had greater than 50% of counseling including as described above in assessment and plan.

## 2016-11-07 NOTE — Patient Instructions (Signed)
Good to see you  [name] is your friend.  pennsaid pinkie amount topically 2 times daily as needed.   Check with your doc and if you would like the kenalog injection we can work you in and just ask for lindsay.  Try to keep hands within peripheral vision.  You know where I am if you need me.

## 2016-11-09 ENCOUNTER — Ambulatory Visit: Payer: Medicare Other | Admitting: Physical Therapy

## 2016-11-16 ENCOUNTER — Encounter: Payer: Self-pay | Admitting: Physical Therapy

## 2016-11-16 ENCOUNTER — Ambulatory Visit: Payer: Medicare Other | Admitting: Physical Therapy

## 2016-11-16 DIAGNOSIS — G8929 Other chronic pain: Secondary | ICD-10-CM

## 2016-11-16 DIAGNOSIS — M62838 Other muscle spasm: Secondary | ICD-10-CM | POA: Diagnosis not present

## 2016-11-16 DIAGNOSIS — M25612 Stiffness of left shoulder, not elsewhere classified: Secondary | ICD-10-CM | POA: Diagnosis not present

## 2016-11-16 DIAGNOSIS — M25512 Pain in left shoulder: Secondary | ICD-10-CM

## 2016-11-16 NOTE — Therapy (Signed)
[place], [place], [address] Phone: [phone]   Fax:  [phone]  Physical Therapy Treatment  Patient Details  Name: MANAL KREUTZER MRN: [id number] Date of Birth: 1950/11/22 Referring Provider: Dr. Tamala Julian  Encounter Date: 11/16/2016      PT End of Session - 11/16/16 1340    Visit Number 5   Date for PT Re-Evaluation 11/21/16   PT Start Time 1300   PT Stop Time 1350   PT Time Calculation (min) 50 min   Activity Tolerance Patient tolerated treatment well   Behavior During Therapy [facility] for tasks assessed/performed      Past Medical History:  Diagnosis Date  . Allergy    year around allergies  . Colon polyps    anal polyp  . Gout, unspecified   . Hyperlipidemia   . Primary localized osteoarthrosis, lower leg   . Unspecified essential hypertension   . Unspecified glaucoma(365.9)    Left Eye    Past Surgical History:  Procedure Laterality Date  . COLON SURGERY     anal polyp excised gen'l anesthesia  . COLONOSCOPY  10-14-2010   jacobs-hx TA 2006  . ENUCLEATION     OD 2nd to Glaucoma  . OOPHORECTOMY     Had Blockage  . SALPINGECTOMY     Had Blockage  . TOTAL ABDOMINAL HYSTERECTOMY  2002    There were no vitals filed for this visit.      Subjective Assessment - 11/16/16 1305    Subjective "The shoulder stills have a little pain their, He said I had a little small tear"   Currently in Pain? No/denies   Pain Score 0-No pain                         [facility] Adult PT Treatment/Exercise - 11/16/16 0001      Shoulder Exercises: Seated   Other Seated Exercises Rows and lats 25 lb 2x10    Other Seated Exercises chest press 10lb 2x10      Shoulder Exercises: Standing   External Rotation 10 reps;Theraband;Strengthening   Theraband Level (Shoulder External Rotation) Level 2 (Red)   Flexion Both;Weights;10 reps  x2   Shoulder Flexion Weight (lbs) 2   ABduction  Both;Weights;10 reps  x2   Shoulder ABduction Weight (lbs) 2   Extension 15 reps;Theraband  x2   Theraband Level (Shoulder Extension) Level 2 (Red)   Other Standing Exercises 3 level cabinet reaches 2lb x10 flex and abd x10     Shoulder Exercises: ROM/Strengthening   UBE (Upper Arm Bike) L3 3 frd/3rev     Modalities   Modalities Moist Heat     Moist Heat Therapy   Number Minutes Moist Heat 10 Minutes   Moist Heat Location Shoulder                  PT Short Term Goals - 10/24/16 1454      PT SHORT TERM GOAL #1   Title Pt will demonstrate proper recall of HEP   Time 1   Period Weeks   Status New           PT Long Term Goals - 11/06/16 1503      PT LONG TERM GOAL #1   Title Pt will increase AROM to equal the right shoulder in order to achieve her normal range.   Status Achieved     PT LONG TERM GOAL #  2   Title Pt will be able to perform all household activities such as reaching into cabinets and overhead without limitation or pain.   Status Partially Met     PT LONG TERM GOAL #3   Title Pt will be increase left shoulder strength to 4+/5 with no increase in symtpoms.   Status On-going               Plan - 11/16/16 1340    Clinical Impression Statement Pt continues to progress towards all goals.  She completed all interventions well, does reports som L shoulder pain at the end range of flexion and abduction.   Rehab Potential Good   PT Frequency 2x / week   PT Duration 4 weeks   PT Treatment/Interventions ADLs/Self Care Home Management;Cryotherapy;Electrical Stimulation;Iontophoresis 22m/ml Dexamethasone;Functional mobility training;Ultrasound;Moist Heat;Therapeutic activities;Therapeutic exercise;Patient/family education;Passive range of motion;Manual techniques;Stair training   PT Next Visit Plan Shoulder AROM, therex, manual, pain management      Patient will benefit from skilled therapeutic intervention in order to improve the following  deficits and impairments:  Decreased activity tolerance, Decreased mobility, Decreased range of motion, Decreased strength, Hypomobility, Increased muscle spasms, Impaired flexibility, Postural dysfunction, Improper body mechanics, Impaired UE functional use, Pain  Visit Diagnosis: Other muscle spasm  Chronic left shoulder pain  Stiffness of left shoulder, not elsewhere classified     Problem List Patient Active Problem List   Diagnosis Date Noted  . 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 Acute shoulder bursitis, left 09/11/2016  . Left shoulder pain 04/28/2016  . Goiter 03/17/2014  . Routine health maintenance 10/19/2012  . Elevated LDL cholesterol level 10/17/2012  . Abnormal breast finding 10/03/2011  . Gout, unspecified 09/22/2008  . GLAUCOMA, LEFT EYE 09/22/2008  . Allergic rhinitis 09/22/2008  . Essential hypertension 07/12/2007  . OSTEOARTHROSIS, LOCAL, PRIMARY, LOWER LEG 07/12/2007    RScot Jun PTA 11/16/2016, 1:42 PM  [place] [address]Phone: [phone]  Fax:  [phone] Name: MMELVINA PANGELINANMRN: [id number] of Birth: [birth year]

## 2016-11-20 ENCOUNTER — Encounter: Payer: Self-pay | Admitting: Physical Therapy

## 2016-11-20 ENCOUNTER — Ambulatory Visit: Payer: Medicare Other | Admitting: Physical Therapy

## 2016-11-20 DIAGNOSIS — M25512 Pain in left shoulder: Secondary | ICD-10-CM | POA: Diagnosis not present

## 2016-11-20 DIAGNOSIS — M25612 Stiffness of left shoulder, not elsewhere classified: Secondary | ICD-10-CM

## 2016-11-20 DIAGNOSIS — M62838 Other muscle spasm: Secondary | ICD-10-CM

## 2016-11-20 DIAGNOSIS — G8929 Other chronic pain: Secondary | ICD-10-CM | POA: Diagnosis not present

## 2016-11-20 NOTE — Therapy (Signed)
[place], [place], [address] Phone: [phone]   Fax:  [phone]  Physical Therapy Treatment  Patient Details  Name: Jennifer Huang MRN: [id number] Date of Birth: Jul 06, 1951 Referring Provider: Dr. Tamala Julian  Encounter Date: 11/20/2016      PT End of Session - 11/20/16 1341    Visit Number 6   Date for PT Re-Evaluation 11/21/16   PT Start Time 1300   PT Stop Time 1345   PT Time Calculation (min) 45 min      Past Medical History:  Diagnosis Date  . Allergy    year around allergies  . Colon polyps    anal polyp  . Gout, unspecified   . Hyperlipidemia   . Primary localized osteoarthrosis, lower leg   . Unspecified essential hypertension   . Unspecified glaucoma(365.9)    Left Eye    Past Surgical History:  Procedure Laterality Date  . COLON SURGERY     anal polyp excised gen'l anesthesia  . COLONOSCOPY  10-14-2010   jacobs-hx TA 2006  . ENUCLEATION     OD 2nd to Glaucoma  . OOPHORECTOMY     Had Blockage  . SALPINGECTOMY     Had Blockage  . TOTAL ABDOMINAL HYSTERECTOMY  2002    There were no vitals filed for this visit.      Subjective Assessment - 11/20/16 1301    Subjective "it feels pretty good, really, I haven't moved it wrong. "   Currently in Pain? No/denies   Pain Score 0-No pain            [facility] PT Assessment - 11/20/16 0001      Strength   Overall Strength Comments 5/5 with right shoulder flexion, abduction, ER, IR. 5/5 left shoulder flexion, abduction, IR, and ER.                     [facility] Adult PT Treatment/Exercise - 11/20/16 0001      Shoulder Exercises: Seated   Other Seated Exercises Rows and lats 25 lb 2x10    Other Seated Exercises chest press 10lb 2x10      Shoulder Exercises: Standing   External Rotation Theraband;10 reps   Theraband Level (Shoulder External Rotation) Level 1 (Yellow)   Flexion Both;Weights;10 reps   Shoulder Flexion Weight (lbs) 2   ABduction Both;Weights;10 reps   Shoulder ABduction Weight (lbs) 2   Extension 20 reps;Theraband   Theraband Level (Shoulder Extension) Level 2 (Red)   Row Theraband;15 reps   Theraband Level (Shoulder Row) Level 2 (Red)   Other Standing Exercises OHP yellow ball 2x10     Shoulder Exercises: ROM/Strengthening   UBE (Upper Arm Bike) L3 3 frd/3rev     Modalities   Modalities Moist Heat     Moist Heat Therapy   Number Minutes Moist Heat 10 Minutes   Moist Heat Location Shoulder                  PT Short Term Goals - 10/24/16 1454      PT SHORT TERM GOAL #1   Title Pt will demonstrate proper recall of HEP   Time 1   Period Weeks   Status New           PT Long Term Goals - 11/20/16 1319      PT LONG TERM GOAL #3   Title Pt will be increase left shoulder strength to 4+/5  with no increase in symtpoms.   Status Achieved               Plan - 11/20/16 1341    Clinical Impression Statement Pt has progressed completing most of her LTG's. Pt tolerated treatment well and pleased with her current functional status.   Rehab Potential Good   PT Frequency 2x / week   PT Duration 4 weeks   PT Treatment/Interventions ADLs/Self Care Home Management;Cryotherapy;Electrical Stimulation;Iontophoresis 4mg /ml Dexamethasone;Functional mobility training;Ultrasound;Moist Heat;Therapeutic activities;Therapeutic exercise;Patient/family education;Passive range of motion;Manual techniques;Stair training   PT Next Visit Plan D/C PT      Patient will benefit from skilled therapeutic intervention in order to improve the following deficits and impairments:  Decreased activity tolerance, Decreased mobility, Decreased range of motion, Decreased strength, Hypomobility, Increased muscle spasms, Impaired flexibility, Postural dysfunction, Improper body mechanics, Impaired UE functional use, Pain  Visit Diagnosis: Other muscle spasm  Chronic left  shoulder pain  Stiffness of left shoulder, not elsewhere classified     Problem List Patient Active Problem List   Diagnosis Date Noted  . 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 Acute shoulder bursitis, left 09/11/2016  . Left shoulder pain 04/28/2016  . Goiter 03/17/2014  . Routine health maintenance 10/19/2012  . Elevated LDL cholesterol level 10/17/2012  . Abnormal breast finding 10/03/2011  . Gout, unspecified 09/22/2008  . GLAUCOMA, LEFT EYE 09/22/2008  . Allergic rhinitis 09/22/2008  . Essential hypertension 07/12/2007  . OSTEOARTHROSIS, LOCAL, PRIMARY, LOWER LEG 07/12/2007    Scot Jun, PTA 11/20/2016, 1:46 PM  [place], [place], [address] Phone: [phone]   Fax:  [phone]  Name: Jennifer Huang MRN: [id number] Date of Birth: December 08, 1950

## 2016-11-22 DIAGNOSIS — J309 Allergic rhinitis, unspecified: Secondary | ICD-10-CM | POA: Diagnosis not present

## 2016-11-22 DIAGNOSIS — Z91030 Bee allergy status: Secondary | ICD-10-CM | POA: Diagnosis not present

## 2017-01-10 ENCOUNTER — Ambulatory Visit (INDEPENDENT_AMBULATORY_CARE_PROVIDER_SITE_OTHER)
Admission: RE | Admit: 2017-01-10 | Discharge: 2017-01-10 | Disposition: A | Discharge status: Home / Self Care | Payer: Medicare Other | Source: Ambulatory Visit | Attending: Family Medicine | Admitting: Family Medicine

## 2017-01-10 ENCOUNTER — Ambulatory Visit (INDEPENDENT_AMBULATORY_CARE_PROVIDER_SITE_OTHER): Payer: Medicare Other | Admitting: Family Medicine

## 2017-01-10 ENCOUNTER — Encounter: Payer: Self-pay | Admitting: Family Medicine

## 2017-01-10 VITALS — BP 130/82 | HR 66 | Ht 66.0 in | Wt 171.4 lb

## 2017-01-10 DIAGNOSIS — M545 Low back pain, unspecified: Secondary | ICD-10-CM

## 2017-01-10 DIAGNOSIS — M5136 Other intervertebral disc degeneration, lumbar region: Secondary | ICD-10-CM | POA: Insufficient documentation

## 2017-01-10 DIAGNOSIS — M7552 Bursitis of left shoulder: Secondary | ICD-10-CM

## 2017-01-10 MED ORDER — GABAPENTIN 100 MG PO CAPS: 200.0000 mg | ORAL_CAPSULE | Freq: Every day | ORAL | 3 refills | Status: DC

## 2017-01-10 NOTE — Assessment & Plan Note (Signed)
I believe the patient's pain seems to be more muscular. We will get an x-ray to further evaluate for any bony abnormality that can be contributing. Help her with some nighttime relief. We discussed icing regimen and home exercises. Patient will start increasing activity as tolerated. Patient will follow-up with me again in 2-3 weeks make sure patient is responding. Worsening symptoms we'll consider either further imaging versus possible formal physical therapy.

## 2017-01-10 NOTE — Patient Instructions (Addendum)
good to see you  Ice 20 minutes 2 times daily. Usually after activity and before bed. Exercises 3 times a week.  pennsaid pinkie amount topically 2 times daily as needed.  Gabapentin 200mg  at night Xray downstairs today  lidoderm patch may help as well.  See me again in 12-15 says if not better

## 2017-01-10 NOTE — Progress Notes (Signed)
[name] [place], [place] [address] Phone: [phone] Subjective:    CC: Left shoulder pain f/u  [id number]  Jennifer Huang is a 66 y.o. female coming in with complaint of left shoulder pain. Found to have more of a subacromial bursitis.Was given the glenohumeral joint injection 3 months ago. 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 We work on consider possibly doing an injection the patient was checking with her glaucoma Dr. Patient has been lost to follow-up for the last 2 months. Patient has gone to formal physical therapy twice more since we have seen her. Patient states Seems to be stable at this time.   patient is also having low back and hip pain. She was working in her yard and unfortunately had an acute pain on the right side of her back. This happened 4 days ago. Since then has been very tight. No significant radiation down the leg. Seems to stay localized. Worse with rotation. Some worse with flexion. Denies any nighttime awakening. States that it is sore enough that can catch her breath sometimes. Rates the severity pain is 8 out of 10 when it occurs. When she is sitting on moving no pain whatsoever. Has not tried any over-the-counter medications.  Past Medical History:  Diagnosis Date  . Allergy    year around allergies  . Colon polyps    anal polyp  . Gout, unspecified   . Hyperlipidemia   . Primary localized osteoarthrosis, lower leg   . Unspecified essential hypertension   . Unspecified glaucoma(365.9)    Left Eye   Past Surgical History:  Procedure Laterality Date  . COLON SURGERY     anal polyp excised gen'l anesthesia  . COLONOSCOPY  10-14-2010   jacobs-hx TA 2006  . ENUCLEATION     OD 2nd to Glaucoma  . OOPHORECTOMY     Had Blockage  . SALPINGECTOMY     Had Blockage  . TOTAL ABDOMINAL HYSTERECTOMY  2002   Social History   Social History  . Marital status: Married    Spouse name: N/A  .  Number of children: 0  . Years of education: N/A   Occupational History  . Homemaker Retired  . SO - self-employed    Social History Main Topics  . Smoking status: Never Smoker  . Smokeless tobacco: Never Used  . Alcohol use No  . Drug use: No  . Sexual activity: Yes    Partners: Male   Other Topics Concern  . None   Social History Narrative   HSG, @ year college. Married '73. No children. On disability - orthopedic: knee, back, shortened leg.. SO - self-employed.      Dec '12 - many stressors: husband with cancer, brother with pancreatic cancer, housing and financial issues.             Allergies  Allergen Reactions  . Penicillins     REACTION: rash/hives  . Sulfonamide Derivatives     REACTION: rash/hives   Family History  Problem Relation Age of Onset  . Osteoarthritis Mother   . Hypertension Mother   . Emphysema Father   . Diabetes Father   . Hypertension Father   . Arthritis Sister     hip replacments, wrist surgery  . Hyperlipidemia Sister   . Hypertension Sister   . Diabetes Brother   . Hypertension Brother   . Hyperlipidemia Brother   . Cancer - Other Brother   . Heart disease Brother   .  Hypertension Brother   . Hyperlipidemia Brother   . Diabetes Brother   . Colon polyps Brother   . Diabetes Brother   . Heart disease Brother   . Hypertension Brother   . Hyperlipidemia Brother   . Colon polyps Brother   . Esophageal cancer Neg Hx   . Rectal cancer Neg Hx   . Stomach cancer Neg Hx     Past medical history, social, surgical and family history all reviewed in electronic medical record.  No pertanent information unless stated regarding to the chief complaint.   Review of Systems: No headache, visual changes, nausea, vomiting, diarrhea, constipation, dizziness, abdominal pain, skin rash, fevers, chills, night sweats, weight loss, swollen lymph nodes,, joint swelling, muscle aches, chest pain, shortness of breath, mood changes.  Positive body aches     Objective  Blood pressure 130/82, pulse 66, height 5\' 6"  (1.676 m), weight 171 lb 6.4 oz (77.7 kg), SpO2 98 %.  Systems examined below as of 01/10/17 General: NAD A&O x3 mood, affect normal  HEENT: Pupils left dialated, extraocular movements intact no nystagmus Respiratory: not short of breath at rest or with speaking Cardiovascular: No lower extremity edema, non tender Skin: Warm dry intact with no signs of infection or rash on extremities or on axial skeleton. Abdomen: Soft nontender, no masses Neuro: Cranial nerves  intact, neurovascularly intact in all extremities with 2+ DTRs and 2+ pulses. Lymph: No lymphadenopathy appreciated today  Gait Mild to moderate antalgic gait MSK: Non tender with full range of motion and good stability and symmetric strength and tone of , elbows, wrist,  knee hips and ankles bilaterally.  Arthritic changes of multiple joints Shoulder: Left Inspection reveals no abnormalities, atrophy or asymmetry. Palpation is normal with no tenderness over AC joint or bicipital groove. ROM is full in all planes. 4 out of 5 strength compared to the contralateral side Continued impingement signs noted Speeds and Yergason's tests normal. Continue positive crossover Normal scapular function observed. No painful arc and no drop arm sign. No apprehension sign contralateral shoulder unremarkable Contralateral shoulder unremarkable  Back Exam:  Inspection: Unremarkable  Motion: Flexion 30 deg worsening pain of the right side, Extension 20 deg worsening pain, Side Bending to 45 deg bilaterally,  Rotation to 35 deg bilaterally  SLR laying: Negative  XSLR laying: Negative  Palpable tenderness: Really tender to palpation in the paraspinal musculature of the lumbar spine on the right side. FABER: Tightness on the right side. Sensory change: Gross sensation intact to all lumbar and sacral dermatomes.  Reflexes: 2+ at both patellar tendons, 2+ at achilles tendons,  Babinski's downgoing.  Strength at foot  Plantar-flexion: 5/5 Dorsi-flexion: 5/5 Eversion: 5/5 Inversion: 5/5  Leg strength  Quad: 5/5 Hamstring: 5/5 Hip flexor: 5/5 Hip abductors: 4 out of 5 but symmetric Gait unremarkable.         Impression and Recommendations:     This case required medical decision making of moderate complexity.      Note: This dictation was prepared with Dragon dictation along with smaller phrase technology. Any transcriptional errors that result from this process are unintentional.

## 2017-01-10 NOTE — Assessment & Plan Note (Signed)
Stable at the moment. 

## 2017-01-22 DIAGNOSIS — H401122 Primary open-angle glaucoma, left eye, moderate stage: Secondary | ICD-10-CM | POA: Diagnosis not present

## 2017-01-28 NOTE — Progress Notes (Deleted)
[facility] [place], [place] [address] Phone: [phone] Subjective:    CC: Left shoulder pain f/u  [id number]  Jennifer Huang is a 66 y.o. female coming in with complaint of left shoulder pain. Found to have more of a subacromial bursitis.Was given the glenohumeral joint injection 3 months ago. 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 We work on consider possibly doing an injection the patient was checking with her glaucoma Dr. Patient has been lost to follow-up for the last 2 months. Patient has gone to formal physical therapy twice more since we have seen her. Patient states Seems to be stable at this time.   patient is also having low back and hip pain. She was working in her yard and unfortunately had an acute pain on the right side of her back. This happened 4 days ago. Since then has been very tight. No significant radiation down the leg. Seems to stay localized. Worse with rotation. Some worse with flexion. Denies any nighttime awakening. States that it is sore enough that can catch her breath sometimes. Rates the severity pain is 8 out of 10 when it occurs. When she is sitting on moving no pain whatsoever. Has not tried any over-the-counter medications.  Past Medical History:  Diagnosis Date  . Allergy    year around allergies  . Colon polyps    anal polyp  . Gout, unspecified   . Hyperlipidemia   . Primary localized osteoarthrosis, lower leg   . Unspecified essential hypertension   . Unspecified glaucoma(365.9)    Left Eye   Past Surgical History:  Procedure Laterality Date  . COLON SURGERY     anal polyp excised gen'l anesthesia  . COLONOSCOPY  10-14-2010   jacobs-hx TA 2006  . ENUCLEATION     OD 2nd to Glaucoma  . OOPHORECTOMY     Had Blockage  . SALPINGECTOMY     Had Blockage  . TOTAL ABDOMINAL HYSTERECTOMY  2002   Social History   Social History  . Marital status: Married    Spouse name: N/A  .  Number of children: 0  . Years of education: N/A   Occupational History  . Homemaker Retired  . SO - self-employed    Social History Main Topics  . Smoking status: Never Smoker  . Smokeless tobacco: Never Used  . Alcohol use No  . Drug use: No  . Sexual activity: Yes    Partners: Male   Other Topics Concern  . Not on file   Social History Narrative   HSG, @ year college. Married '73. No children. On disability - orthopedic: knee, back, shortened leg.. SO - self-employed.      Dec '12 - many stressors: husband with cancer, brother with pancreatic cancer, housing and financial issues.             Allergies  Allergen Reactions  . Penicillins     REACTION: rash/hives  . Sulfonamide Derivatives     REACTION: rash/hives   Family History  Problem Relation Age of Onset  . Osteoarthritis Mother   . Hypertension Mother   . Emphysema Father   . Diabetes Father   . Hypertension Father   . Arthritis Sister     hip replacments, wrist surgery  . Hyperlipidemia Sister   . Hypertension Sister   . Diabetes Brother   . Hypertension Brother   . Hyperlipidemia Brother   . Cancer - Other Brother   . Heart  disease Brother   . Hypertension Brother   . Hyperlipidemia Brother   . Diabetes Brother   . Colon polyps Brother   . Diabetes Brother   . Heart disease Brother   . Hypertension Brother   . Hyperlipidemia Brother   . Colon polyps Brother   . Esophageal cancer Neg Hx   . Rectal cancer Neg Hx   . Stomach cancer Neg Hx     Past medical history, social, surgical and family history all reviewed in electronic medical record.  No pertanent information unless stated regarding to the chief complaint.   Review of Systems: No headache, visual changes, nausea, vomiting, diarrhea, constipation, dizziness, abdominal pain, skin rash, fevers, chills, night sweats, weight loss, swollen lymph nodes,, joint swelling, muscle aches, chest pain, shortness of breath, mood changes.  Positive  body aches    Objective  There were no vitals taken for this visit.  Systems examined below as of 01/28/17 General: NAD A&O x3 mood, affect normal  HEENT: Pupils left dialated, extraocular movements intact no nystagmus Respiratory: not short of breath at rest or with speaking Cardiovascular: No lower extremity edema, non tender Skin: Warm dry intact with no signs of infection or rash on extremities or on axial skeleton. Abdomen: Soft nontender, no masses Neuro: Cranial nerves  intact, neurovascularly intact in all extremities with 2+ DTRs and 2+ pulses. Lymph: No lymphadenopathy appreciated today  Gait Mild to moderate antalgic gait MSK: Non tender with full range of motion and good stability and symmetric strength and tone of , elbows, wrist,  knee hips and ankles bilaterally.  Arthritic changes of multiple joints Shoulder: Left Inspection reveals no abnormalities, atrophy or asymmetry. Palpation is normal with no tenderness over AC joint or bicipital groove. ROM is full in all planes. 4 out of 5 strength compared to the contralateral side Continued impingement signs noted Speeds and Yergason's tests normal. Continue positive crossover Normal scapular function observed. No painful arc and no drop arm sign. No apprehension sign contralateral shoulder unremarkable Contralateral shoulder unremarkable  Back Exam:  Inspection: Unremarkable  Motion: Flexion 30 deg worsening pain of the right side, Extension 20 deg worsening pain, Side Bending to 45 deg bilaterally,  Rotation to 35 deg bilaterally  SLR laying: Negative  XSLR laying: Negative  Palpable tenderness: Really tender to palpation in the paraspinal musculature of the lumbar spine on the right side. FABER: Tightness on the right side. Sensory change: Gross sensation intact to all lumbar and sacral dermatomes.  Reflexes: 2+ at both patellar tendons, 2+ at achilles tendons, Babinski's downgoing.  Strength at foot    Plantar-flexion: 5/5 Dorsi-flexion: 5/5 Eversion: 5/5 Inversion: 5/5  Leg strength  Quad: 5/5 Hamstring: 5/5 Hip flexor: 5/5 Hip abductors: 4 out of 5 but symmetric Gait unremarkable.         Impression and Recommendations:     This case required medical decision making of moderate complexity.      Note: This dictation was prepared with Dragon dictation along with smaller phrase technology. Any transcriptional errors that result from this process are unintentional.

## 2017-01-29 ENCOUNTER — Ambulatory Visit: Payer: Medicare Other | Admitting: Family Medicine

## 2017-01-30 ENCOUNTER — Encounter: Payer: Self-pay | Admitting: Internal Medicine

## 2017-01-30 ENCOUNTER — Ambulatory Visit (INDEPENDENT_AMBULATORY_CARE_PROVIDER_SITE_OTHER): Payer: Medicare Other | Admitting: Internal Medicine

## 2017-01-30 ENCOUNTER — Other Ambulatory Visit (INDEPENDENT_AMBULATORY_CARE_PROVIDER_SITE_OTHER): Payer: Medicare Other

## 2017-01-30 VITALS — BP 130/78 | HR 68 | Temp 98.0°F | Resp 12 | Ht 66.0 in | Wt 172.0 lb

## 2017-01-30 DIAGNOSIS — R7301 Impaired fasting glucose: Secondary | ICD-10-CM

## 2017-01-30 DIAGNOSIS — J301 Allergic rhinitis due to pollen: Secondary | ICD-10-CM

## 2017-01-30 LAB — HEMOGLOBIN A1C: HEMOGLOBIN A1C: 6 % (ref 4.6–6.5)

## 2017-01-30 MED ORDER — AZELASTINE HCL 0.15 % NA SOLN: 2.0000 | Freq: Two times a day (BID) | NASAL | 2 refills | Status: AC | PRN

## 2017-01-30 NOTE — Progress Notes (Signed)
Pre visit review using our clinic review tool, if applicable. No additional management support is needed unless otherwise documented below in the visit note. 

## 2017-01-30 NOTE — Assessment & Plan Note (Signed)
Will stop flonase for worsening glaucoma which this could be causing. Continue singulair and add rx for azelastine nose spray and talked to her about netti pot. She will work with this. Can use benadryl cream around her eye but advised not to use cortisone cream there. No indication for steroids or antibiotics at today's visit.

## 2017-01-30 NOTE — Progress Notes (Signed)
   Subjective:    Patient ID: Jennifer Huang, female    DOB: 1951/04/02, 66 y.o.   MRN: [id number]  HPI The patient is a 66 YO female coming in for allergy changes around her eyes. Started about 1 week ago. She tried using cortisone cream on the area but this just caused burning so she did not try again. She has seen her allergist and they monitor her singulair and flonase which she is using both right now. She does also have glaucoma and has had worsening of her pressures lately. No change to her medicines but they want her to have a procedure which she is leery about due to only having 1 functional eye.   Review of Systems  Constitutional: Negative.   HENT: Positive for congestion, postnasal drip and rhinorrhea. Negative for dental problem, drooling, ear discharge, ear pain, sinus pain, sinus pressure, sore throat, trouble swallowing and voice change.   Eyes: Positive for discharge and visual disturbance. Negative for pain, redness and itching.  Respiratory: Negative.   Cardiovascular: Negative.   Gastrointestinal: Negative.   Musculoskeletal: Negative.   Neurological: Negative.       Objective:   Physical Exam  Constitutional: She is oriented to person, place, and time. She appears well-developed and well-nourished.  HENT:  Head: Normocephalic and atraumatic.  Right Ear: External ear normal.  Left Ear: External ear normal.  Oropharynx with redness and clear drainage, nose without crusting, no sinus pressure.   Eyes: EOM are normal. Pupils are equal, round, and reactive to light.  No conjunctival changes, some mild irritation around the eyes, no swelling and no darkening of the skin.   Neck: Normal range of motion. No JVD present.  Cardiovascular: Normal rate and regular rhythm.   Pulmonary/Chest: Effort normal and breath sounds normal.  Abdominal: Soft.  Lymphadenopathy:    She has no cervical adenopathy.  Neurological: She is alert and oriented to person, place, and time.    Skin: Skin is warm and dry.   Vitals:   01/30/17 1107  BP: 130/78  Pulse: 68  Resp: 12  Temp: 98 F (36.7 C)  TempSrc: Oral  SpO2: 100%  Weight: 172 lb (78 kg)  Height: 5\' 6"  (1.676 m)      Assessment & Plan:

## 2017-01-30 NOTE — Patient Instructions (Signed)
We would like you to stop the flonase.   We have sent in the new nose spray which does not have any steroids in it called azelastine. You can use it twice a day as needed to help with allergies.   It is okay to use benadryl cream or gel around the eye to help with the irritation.   We are checking the labs today for the sugars.

## 2017-02-03 ENCOUNTER — Encounter (HOSPITAL_COMMUNITY): Payer: Self-pay

## 2017-02-03 ENCOUNTER — Emergency Department (HOSPITAL_COMMUNITY)
Admission: EM | Admit: 2017-02-03 | Discharge: 2017-02-03 | Disposition: A | Discharge status: Home / Self Care | Payer: Medicare Other | Attending: Emergency Medicine | Admitting: Emergency Medicine

## 2017-02-03 DIAGNOSIS — M545 Low back pain: Secondary | ICD-10-CM | POA: Diagnosis not present

## 2017-02-03 DIAGNOSIS — I10 Essential (primary) hypertension: Secondary | ICD-10-CM | POA: Insufficient documentation

## 2017-02-03 DIAGNOSIS — G8929 Other chronic pain: Secondary | ICD-10-CM

## 2017-02-03 MED ORDER — TRAMADOL HCL 50 MG PO TABS: 50.0000 mg | ORAL_TABLET | Freq: Four times a day (QID) | ORAL | 0 refills | Status: DC | PRN

## 2017-02-03 MED ORDER — TRAMADOL HCL 50 MG PO TABS
50.0000 mg | ORAL_TABLET | Freq: Once | ORAL | Status: AC
  Administered 2017-02-03: 50 mg via ORAL
  Filled 2017-02-03: qty 1

## 2017-02-03 MED ORDER — NAPROXEN 500 MG PO TABS: 500.0000 mg | ORAL_TABLET | Freq: Two times a day (BID) | ORAL | 0 refills | Status: DC

## 2017-02-03 NOTE — Discharge Instructions (Addendum)
Back Pain:  Stop taking the Gabapentin.   Take the Naproxen as directed. Do not take any other over the counters motrin or advil while taking the naproxen.   Take the Tramadol as directed. It can make you drowsy so do not take it if you are driving.   Self - care:  The application of heat or warm baths can help soothe the pain.  Maintaining your daily activities, including walking, is encourged, as it will help you get better faster than just staying in bed.  Non steroidal anti inflammatory medications including Ibuprofen and naproxen;  These medications help both pain and swelling and are very useful in treating back pain.  They should be taken with food, as they can cause stomach upset, and more seriously, stomach bleeding.    You will need to follow up with  Your primary healthcare provider in 3-4 days for reassessment. You have also been provided the number of an orthopedic doctor you can follow-up with for further evaluation if you have no improvement in your symptoms.  Be aware that if you develop new symptoms, such as a fever, leg weakness, difficulty with or loss of control of your urine or bowels, abdominal pain, or more severe pain, you will need to seek medical attention and  / or return to the Emergency department.

## 2017-02-03 NOTE — ED Triage Notes (Signed)
She states she has seen her pcp recently for low back pain, and her back pain improved with exersise. She states it recurred yesterday "worse than ever after I stepped wrong". She is in no distress.

## 2017-02-03 NOTE — ED Notes (Signed)
WALKED PATIENT DOWN HALL PATIENT O2 STATS REMAINED AT 100% AND HEART RATE OF 75-80

## 2017-02-03 NOTE — ED Notes (Signed)
Pt states that she has been having pain to her right lower back pain radiating to rt hip, pt reports that pain in shooting, aching and pulsating/throbbing. Pt reports when she lays on aching area pain does improve. Pt was given tramadol for pain management. Pt has friend at bedside.

## 2017-02-03 NOTE — ED Provider Notes (Signed)
[facility] DEPT Provider Note   CSN: [id number] Arrival date & time: 02/03/17  1041     History   Chief Complaint Chief Complaint  Patient presents with  . Back Pain    HPI Jennifer Huang is a 66 y.o. female who presents with persistent right sided lower back pain. She states that this has been ongoing since March but that yesterday she began having shooting pains radiating down her RLE. She also reports associated numbness to RLE. She reports difficulty ambulating secondary to pain. Currently her back pain is a 5/10. Her pain is worsened with movement and improved with rest. She took one time dose of Advil yesterday but no other OTC medications. She denies any new trauma or falls. She was seen by her PCP for initial evaluation of back pain when it began in March after she had been doing some gardening. No falls or traumas at that time. XR of lumbar spine was negative for any acute fracture at that time, though did show some degenerative disc disease. She was prescribed Gabapentin and Diclofenac cream with no relief. She did attend PT for 2-3 weeks which temporarily improved pain. She denies any fevers, night sweats, weight loss, history of back surgery, IV drug use, saddle anesthesia, bowel/bladder incontinence, weakness of extremities.   The history is provided by the patient.    Past Medical History:  Diagnosis Date  . Allergy    year around allergies  . Colon polyps    anal polyp  . Gout, unspecified   . Hyperlipidemia   . Primary localized osteoarthrosis, lower leg   . Unspecified essential hypertension   . Unspecified glaucoma(365.9)    Left Eye    Patient Active Problem List   Diagnosis Date Noted  . Low back pain 01/10/2017  . 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 Acute shoulder bursitis, left 09/11/2016  . Left shoulder pain 04/28/2016  . Goiter 03/17/2014  . Routine health maintenance 10/19/2012  . Elevated LDL cholesterol level 10/17/2012   . Abnormal breast finding 10/03/2011  . Gout, unspecified 09/22/2008  . GLAUCOMA, LEFT EYE 09/22/2008  . Allergic rhinitis 09/22/2008  . Essential hypertension 07/12/2007  . OSTEOARTHROSIS, LOCAL, PRIMARY, LOWER LEG 07/12/2007    Past Surgical History:  Procedure Laterality Date  . COLON SURGERY     anal polyp excised gen'l anesthesia  . COLONOSCOPY  10-14-2010   jacobs-hx TA 2006  . ENUCLEATION     OD 2nd to Glaucoma  . OOPHORECTOMY     Had Blockage  . SALPINGECTOMY     Had Blockage  . TOTAL ABDOMINAL HYSTERECTOMY  2002    OB History    Gravida Para Term Preterm AB Living   1 0     1     SAB TAB Ectopic Multiple Live Births   1               Home Medications    Prior to Admission medications   Medication Sig Start Date End Date Taking? Authorizing Provider  amLODipine (NORVASC) 10 MG tablet Take 1 tablet (10 mg total) by mouth daily. 03/30/16   Hoyt Koch, MD  Azelastine HCl 0.15 % SOLN Place 2 sprays into the nose every 12 (twelve) hours as needed. 01/30/17   Hoyt Koch, MD  brimonidine (ALPHAGAN P) 0.1 % SOLN 3 (three) times daily.      Historical Provider, MD  dorzolamide-timolol (COSOPT) 22.3-6.8 MG/ML ophthalmic solution 1 drop 2 (two) times daily.  Historical Provider, MD  gabapentin (NEURONTIN) 100 MG capsule Take 2 capsules (200 mg total) by mouth at bedtime. 01/10/17   Lyndal Pulley, DO  lidocaine (LIDODERM) 5 % Place 1 patch onto the skin daily. Remove & Discard patch within 12 hours or as directed by MD 04/28/16   Hoyt Koch, MD  meloxicam (MOBIC) 15 MG tablet Take 1 tablet (15 mg total) by mouth daily. 09/11/16   Lyndal Pulley, DO  montelukast (SINGULAIR) 10 MG tablet Take 10 mg by mouth every evening. 11/22/16   Historical Provider, MD  naproxen (NAPROSYN) 500 MG tablet Take 1 tablet (500 mg total) by mouth 2 (two) times daily. 02/03/17   Volanda Napoleon, PA-C  traMADol (ULTRAM) 50 MG tablet Take 1 tablet (50 mg total) by  mouth every 6 (six) hours as needed. 02/03/17   Volanda Napoleon, PA-C  travoprost, benzalkonium, (TRAVATAN) 0.004 % ophthalmic solution 1 drop at bedtime.      Historical Provider, MD    Family History Family History  Problem Relation Age of Onset  . Osteoarthritis Mother   . Hypertension Mother   . Emphysema Father   . Diabetes Father   . Hypertension Father   . Arthritis Sister     hip replacments, wrist surgery  . Hyperlipidemia Sister   . Hypertension Sister   . Diabetes Brother   . Hypertension Brother   . Hyperlipidemia Brother   . Cancer - Other Brother   . Heart disease Brother   . Hypertension Brother   . Hyperlipidemia Brother   . Diabetes Brother   . Colon polyps Brother   . Diabetes Brother   . Heart disease Brother   . Hypertension Brother   . Hyperlipidemia Brother   . Colon polyps Brother   . Esophageal cancer Neg Hx   . Rectal cancer Neg Hx   . Stomach cancer Neg Hx     Social History Social History  Substance Use Topics  . Smoking status: Never Smoker  . Smokeless tobacco: Never Used  . Alcohol use No     Allergies   Penicillins and Sulfonamide derivatives   Review of Systems Review of Systems  Constitutional: Negative for fever and unexpected weight change.  Respiratory: Negative for cough and shortness of breath.   Cardiovascular: Negative for chest pain.  Gastrointestinal: Negative for abdominal pain, nausea and vomiting.  Genitourinary: Negative for dysuria.  Musculoskeletal: Positive for back pain. Negative for neck pain.  Skin: Negative for rash.  Neurological: Positive for numbness. Negative for headaches.  All other systems reviewed and are negative.    Physical Exam Updated Vital Signs BP 134/88 (BP Location: Left Arm)   Pulse 66   Temp 98.3 F (36.8 C) (Oral)   Resp 18   Ht 5\' 6"  (1.676 m)   Wt 76.7 kg   SpO2 96%   BMI 27.31 kg/m   Physical Exam  Constitutional: She appears well-developed and well-nourished.    HENT:  Head: Normocephalic and atraumatic.  Eyes: Conjunctivae and EOM are normal. Right eye exhibits no discharge. Left eye exhibits no discharge. No scleral icterus.  Cardiovascular: Normal rate and regular rhythm.   Pulmonary/Chest: Effort normal and breath sounds normal.  Musculoskeletal: She exhibits no deformity.       Cervical back: She exhibits no tenderness.       Thoracic back: She exhibits no tenderness.       Lumbar back: She exhibits no tenderness.  Right lumbar paraspinal tenderness to  palpation.  Flexion intact without difficulty. Limited Extension secondary to pain. No C, T, or L spine midline tenderness.   Neurological: She is alert.  Cranial nerves III-XII intact Follows commands, Moves all extremities  5/5 strength to BUE. 5/5 LLE, Slight decrease in RLE but patient reports difficulty secondary to pain.  Positive SLR on the right. Equal plantar/dorsiflexsion of BLE Normal finger to nose. No dysdiadochokinesia. No pronator drift. No slurred speech. No facial droop.   Skin: Skin is warm and dry. No rash noted.  Psychiatric: She has a normal mood and affect. Her speech is normal and behavior is normal.     ED Treatments / Results  Labs (all labs ordered are listed, but only abnormal results are displayed) Labs Reviewed - No data to display  EKG  EKG Interpretation None       Radiology No results found.  Procedures Procedures (including critical care time)  Medications Ordered in ED Medications  traMADol (ULTRAM) tablet 50 mg (50 mg Oral Given 02/03/17 1319)     Initial Impression / Assessment and Plan / ED Course  I have reviewed the triage vital signs and the nursing notes.  Pertinent labs & imaging results that were available during my care of the patient were reviewed by me and considered in my medical decision making (see chart for details).    Patient with presents with back pain. No red flag symptoms. No bowel/bladder incontinence, no  numbness/weakness of extremities, no saddle anesthesia. No fever, night sweats or weight loss. No history of cancer. No IVDA. Seen by her PCP when back pain started and had XR negative for fracture. No relief with diclofenac and Gabapentin.   Concern for sciatica given recurrence and distribution of pain. Also consider herniated disc vs musculoskeletal strain vs mechanical back pain. No indications for imaging at this time given negative XR at PCP's office on 01/10/17 and lack of new falls or trauma. Plan to give Tramadol in the ED for symptomatic relief.   2:05 PM: Re-eval: Patient reports improvement of pain after Tramadol. Will ambulate in the department.   2:15 PM: Patient was able to ambulate in the department. Will send patient home with Tramadol and Naproxen for symptomatic relief. Patient has been instructed by her Ophthalmologist not to have steroids.  Instructed patient to follow-up with PCP in 3-4 days. Patient given ortho referral for follow-up. Return precautions discussed. Patient expresses understanding and agreement to plan.    Final Clinical Impressions(s) / ED Diagnoses   Final diagnoses:  Chronic right-sided low back pain, with sciatica presence unspecified    New Prescriptions Discharge Medication List as of 02/03/2017  2:18 PM    START taking these medications   Details  traMADol (ULTRAM) 50 MG tablet Take 1 tablet (50 mg total) by mouth every 6 (six) hours as needed., Starting Sat 02/03/2017, Print         Volanda Napoleon, PA-C 02/03/17 2053    Malvin Johns, MD 02/04/17 [phone]

## 2017-02-07 ENCOUNTER — Encounter: Payer: Self-pay | Admitting: Internal Medicine

## 2017-02-07 ENCOUNTER — Ambulatory Visit (INDEPENDENT_AMBULATORY_CARE_PROVIDER_SITE_OTHER): Payer: Medicare Other | Admitting: Internal Medicine

## 2017-02-07 DIAGNOSIS — M5441 Lumbago with sciatica, right side: Secondary | ICD-10-CM

## 2017-02-07 MED ORDER — METHOCARBAMOL 500 MG PO TABS: 250.0000 mg | ORAL_TABLET | Freq: Three times a day (TID) | ORAL | 0 refills | Status: DC | PRN

## 2017-02-07 NOTE — Progress Notes (Signed)
   Subjective:    Patient ID: Jennifer Huang, female    DOB: 24-Jul-1951, 66 y.o.   MRN: [id number]  HPI The patient is a 66 YO female coming in for ER follow up (in for injury to her back, new numbness in her right leg and worsening pain). She was given naproxen and tramadol which she has tried for the back. They have not gotten her much relief. She also had meloxicam but does not think she is taking this too. She has used some pennsaid which is helping some but makes her itch some. She is using heat too which helps as well but only for a short time. She denies fevers or chills. No recurrent falls. She is still having some numbness in the right leg and pain down the right leg. She is having some pain in the low back right side as well although this is gradually improving.   Review of Systems  Constitutional: Positive for activity change. Negative for appetite change, chills, fatigue, fever and unexpected weight change.  Respiratory: Negative.   Cardiovascular: Negative.   Gastrointestinal: Negative.   Musculoskeletal: Positive for arthralgias, back pain and myalgias. Negative for gait problem, joint swelling, neck pain and neck stiffness.  Skin: Negative.   Neurological: Positive for numbness. Negative for dizziness, seizures, facial asymmetry, speech difficulty, weakness, light-headedness and headaches.  Psychiatric/Behavioral: Negative.       Objective:   Physical Exam  Constitutional: She is oriented to person, place, and time. She appears well-developed and well-nourished.  HENT:  Head: Normocephalic and atraumatic.  Eyes: EOM are normal.  Neck: Normal range of motion.  Cardiovascular: Normal rate and regular rhythm.   Pulmonary/Chest: Effort normal. No respiratory distress. She has no wheezes. She has no rales.  Abdominal: Soft. She exhibits no distension. There is no tenderness. There is no rebound.  Musculoskeletal: She exhibits tenderness.  Pain in the paraspinal lumbar region  right, no pain spinally. No groin pain right. Some change in sensation, no weakness in the RLE.   Neurological: She is alert and oriented to person, place, and time. Coordination normal.  Skin: Skin is warm and dry.   Vitals:   02/07/17 0906  BP: 132/74  Pulse: 61  Resp: 14  Temp: 98.2 F (36.8 C)  TempSrc: Oral  SpO2: 100%  Weight: 166 lb (75.3 kg)  Height: 5\' 6"  (1.676 m)      Assessment & Plan:

## 2017-02-07 NOTE — Assessment & Plan Note (Signed)
With right sciatica at this time due to injury. Based on physical exam today I do not feel she needs any imaging today. Rx for methocarbamol and went over medications with her. She is not to be taking meloxicam and naproxen at the same time and unclear why ER prescribed NSAID when she already had one prescribed for her. This would increase her risk of stomach ulcer and bleeding. She is not a candidate for steroid burst with her severe glaucoma.

## 2017-02-07 NOTE — Patient Instructions (Addendum)
We have sent in the muscle relaxer called methocarbamol (robaxin) that you can take 1/2 to 1 pill up to 3 times per day. This medicine can make you sleepy or drowsy so be careful after taking it until you know how it will affect you.   Back Exercises The following exercises strengthen the muscles that help to support the back. They also help to keep the lower back flexible. Doing these exercises can help to prevent back pain or lessen existing pain. If you have back pain or discomfort, try doing these exercises 2-3 times each day or as told by your health care provider. When the pain goes away, do them once each day, but increase the number of times that you repeat the steps for each exercise (do more repetitions). If you do not have back pain or discomfort, do these exercises once each day or as told by your health care provider. Exercises Single Knee to Chest   Repeat these steps 3-5 times for each leg: 1. Lie on your back on a firm bed or the floor with your legs extended. 2. Bring one knee to your chest. Your other leg should stay extended and in contact with the floor. 3. Hold your knee in place by grabbing your knee or thigh. 4. Pull on your knee until you feel a gentle stretch in your lower back. 5. Hold the stretch for 10-30 seconds. 6. Slowly release and straighten your leg. Pelvic Tilt   Repeat these steps 5-10 times: 1. Lie on your back on a firm bed or the floor with your legs extended. 2. Bend your knees so they are pointing toward the ceiling and your feet are flat on the floor. 3. Tighten your lower abdominal muscles to press your lower back against the floor. This motion will tilt your pelvis so your tailbone points up toward the ceiling instead of pointing to your feet or the floor. 4. With gentle tension and even breathing, hold this position for 5-10 seconds. Cat-Cow   Repeat these steps until your lower back becomes more flexible: 1. Get into a hands-and-knees position on  a firm surface. Keep your hands under your shoulders, and keep your knees under your hips. You may place padding under your knees for comfort. 2. Let your head hang down, and point your tailbone toward the floor so your lower back becomes rounded like the back of a cat. 3. Hold this position for 5 seconds. 4. Slowly lift your head and point your tailbone up toward the ceiling so your back forms a sagging arch like the back of a cow. 5. Hold this position for 5 seconds. Press-Ups   Repeat these steps 5-10 times: 1. Lie on your abdomen (face-down) on the floor. 2. Place your palms near your head, about shoulder-width apart. 3. While you keep your back as relaxed as possible and keep your hips on the floor, slowly straighten your arms to raise the top half of your body and lift your shoulders. Do not use your back muscles to raise your upper torso. You may adjust the placement of your hands to make yourself more comfortable. 4. Hold this position for 5 seconds while you keep your back relaxed. 5. Slowly return to lying flat on the floor. Bridges   Repeat these steps 10 times: 1. Lie on your back on a firm surface. 2. Bend your knees so they are pointing toward the ceiling and your feet are flat on the floor. 3. Tighten your buttocks muscles  and lift your buttocks off of the floor until your waist is at almost the same height as your knees. You should feel the muscles working in your buttocks and the back of your thighs. If you do not feel these muscles, slide your feet 1-2 inches farther away from your buttocks. 4. Hold this position for 3-5 seconds. 5. Slowly lower your hips to the starting position, and allow your buttocks muscles to relax completely. If this exercise is too easy, try doing it with your arms crossed over your chest. Abdominal Crunches   Repeat these steps 5-10 times: 1. Lie on your back on a firm bed or the floor with your legs extended. 2. Bend your knees so they are  pointing toward the ceiling and your feet are flat on the floor. 3. Cross your arms over your chest. 4. Tip your chin slightly toward your chest without bending your neck. 5. Tighten your abdominal muscles and slowly raise your trunk (torso) high enough to lift your shoulder blades a tiny bit off of the floor. Avoid raising your torso higher than that, because it can put too much stress on your low back and it does not help to strengthen your abdominal muscles. 6. Slowly return to your starting position. Back Lifts  Repeat these steps 5-10 times: 1. Lie on your abdomen (face-down) with your arms at your sides, and rest your forehead on the floor. 2. Tighten the muscles in your legs and your buttocks. 3. Slowly lift your chest off of the floor while you keep your hips pressed to the floor. Keep the back of your head in line with the curve in your back. Your eyes should be looking at the floor. 4. Hold this position for 3-5 seconds. 5. Slowly return to your starting position. Contact a health care provider if:  Your back pain or discomfort gets much worse when you do an exercise.  Your back pain or discomfort does not lessen within 2 hours after you exercise. If you have any of these problems, stop doing these exercises right away. Do not do them again unless your health care provider says that you can. Get help right away if:  You develop sudden, severe back pain. If this happens, stop doing the exercises right away. Do not do them again unless your health care provider says that you can. This information is not intended to replace advice given to you by your health care provider. Make sure you discuss any questions you have with your health care provider. Document Released: 11/16/2004 Document Revised: 02/16/2016 Document Reviewed: 12/03/2014 Elsevier Interactive Patient Education  2017 [employer].

## 2017-02-07 NOTE — Progress Notes (Signed)
Pre visit review using our clinic review tool, if applicable. No additional management support is needed unless otherwise documented below in the visit note. 

## 2017-02-09 DIAGNOSIS — G8929 Other chronic pain: Secondary | ICD-10-CM | POA: Diagnosis not present

## 2017-02-09 DIAGNOSIS — M5416 Radiculopathy, lumbar region: Secondary | ICD-10-CM | POA: Diagnosis not present

## 2017-02-09 DIAGNOSIS — M5431 Sciatica, right side: Secondary | ICD-10-CM | POA: Diagnosis not present

## 2017-02-09 DIAGNOSIS — M4726 Other spondylosis with radiculopathy, lumbar region: Secondary | ICD-10-CM | POA: Diagnosis not present

## 2017-02-09 DIAGNOSIS — S39012A Strain of muscle, fascia and tendon of lower back, initial encounter: Secondary | ICD-10-CM | POA: Diagnosis not present

## 2017-02-09 DIAGNOSIS — M549 Dorsalgia, unspecified: Secondary | ICD-10-CM | POA: Diagnosis not present

## 2017-02-12 ENCOUNTER — Telehealth: Payer: Self-pay | Admitting: *Deleted

## 2017-02-12 NOTE — Telephone Encounter (Signed)
Notified pt w/MD response. Pt states she had just saw MD last week, and ended up going to ER, and they rx her the tramadol. Verified if she pick the robaxin up that was sent on 4/18. Pt states she did not she will contact [facility] concerning med nad if no better will f/u w/MD.../lmb

## 2017-02-12 NOTE — Telephone Encounter (Signed)
We have never prescribed this for her.

## 2017-02-12 NOTE — Telephone Encounter (Signed)
Rec'd call pt requesting refill on her Tramadol.../lmb 

## 2017-02-14 ENCOUNTER — Telehealth: Payer: Self-pay | Admitting: Internal Medicine

## 2017-02-14 ENCOUNTER — Encounter: Payer: Self-pay | Admitting: Internal Medicine

## 2017-02-14 ENCOUNTER — Ambulatory Visit (INDEPENDENT_AMBULATORY_CARE_PROVIDER_SITE_OTHER): Payer: Medicare Other | Admitting: Internal Medicine

## 2017-02-14 VITALS — BP 142/90 | HR 82 | Temp 98.2°F | Resp 12 | Ht 66.0 in | Wt 168.0 lb

## 2017-02-14 DIAGNOSIS — I10 Essential (primary) hypertension: Secondary | ICD-10-CM | POA: Diagnosis not present

## 2017-02-14 DIAGNOSIS — M5136 Other intervertebral disc degeneration, lumbar region: Secondary | ICD-10-CM | POA: Diagnosis not present

## 2017-02-14 MED ORDER — TAPENTADOL HCL 75 MG PO TABS: 75.0000 mg | ORAL_TABLET | Freq: Four times a day (QID) | ORAL | 0 refills | Status: DC | PRN

## 2017-02-14 NOTE — Telephone Encounter (Signed)
Pt needs a PA for tapentadol HCl (NUCYNTA) 75 MG tablet   [facility] on [address]

## 2017-02-14 NOTE — Patient Instructions (Signed)

## 2017-02-14 NOTE — Progress Notes (Signed)
Subjective:  Patient ID: Jennifer Huang, female    DOB: October 29, 1950  Age: 66 y.o. MRN: [id number]  CC: Back Pain   HPI ASLEE SUCH presents for concerns about chronic low back pain that has been worsening over the last few weeks. She has been seen at [facility] and tells me that she has an MRI scheduled next week there. She complains of the pain that radiates into her right lower extremity and she has numbness in her right lower extremity. She tells me the pain keeps her awake at night and interferes with her activities. She has tried to control the pain with tramadol and naproxen but has not gotten much symptom relief. Denies any recent trauma or injury.  Outpatient Medications Prior to Visit  Medication Sig Dispense Refill  . amLODipine (NORVASC) 10 MG tablet Take 1 tablet (10 mg total) by mouth daily. 90 tablet 3  . Azelastine HCl 0.15 % SOLN Place 2 sprays into the nose every 12 (twelve) hours as needed. 30 mL 2  . brimonidine (ALPHAGAN P) 0.1 % SOLN 3 (three) times daily.      . dorzolamide-timolol (COSOPT) 22.3-6.8 MG/ML ophthalmic solution 1 drop 2 (two) times daily.      Marland Kitchen lidocaine (LIDODERM) 5 % Place 1 patch onto the skin daily. Remove & Discard patch within 12 hours or as directed by MD 30 patch 0  . methocarbamol (ROBAXIN) 500 MG tablet Take 0.5-1 tablets (250-500 mg total) by mouth every 8 (eight) hours as needed for muscle spasms. 30 tablet 0  . montelukast (SINGULAIR) 10 MG tablet Take 10 mg by mouth every evening.  0  . naproxen (NAPROSYN) 500 MG tablet Take 1 tablet (500 mg total) by mouth 2 (two) times daily. 20 tablet 0  . travoprost, benzalkonium, (TRAVATAN) 0.004 % ophthalmic solution 1 drop at bedtime.      . traMADol (ULTRAM) 50 MG tablet Take 1 tablet (50 mg total) by mouth every 6 (six) hours as needed. 15 tablet 0   No facility-administered medications prior to visit.     ROS Review of Systems  Constitutional: Negative.  Negative for chills, fatigue and fever.   HENT: Negative.   Eyes: Negative for visual disturbance.  Respiratory: Negative for cough, chest tightness, shortness of breath and wheezing.   Cardiovascular: Negative for chest pain, palpitations and leg swelling.  Gastrointestinal: Negative for abdominal pain, constipation, diarrhea, nausea and vomiting.  Endocrine: Negative.   Genitourinary: Negative.  Negative for difficulty urinating and enuresis.  Musculoskeletal: Positive for back pain. Negative for arthralgias, joint swelling and myalgias.  Skin: Negative.   Allergic/Immunologic: Negative.   Neurological: Positive for numbness. Negative for dizziness, syncope and weakness.  Hematological: Negative.  Negative for adenopathy. Does not bruise/bleed easily.  Psychiatric/Behavioral: Negative.     Objective:  BP (!) 142/90 (BP Location: Left Arm, Patient Position: Sitting, Cuff Size: Normal)   Pulse 82   Temp 98.2 F (36.8 C) (Oral)   Resp 12   Ht 5\' 6"  (1.676 m)   Wt 168 lb (76.2 kg)   SpO2 99%   BMI 27.12 kg/m   BP Readings from Last 3 Encounters:  02/14/17 (!) 142/90  02/07/17 132/74  02/03/17 134/88    Wt Readings from Last 3 Encounters:  02/14/17 168 lb (76.2 kg)  02/07/17 166 lb (75.3 kg)  02/03/17 169 lb 3 oz (76.7 kg)    Physical Exam  Constitutional: She is oriented to person, place, and time. No distress.  HENT:  Mouth/Throat: Oropharynx is clear and moist. No oropharyngeal exudate.  Eyes: Conjunctivae are normal. Right eye exhibits no discharge. Left eye exhibits no discharge. No scleral icterus.  Neck: Normal range of motion. Neck supple. No JVD present. No tracheal deviation present. No thyromegaly present.  Cardiovascular: Normal rate, regular rhythm, normal heart sounds and intact distal pulses.  Exam reveals no gallop and no friction rub.   No murmur heard. Pulmonary/Chest: Effort normal and breath sounds normal. No stridor. No respiratory distress. She has no wheezes. She has no rales. She exhibits  no tenderness.  Abdominal: Soft. Bowel sounds are normal. She exhibits no distension and no mass. There is no tenderness. There is no rebound and no guarding.  Musculoskeletal: Normal range of motion. She exhibits no edema, tenderness or deformity.  Lymphadenopathy:    She has no cervical adenopathy.  Neurological: She is oriented to person, place, and time. She displays no atrophy, no tremor and normal reflexes. No cranial nerve deficit or sensory deficit. She exhibits normal muscle tone. She displays a negative Romberg sign. She displays no seizure activity. Coordination and gait abnormal.  Reflex Scores:      Tricep reflexes are 0 on the right side and 0 on the left side.      Bicep reflexes are 0 on the right side and 0 on the left side.      Brachioradialis reflexes are 0 on the right side and 0 on the left side.      Patellar reflexes are 0 on the right side and 0 on the left side.      Achilles reflexes are 0 on the right side and 0 on the left side. She uses a cane for walking  +SLR in RLE -SLR in LLE  Skin: Skin is warm and dry. No rash noted. She is not diaphoretic. No erythema. No pallor.  Vitals reviewed.   Lab Results  Component Value Date   WBC 8.1 03/28/2016   HGB 12.1 03/28/2016   HCT 36.5 03/28/2016   PLT 286.0 03/28/2016   GLUCOSE 108 (H) 03/28/2016   CHOL 210 (H) 03/28/2016   TRIG 86.0 03/28/2016   HDL 70.80 03/28/2016   LDLDIRECT 129.6 10/17/2012   LDLCALC 122 (H) 03/28/2016   ALT 15 03/28/2016   AST 16 03/28/2016   NA 139 03/28/2016   K 4.1 03/28/2016   CL 103 03/28/2016   CREATININE 0.82 03/28/2016   BUN 18 03/28/2016   CO2 30 03/28/2016   TSH 0.869 01/11/2015   HGBA1C 6.0 01/30/2017    No results found.  Assessment & Plan:   Teniya was seen today for back pain.  Diagnoses and all orders for this visit:  DDD (degenerative disc disease), lumbar -     tapentadol HCl (NUCYNTA) 75 MG tablet; Take 1 tablet (75 mg total) by mouth every 6 (six)  hours as needed.  Essential hypertension- Her BP is well controlled   I have discontinued Ms. Bares's traMADol. I am also having her start on tapentadol HCl. Additionally, I am having her maintain her brimonidine, dorzolamide-timolol, travoprost (benzalkonium), amLODipine, lidocaine, montelukast, Azelastine HCl, naproxen, and methocarbamol.  Meds ordered this encounter  Medications  . tapentadol HCl (NUCYNTA) 75 MG tablet    Sig: Take 1 tablet (75 mg total) by mouth every 6 (six) hours as needed.    Dispense:  90 tablet    Refill:  0     Follow-up: Return if symptoms worsen or fail to improve.  Scarlette Calico, MD

## 2017-02-14 NOTE — Progress Notes (Signed)
Pre visit review using our clinic review tool, if applicable. No additional management support is needed unless otherwise documented below in the visit note. 

## 2017-02-19 DIAGNOSIS — M5417 Radiculopathy, lumbosacral region: Secondary | ICD-10-CM | POA: Diagnosis not present

## 2017-02-21 DIAGNOSIS — M5136 Other intervertebral disc degeneration, lumbar region: Secondary | ICD-10-CM | POA: Diagnosis not present

## 2017-02-21 DIAGNOSIS — M5126 Other intervertebral disc displacement, lumbar region: Secondary | ICD-10-CM | POA: Diagnosis not present

## 2017-02-21 DIAGNOSIS — M5417 Radiculopathy, lumbosacral region: Secondary | ICD-10-CM | POA: Diagnosis not present

## 2017-02-22 ENCOUNTER — Telehealth: Payer: Self-pay | Admitting: *Deleted

## 2017-02-22 ENCOUNTER — Ambulatory Visit (INDEPENDENT_AMBULATORY_CARE_PROVIDER_SITE_OTHER): Payer: Self-pay | Admitting: Physician Assistant

## 2017-02-22 NOTE — Telephone Encounter (Signed)
Rec'd call pt requesting refills on her Naproxen and robaxin still having ongoing back pain...Jennifer Huang

## 2017-02-23 MED ORDER — NAPROXEN 500 MG PO TABS: 500.0000 mg | ORAL_TABLET | Freq: Two times a day (BID) | ORAL | 0 refills | Status: DC | PRN

## 2017-02-23 MED ORDER — METHOCARBAMOL 500 MG PO TABS: 250.0000 mg | ORAL_TABLET | Freq: Three times a day (TID) | ORAL | 0 refills | Status: DC | PRN

## 2017-02-23 NOTE — Telephone Encounter (Signed)
Called pt no answer LMOM MD approved refills has been sent to [facility]...Jennifer Huang

## 2017-02-23 NOTE — Telephone Encounter (Signed)
Okay to refill? 

## 2017-02-26 NOTE — Telephone Encounter (Signed)
PA started via covermymeds.   Key: [id number]

## 2017-02-27 NOTE — Telephone Encounter (Signed)
LVM to inform patient. Call if she has any questions.

## 2017-02-27 NOTE — Telephone Encounter (Signed)
PA approved.  Will you call patient and inform of same?

## 2017-03-01 DIAGNOSIS — M5417 Radiculopathy, lumbosacral region: Secondary | ICD-10-CM | POA: Diagnosis not present

## 2017-03-14 DIAGNOSIS — H2512 Age-related nuclear cataract, left eye: Secondary | ICD-10-CM | POA: Diagnosis not present

## 2017-03-14 DIAGNOSIS — H401121 Primary open-angle glaucoma, left eye, mild stage: Secondary | ICD-10-CM | POA: Diagnosis not present

## 2017-03-29 ENCOUNTER — Encounter: Payer: Medicare Other | Admitting: Internal Medicine

## 2017-04-19 ENCOUNTER — Encounter: Payer: Self-pay | Admitting: Internal Medicine

## 2017-04-19 ENCOUNTER — Ambulatory Visit (INDEPENDENT_AMBULATORY_CARE_PROVIDER_SITE_OTHER)
Admission: RE | Admit: 2017-04-19 | Discharge: 2017-04-19 | Disposition: A | Discharge status: Home / Self Care | Payer: Medicare Other | Source: Ambulatory Visit | Attending: Internal Medicine | Admitting: Internal Medicine

## 2017-04-19 ENCOUNTER — Other Ambulatory Visit (INDEPENDENT_AMBULATORY_CARE_PROVIDER_SITE_OTHER): Payer: Medicare Other

## 2017-04-19 ENCOUNTER — Ambulatory Visit (INDEPENDENT_AMBULATORY_CARE_PROVIDER_SITE_OTHER): Payer: Medicare Other | Admitting: Internal Medicine

## 2017-04-19 VITALS — BP 128/80 | HR 67 | Ht 66.0 in | Wt 169.0 lb

## 2017-04-19 DIAGNOSIS — R609 Edema, unspecified: Secondary | ICD-10-CM

## 2017-04-19 DIAGNOSIS — E7800 Pure hypercholesterolemia, unspecified: Secondary | ICD-10-CM

## 2017-04-19 DIAGNOSIS — I10 Essential (primary) hypertension: Secondary | ICD-10-CM | POA: Diagnosis not present

## 2017-04-19 LAB — HEPATIC FUNCTION PANEL
ALK PHOS: 61 U/L (ref 39–117)
ALT: 12 U/L (ref 0–35)
AST: 16 U/L (ref 0–37)
Albumin: 4.7 g/dL (ref 3.5–5.2)
BILIRUBIN DIRECT: 0.1 mg/dL (ref 0.0–0.3)
TOTAL PROTEIN: 7.1 g/dL (ref 6.0–8.3)
Total Bilirubin: 0.7 mg/dL (ref 0.2–1.2)

## 2017-04-19 LAB — BASIC METABOLIC PANEL
BUN: 15 mg/dL (ref 6–23)
CALCIUM: 10.1 mg/dL (ref 8.4–10.5)
CHLORIDE: 102 meq/L (ref 96–112)
CO2: 27 meq/L (ref 19–32)
Creatinine, Ser: 0.77 mg/dL (ref 0.40–1.20)
GFR: 96.36 mL/min (ref >60.00)
Glucose, Bld: 98 mg/dL (ref 70–99)
Potassium: 4 mEq/L (ref 3.5–5.1)
SODIUM: 137 meq/L (ref 135–145)

## 2017-04-19 LAB — LIPID PANEL
Cholesterol: 239 mg/dL — ABNORMAL HIGH (ref 0–200)
HDL: 72.8 mg/dL (ref >39.00)
LDL Cholesterol: 136 mg/dL — ABNORMAL HIGH (ref 0–99)
NONHDL: 166.34
Total CHOL/HDL Ratio: 3
Triglycerides: 152 mg/dL — ABNORMAL HIGH (ref 0.0–149.0)
VLDL: 30.4 mg/dL (ref 0.0–40.0)

## 2017-04-19 LAB — CBC WITH DIFFERENTIAL/PLATELET
BASOS ABS: 0 10*3/uL (ref 0.0–0.1)
BASOS PCT: 0.6 % (ref 0.0–3.0)
Eosinophils Absolute: 0.2 10*3/uL (ref 0.0–0.7)
Eosinophils Relative: 2.8 % (ref 0.0–5.0)
HCT: 36.3 % (ref 36.0–46.0)
Hemoglobin: 12.2 g/dL (ref 12.0–15.0)
LYMPHS ABS: 2.7 10*3/uL (ref 0.7–4.0)
Lymphocytes Relative: 35 % (ref 12.0–46.0)
MCHC: 33.6 g/dL (ref 30.0–36.0)
MCV: 88.4 fl (ref 78.0–100.0)
MONOS PCT: 7.3 % (ref 3.0–12.0)
Monocytes Absolute: 0.6 10*3/uL (ref 0.1–1.0)
NEUTROS ABS: 4.1 10*3/uL (ref 1.4–7.7)
NEUTROS PCT: 54.3 % (ref 43.0–77.0)
PLATELETS: 281 10*3/uL (ref 150.0–400.0)
RBC: 4.1 Mil/uL (ref 3.87–5.11)
RDW: 13.5 % (ref 11.5–15.5)
WBC: 7.6 10*3/uL (ref 4.0–10.5)

## 2017-04-19 LAB — URINALYSIS, ROUTINE W REFLEX MICROSCOPIC
Bilirubin Urine: NEGATIVE
Ketones, ur: NEGATIVE
Nitrite: NEGATIVE
PH: 6 (ref 5.0–8.0)
RBC / HPF: NONE SEEN (ref 0–?)
Total Protein, Urine: NEGATIVE
UROBILINOGEN UA: 0.2 (ref 0.0–1.0)
Urine Glucose: NEGATIVE

## 2017-04-19 LAB — TSH: TSH: 1.22 u[IU]/mL (ref 0.35–4.50)

## 2017-04-19 LAB — BRAIN NATRIURETIC PEPTIDE: PRO B NATRI PEPTIDE: 27 pg/mL (ref 0.0–100.0)

## 2017-04-19 MED ORDER — FUROSEMIDE 20 MG PO TABS: 20.0000 mg | ORAL_TABLET | Freq: Every day | ORAL | 11 refills | Status: DC | PRN

## 2017-04-19 NOTE — Addendum Note (Signed)
Addended by: Biagio Borg on: 04/19/2017 02:50 PM   Modules accepted: Orders

## 2017-04-19 NOTE — Assessment & Plan Note (Signed)
Pt requests f/u lipids, for lower chol diet Lab Results  Component Value Date   CHOL 210 (H) 03/28/2016   HDL 70.80 03/28/2016   LDLCALC 122 (H) 03/28/2016   LDLDIRECT 129.6 10/17/2012   TRIG 86.0 03/28/2016   CHOLHDL 3 03/28/2016

## 2017-04-19 NOTE — Assessment & Plan Note (Signed)
stable overall by history and exam, recent data reviewed with pt, and pt to continue medical treatment as before,  to f/u any worsening symptoms or concerns BP Readings from Last 3 Encounters:  04/19/17 128/80  02/14/17 (!) 142/90  02/07/17 132/74

## 2017-04-19 NOTE — Assessment & Plan Note (Signed)
?   Related to retention fluid due to steroid effect? But cant r/o other such as Diast CHF; ecg today reviewed, also for cxr, labs as ordered, and echocardiogram;  Tx with lasix 20 qd prn, f/u in 3 wks

## 2017-04-19 NOTE — Progress Notes (Addendum)
Subjective:    Patient ID: Jennifer Huang, female    DOB: [birth year], 66 y.o.   MRN: [id number]  HPI  Here to f/u with me as PCP is on medical leave; with leg swelling bilat approx 2 weeks, both about the same, moderate with worsening swelling, contant, worse toward the evening, some better in the AM, better with elevation, in fact in the AM is really pretty much resolved.  Has been on amlodipine for years without change, was incresaed from 5 mg to 10 mg at least 2 yrs.  Had fluid pill stopped by PCP sometime prior to mar 2016 but only had swelling recently.  Pt denies chest pain, increased sob or doe, wheezing, orthopnea, PND, palpitations, dizziness or syncope.  No hx of CHF. Has had no prior echo on chart.  Stress test neg for ischemia 2002.  Also S/p ruptured disc with multple ESI injections at [facility], and prior to that has had pain form apr 12 constantly until then, mostly low back pain and right leg.  Seemed to start after a mis-step.  Did also have increased glaucoma pressures with ESi steroid tx.  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 Allergy    year around allergies  . Colon polyps    anal polyp  . Gout, unspecified   . Hyperlipidemia   . Primary localized osteoarthrosis, lower leg   . Unspecified essential hypertension   . Unspecified glaucoma(365.9)    Left Eye   Past Surgical History:  Procedure Laterality Date  . COLON SURGERY     anal polyp excised gen'l anesthesia  . COLONOSCOPY  10-14-2010   jacobs-hx TA 2006  . ENUCLEATION     OD 2nd to Glaucoma  . OOPHORECTOMY     Had Blockage  . SALPINGECTOMY     Had Blockage  . TOTAL ABDOMINAL HYSTERECTOMY  2002      reports that she has never smoked. She has never used smokeless tobacco. She reports that she does not drink alcohol or use drugs. family history includes Arthritis in her sister; Cancer - Other in her brother; Colon polyps in her brother and brother; Diabetes in her brother, brother, brother, and father; Emphysema in her father; Heart disease in her brother and brother; Hyperlipidemia in her brother, brother, brother, and sister; Hypertension in her brother, brother, brother, father, mother, and sister; Osteoarthritis in her mother. Allergies  Allergen Reactions  . Penicillins     REACTION: rash/hives  . Sulfonamide Derivatives     REACTION: rash/hives   Current Outpatient Prescriptions on File Prior to Visit  Medication Sig Dispense Refill  . amLODipine (NORVASC) 10 MG tablet Take 1 tablet (10 mg total) by mouth daily. 90 tablet 3  . Azelastine HCl 0.15 % SOLN Place 2 sprays into the nose every 12 (twelve) hours as needed. 30 mL 2  . brimonidine (ALPHAGAN P) 0.1 % SOLN 3 (three) times daily.      . dorzolamide-timolol (COSOPT) 22.3-6.8 MG/ML ophthalmic solution 1 drop 2 (two) times daily.      Marland Kitchen lidocaine (LIDODERM) 5 % Place 1 patch onto the skin daily. Remove & Discard patch within 12 hours or as directed  by MD 30 patch 0  . methocarbamol (ROBAXIN) 500 MG tablet Take 0.5-1 tablets (250-500 mg total) by mouth every 8 (eight) hours as needed for muscle spasms. 30 tablet 0  . montelukast (SINGULAIR) 10 MG tablet Take 10 mg by mouth every evening.  0  . tapentadol HCl (NUCYNTA) 75 MG tablet Take 1 tablet (75 mg total) by mouth every 6 (six) hours as needed. 90 tablet 0  . travoprost, benzalkonium, (TRAVATAN) 0.004 % ophthalmic solution 1 drop at bedtime.       No current facility-administered medications on file prior to visit.    Review of Systems  Constitutional: Negative for other unusual diaphoresis or sweats HENT: Negative for ear discharge or swelling Eyes: Negative for other  worsening visual disturbances Respiratory: Negative for stridor or other swelling  Gastrointestinal: Negative for worsening distension or other blood Genitourinary: Negative for retention or other urinary change Musculoskeletal: Negative for other MSK pain or swelling Skin: Negative for color change or other new lesions Neurological: Negative for worsening tremors and other numbness  Psychiatric/Behavioral: Negative for worsening agitation or other fatigue All other system neg per pt    Objective:   Physical Exam BP 128/80   Pulse 67   Ht 5\' 6"  (1.676 m)   Wt 169 lb (76.7 kg)   SpO2 99%   BMI 27.28 kg/m  VS noted,  Constitutional: Pt appears in NAD HENT: Head: NCAT.  Right Ear: External ear normal.  Left Ear: External ear normal.  Eyes: . Pupils are equal, round, and reactive to light. Conjunctivae and EOM are normal Nose: without d/c or deformity Neck: Neck supple. Gross normal ROM Cardiovascular: Normal rate and regular rhythm.   Pulmonary/Chest: Effort normal and breath sounds without rales or wheezing.  Abd:  Soft, NT, ND, + BS, no organomegaly Neurological: Pt is alert. At baseline orientation, motor grossly intact Skin: Skin is warm. No rashes, other new lesions, + for trace to 1+ right . lef LE edema to the knees Psychiatric: Pt behavior is normal without agitation  No other exam findings  Lab Results  Component Value Date   WBC 8.1 03/28/2016   HGB 12.1 03/28/2016   HCT 36.5 03/28/2016   PLT 286.0 03/28/2016   GLUCOSE 108 (H) 03/28/2016   CHOL 210 (H) 03/28/2016   TRIG 86.0 03/28/2016   HDL 70.80 03/28/2016   LDLDIRECT 129.6 10/17/2012   LDLCALC 122 (H) 03/28/2016   ALT 15 03/28/2016   AST 16 03/28/2016   NA 139 03/28/2016   K 4.1 03/28/2016   CL 103 03/28/2016   CREATININE 0.82 03/28/2016   BUN 18 03/28/2016   CO2 30 03/28/2016   TSH 0.869 01/11/2015   HGBA1C 6.0 01/30/2017   ECG today I have personally interpreted Sinus  Rhythm      Assessment &  Plan:

## 2017-04-19 NOTE — Patient Instructions (Addendum)
Your EKG was OK today  Please take all new medication as prescribed - the lasix 20 mg (low dose) per day in the AM as needed for persistent leg swelling  Please continue all other medications as before, and refills have been done if requested.  Please have the pharmacy call with any other refills you may need.  Please keep your appointments with your specialists as you may have planned  You will be contacted regarding the referral for: Echocardiogram  Please go to the XRAY Department in the Basement (go straight as you get off the elevator) for the x-ray testing  Please go to the LAB in the Basement (turn left off the elevator) for the tests to be done today  You will be contacted by phone if any changes need to be made immediately.  Otherwise, you will receive a letter about your results with an explanation, but please check with MyChart first.  Please remember to sign up for MyChart if you have not done so, as this will be important to you in the future with finding out test results, communicating by private email, and scheduling acute appointments online when needed.  Please return in 3 weeks, or sooner if needed

## 2017-04-20 ENCOUNTER — Encounter: Payer: Self-pay | Admitting: Internal Medicine

## 2017-05-01 ENCOUNTER — Ambulatory Visit (HOSPITAL_COMMUNITY): Payer: Medicare Other | Attending: Internal Medicine

## 2017-05-01 ENCOUNTER — Other Ambulatory Visit: Payer: Self-pay

## 2017-05-01 DIAGNOSIS — Z8249 Family history of ischemic heart disease and other diseases of the circulatory system: Secondary | ICD-10-CM | POA: Diagnosis not present

## 2017-05-01 DIAGNOSIS — R609 Edema, unspecified: Secondary | ICD-10-CM | POA: Diagnosis not present

## 2017-05-01 DIAGNOSIS — E785 Hyperlipidemia, unspecified: Secondary | ICD-10-CM | POA: Insufficient documentation

## 2017-05-01 DIAGNOSIS — I5031 Acute diastolic (congestive) heart failure: Secondary | ICD-10-CM | POA: Diagnosis not present

## 2017-05-01 DIAGNOSIS — I110 Hypertensive heart disease with heart failure: Secondary | ICD-10-CM | POA: Diagnosis not present

## 2017-05-01 DIAGNOSIS — I083 Combined rheumatic disorders of mitral, aortic and tricuspid valves: Secondary | ICD-10-CM | POA: Insufficient documentation

## 2017-05-03 ENCOUNTER — Encounter: Payer: Self-pay | Admitting: Internal Medicine

## 2017-05-07 DIAGNOSIS — H401121 Primary open-angle glaucoma, left eye, mild stage: Secondary | ICD-10-CM | POA: Diagnosis not present

## 2017-05-10 ENCOUNTER — Ambulatory Visit (INDEPENDENT_AMBULATORY_CARE_PROVIDER_SITE_OTHER): Payer: Medicare Other | Admitting: Internal Medicine

## 2017-05-10 ENCOUNTER — Encounter: Payer: Self-pay | Admitting: Internal Medicine

## 2017-05-10 VITALS — BP 122/74 | HR 70 | Ht 66.0 in | Wt 168.0 lb

## 2017-05-10 DIAGNOSIS — I5032 Chronic diastolic (congestive) heart failure: Secondary | ICD-10-CM

## 2017-05-10 DIAGNOSIS — I10 Essential (primary) hypertension: Secondary | ICD-10-CM

## 2017-05-10 DIAGNOSIS — E7800 Pure hypercholesterolemia, unspecified: Secondary | ICD-10-CM

## 2017-05-10 NOTE — Patient Instructions (Addendum)
Ok to reduce the daily fluid intake to taking fluids if you are thirsty only  You will be contacted regarding the referral for: Nutrition  Please continue all other medications as before, and refills have been done if requested.  Please have the pharmacy call with any other refills you may need.  Please keep your appointments with your specialists as you may have planned  Please return in 3 months, or sooner if needed, to Dr Sharlet Salina

## 2017-05-10 NOTE — Assessment & Plan Note (Signed)
Overall stable, but needs to not push the 8 glasses of h20 per day; ok to cut back to drinking if thirsty, cont lasix 20 qd prn

## 2017-05-10 NOTE — Progress Notes (Signed)
Subjective:    Patient ID: Jennifer Huang, female    DOB: 1951/10/06, 66 y.o.   MRN: [id number]  HPI  Pt denies chest pain, increased sob or doe, wheezing, orthopnea, PND, increased LE swelling though still has some persistent over last several months (better in the AM, worse in the PM), palpitations, dizziness or syncope. Wt Readings from Last 3 Encounters:  05/10/17 168 lb (76.2 kg)  04/19/17 169 lb (76.7 kg)  02/14/17 168 lb (76.2 kg)  Trying to drink over 8 glasses H2o per day b/c she heard this is important.  Hard to lose wt, asks for nutrition referral.   Pt denies polydipsia, polyuria Past Medical History:  Diagnosis Date  . Allergy    year around allergies  . Chronic diastolic (congestive) heart failure ([place]) 05/10/2017  . Colon polyps    anal polyp  . Gout, unspecified   . Hyperlipidemia   . Primary localized osteoarthrosis, lower leg   . Unspecified essential hypertension   . Unspecified glaucoma(365.9)    Left Eye   Past Surgical History:  Procedure Laterality Date  . COLON SURGERY     anal polyp excised gen'l anesthesia  . COLONOSCOPY  10-14-2010   jacobs-hx TA 2006  . ENUCLEATION     OD 2nd to Glaucoma  . OOPHORECTOMY     Had Blockage  . SALPINGECTOMY     Had Blockage  . TOTAL ABDOMINAL HYSTERECTOMY  2002    reports that she has never smoked. She has never used smokeless tobacco. She reports that she does not drink alcohol or use drugs. family history includes Arthritis in her sister; Cancer - Other in her brother; Colon polyps in her brother and brother; Diabetes in her brother, brother, brother, and father; Emphysema in her father; Heart disease in her brother and brother; Hyperlipidemia in her brother, brother, brother, and sister; Hypertension in her brother, brother, brother, father, mother, and sister; Osteoarthritis in her mother. Allergies  Allergen Reactions  . Penicillins     REACTION: rash/hives  . Sulfonamide Derivatives     REACTION:  rash/hives   Current Outpatient Prescriptions on File Prior to Visit  Medication Sig Dispense Refill  . amLODipine (NORVASC) 10 MG tablet Take 1 tablet (10 mg total) by mouth daily. 90 tablet 3  . Azelastine HCl 0.15 % SOLN Place 2 sprays into the nose every 12 (twelve) hours as needed. 30 mL 2  . brimonidine (ALPHAGAN P) 0.1 % SOLN 3 (three) times daily.      . dorzolamide-timolol (COSOPT) 22.3-6.8 MG/ML ophthalmic solution 1 drop 2 (two) times daily.      . furosemide (LASIX) 20 MG tablet Take 1 tablet (20 mg total) by mouth daily as needed. 30 tablet 11  . lidocaine (LIDODERM) 5 % Place 1 patch onto the skin daily. Remove & Discard patch within 12 hours or as directed by MD 30 patch 0  . methocarbamol (ROBAXIN) 500 MG tablet Take 0.5-1 tablets (250-500 mg total) by mouth every 8 (eight) hours as needed for muscle spasms. 30 tablet 0  . montelukast (SINGULAIR) 10 MG tablet Take 10 mg by mouth every evening.  0  . tapentadol HCl (NUCYNTA) 75 MG tablet Take 1 tablet (75 mg total) by mouth every 6 (six) hours as needed. 90 tablet 0  . travoprost, benzalkonium, (TRAVATAN) 0.004 % ophthalmic solution 1 drop at bedtime.       No current facility-administered medications on file prior to visit.    Review of Systems  All otherwise neg per pt    Objective:   Physical Exam BP 122/74   Pulse 70   Ht 5\' 6"  (1.676 m)   Wt 168 lb (76.2 kg)   SpO2 99%   BMI 27.12 kg/m  VS noted,  Constitutional: Pt appears in NAD HENT: Head: NCAT.  Right Ear: External ear normal.  Left Ear: External ear normal.  Eyes: . Pupils are equal, round, and reactive to light. Conjunctivae and EOM are normal Nose: without d/c or deformity Neck: Neck supple. Gross normal ROM Cardiovascular: Normal rate and regular rhythm.   Pulmonary/Chest: Effort normal and breath sounds without rales or wheezing.  Neurological: Pt is alert. At baseline orientation, motor grossly intact Skin: Skin is warm. No rashes, other new  lesions, no LE edema Psychiatric: Pt behavior is normal without agitation  No other exam findings  Lab Results  Component Value Date   WBC 7.6 04/19/2017   HGB 12.2 04/19/2017   HCT 36.3 04/19/2017   PLT 281.0 04/19/2017   GLUCOSE 98 04/19/2017   CHOL 239 (H) 04/19/2017   TRIG 152.0 (H) 04/19/2017   HDL 72.80 04/19/2017   LDLDIRECT 129.6 10/17/2012   LDLCALC 136 (H) 04/19/2017   ALT 12 04/19/2017   AST 16 04/19/2017   NA 137 04/19/2017   K 4.0 04/19/2017   CL 102 04/19/2017   CREATININE 0.77 04/19/2017   BUN 15 04/19/2017   CO2 27 04/19/2017   TSH 1.22 04/19/2017   HGBA1C 6.0 01/30/2017    04/19/2017 2:56 PM 04/19/2017 3:00 PM  PACS Images   Show images for DG Chest 2 View  Study Result   CLINICAL DATA:  Peripheral edema over the last 2 weeks. Recent steroid injection.  EXAM: CHEST  2 VIEW  COMPARISON:  11/05/2007  FINDINGS: Heart size is normal. Mediastinal shadows are normal. The lungs are clear except for linear scar at the right base. No effusions. No sign of pulmonary edema or heart failure. Mild spinal curvature and degenerative change.  IMPRESSION: Linear scar right base. No active disease. No evidence of congestive heart failure by radiography.   Electronically Signed   By: Nelson Chimes M.D.   On: 04/20/2017 07:39    05/03/2017  Transthoracic Echocardiography - summary Impressions:  - LVEF 60-65%, normal wall thickness, normal wall motion, grade 1   DD, indeterminate LV filling pressure, trivial MR, mild TR, RVSP   28 mmHg, normal IVC.    Assessment & Plan:

## 2017-05-10 NOTE — Assessment & Plan Note (Signed)
Mild, for lower chol diet, refer nutrition per pt request, declines statin

## 2017-05-10 NOTE — Assessment & Plan Note (Signed)
/  stable overall by history and exam, recent data reviewed with pt, and pt to continue medical treatment as before,  to f/u any worsening symptoms or concerns BP Readings from Last 3 Encounters:  05/10/17 122/74  04/19/17 128/80  02/14/17 (!) 142/90

## 2017-05-17 ENCOUNTER — Encounter: Payer: Self-pay | Admitting: Internal Medicine

## 2017-05-17 ENCOUNTER — Ambulatory Visit (INDEPENDENT_AMBULATORY_CARE_PROVIDER_SITE_OTHER): Payer: Medicare Other | Admitting: Internal Medicine

## 2017-05-17 VITALS — BP 122/76 | HR 67 | Ht 66.0 in | Wt 171.0 lb

## 2017-05-17 DIAGNOSIS — M25572 Pain in left ankle and joints of left foot: Secondary | ICD-10-CM | POA: Diagnosis not present

## 2017-05-17 DIAGNOSIS — I10 Essential (primary) hypertension: Secondary | ICD-10-CM | POA: Diagnosis not present

## 2017-05-17 MED ORDER — MELOXICAM 15 MG PO TABS: 15.0000 mg | ORAL_TABLET | Freq: Every day | ORAL | 2 refills | Status: DC | PRN

## 2017-05-17 NOTE — Patient Instructions (Signed)
Please take all new medication as prescribed - the anti-inflammatory  You will be contacted regarding the referral for: Dr Raeford Razor Jon Gills medicine, or you can make an appt before leaving today  Please continue all other medications as before, and refills have been done if requested.  Please have the pharmacy call with any other refills you may need.  Please keep your appointments with your specialists as you may have planned

## 2017-05-17 NOTE — Progress Notes (Signed)
Subjective:    Patient ID: Jennifer Huang, female    DOB: 10-Jun-1951, 66 y.o.   MRN: [id number]  HPI  Here for exam with unintentional hitting the lateral left ankle 3 times in the past 2 wks, all of which were mild to mod, but together has left here with 1-2+ swelling, tender with some bruising as well.  Ice helped somewhat, has also tried cortaid.  Has been able to ambulate. Pt denies chest pain, increased sob or doe, wheezing, orthopnea, PND, increased LE swelling, palpitations, dizziness or syncope.   Past Medical History:  Diagnosis Date  . Allergy    year around allergies  . Chronic diastolic (congestive) heart failure ([place]) 05/10/2017  . Colon polyps    anal polyp  . Gout, unspecified   . Hyperlipidemia   . Primary localized osteoarthrosis, lower leg   . Unspecified essential hypertension   . Unspecified glaucoma(365.9)    Left Eye   Past Surgical History:  Procedure Laterality Date  . COLON SURGERY     anal polyp excised gen'l anesthesia  . COLONOSCOPY  10-14-2010   jacobs-hx TA 2006  . ENUCLEATION     OD 2nd to Glaucoma  . OOPHORECTOMY     Had Blockage  . SALPINGECTOMY     Had Blockage  . TOTAL ABDOMINAL HYSTERECTOMY  2002    reports that she has never smoked. She has never used smokeless tobacco. She reports that she does not drink alcohol or use drugs. family history includes Arthritis in her sister; Cancer - Other in her brother; Colon polyps in her brother and brother; Diabetes in her brother, brother, brother, and father; Emphysema in her father; Heart disease in her brother and brother; Hyperlipidemia in her brother, brother, brother, and sister; Hypertension in her brother, brother, brother, father, mother, and sister; Osteoarthritis in her mother. Allergies  Allergen Reactions  . Penicillins     REACTION: rash/hives  . Sulfonamide Derivatives     REACTION: rash/hives   Current Outpatient Prescriptions on File Prior to Visit  Medication Sig Dispense  Refill  . amLODipine (NORVASC) 10 MG tablet Take 1 tablet (10 mg total) by mouth daily. 90 tablet 3  . Azelastine HCl 0.15 % SOLN Place 2 sprays into the nose every 12 (twelve) hours as needed. 30 mL 2  . brimonidine (ALPHAGAN P) 0.1 % SOLN 3 (three) times daily.      . dorzolamide-timolol (COSOPT) 22.3-6.8 MG/ML ophthalmic solution 1 drop 2 (two) times daily.      . furosemide (LASIX) 20 MG tablet Take 1 tablet (20 mg total) by mouth daily as needed. 30 tablet 11  . lidocaine (LIDODERM) 5 % Place 1 patch onto the skin daily. Remove & Discard patch within 12 hours or as directed by MD 30 patch 0  . methocarbamol (ROBAXIN) 500 MG tablet Take 0.5-1 tablets (250-500 mg total) by mouth every 8 (eight) hours as needed for muscle spasms. 30 tablet 0  . montelukast (SINGULAIR) 10 MG tablet Take 10 mg by mouth every evening.  0  . tapentadol HCl (NUCYNTA) 75 MG tablet Take 1 tablet (75 mg total) by mouth every 6 (six) hours as needed. 90 tablet 0  . travoprost, benzalkonium, (TRAVATAN) 0.004 % ophthalmic solution 1 drop at bedtime.       No current facility-administered medications on file prior to visit.    Review of Systems  All otherwise neg per pt    Objective:   Physical Exam BP 122/76  Pulse 67   Ht 5\' 6"  (1.676 m)   Wt 171 lb (77.6 kg)   SpO2 99%   BMI 27.60 kg/m  VS noted,  Constitutional: Pt appears in NAD HENT: Head: NCAT.  Right Ear: External ear normal.  Left Ear: External ear normal.  Eyes: . Pupils are equal, round, and reactive to light. Conjunctivae and EOM are normal Nose: without d/c or deformity Neck: Neck supple. Gross normal ROM Cardiovascular: Normal rate and regular rhythm.   Pulmonary/Chest: Effort normal and breath sounds without rales or wheezing.  Left ankle with lateral only non discrete 2-3 cm swelling and slight bruise, primarily over the lateral malleolus, o/w neurovasc intact Neurological: Pt is alert. At baseline orientation, motor grossly intact Skin:  Skin is warm. No rashes, other new lesions, no LE edema Psychiatric: Pt behavior is normal without agitation  No other exam findings    Assessment & Plan:

## 2017-05-18 ENCOUNTER — Ambulatory Visit (INDEPENDENT_AMBULATORY_CARE_PROVIDER_SITE_OTHER): Payer: Medicare Other | Admitting: Family Medicine

## 2017-05-18 ENCOUNTER — Ambulatory Visit (INDEPENDENT_AMBULATORY_CARE_PROVIDER_SITE_OTHER)
Admission: RE | Admit: 2017-05-18 | Discharge: 2017-05-18 | Disposition: A | Discharge status: Home / Self Care | Payer: Medicare Other | Source: Ambulatory Visit | Attending: Family Medicine | Admitting: Family Medicine

## 2017-05-18 ENCOUNTER — Encounter: Payer: Self-pay | Admitting: Family Medicine

## 2017-05-18 VITALS — BP 124/60 | HR 71 | Temp 98.0°F | Ht 66.0 in | Wt 172.0 lb

## 2017-05-18 DIAGNOSIS — M25572 Pain in left ankle and joints of left foot: Secondary | ICD-10-CM | POA: Diagnosis not present

## 2017-05-18 MED ORDER — GABAPENTIN 100 MG PO CAPS: 100.0000 mg | ORAL_CAPSULE | Freq: Three times a day (TID) | ORAL | 1 refills | Status: DC

## 2017-05-18 NOTE — Patient Instructions (Addendum)
Thank you for coming in,    Please take the gabapentin at e can titrate to a 2 pills after couple days if do not notice a difference. You can titrate up to 3 pills if you don't notice a difference after taking 2 pills. Please try to use the brace whenever you're walking around. We will call with the results from the x-rays. Please follow-up with me in 2-3 weeks if you have not had any improvement.   Please feel free to call with any questions or concerns at any time, at [phone]. --Dr. Raeford Razor

## 2017-05-19 NOTE — Assessment & Plan Note (Signed)
She may have an underlying nerve irritation or even a complex regional pain syndrome with having a trauma to the foot. Scanning and exam are fairly benign. Pain worse with ice will be more consistent with a nerve irritation as well. - Initiate gabapentin 100 mg daily at bedtime and can titrate up to 3 times a day as she tolerates. - Applied an ASO brace - X-rays today - Follow-up in 2-3 weeks if there is no improvement.

## 2017-05-19 NOTE — Progress Notes (Signed)
Jennifer Huang - 66 y.o. female MRN [id number]  Date of birth: Feb 07, 1951  SUBJECTIVE:  Including CC & ROS.  Chief Complaint  Patient presents with  . Ankle Pain    X3 weeks plank fell on her ankle patient states she has hit that ankle on things a few times, ankle is swelling and stiff, nothing helps     Ms. FullerIs a 66 year old female that is presenting with left ankle pain. She reports that the pain has been present for about 3 weeks. She reports that she has repeatedly hit it on different things and has had this continued pain. The pain is on the lateral aspect as well as the anterior aspect of her ankle. She has used ice but that seems to increase the pain. She describes it as burning in nature. She has not taken any medications. She denies any previous history of any ankle injuries.   Review of Systems  Cardiovascular: Negative for leg swelling.  Musculoskeletal: Positive for joint swelling. Negative for arthralgias and myalgias.  Skin: Negative for rash.   otherwise negative  HISTORY: Past Medical, Surgical, Social, and Family History Reviewed & Updated per EMR.   Pertinent Historical Findings include:  Past Medical History:  Diagnosis Date  . Allergy    year around allergies  . Chronic diastolic (congestive) heart failure ([place]) 05/10/2017  . Colon polyps    anal polyp  . Gout, unspecified   . Hyperlipidemia   . Primary localized osteoarthrosis, lower leg   . Unspecified essential hypertension   . Unspecified glaucoma(365.9)    Left Eye    Past Surgical History:  Procedure Laterality Date  . COLON SURGERY     anal polyp excised gen'l anesthesia  . COLONOSCOPY  10-14-2010   jacobs-hx TA 2006  . ENUCLEATION     OD 2nd to Glaucoma  . OOPHORECTOMY     Had Blockage  . SALPINGECTOMY     Had Blockage  . TOTAL ABDOMINAL HYSTERECTOMY  2002    Allergies  Allergen Reactions  . Penicillins     REACTION: rash/hives  . Sulfonamide Derivatives     REACTION: rash/hives     Family History  Problem Relation Age of Onset  . Osteoarthritis Mother   . Hypertension Mother   . Emphysema Father   . Diabetes Father   . Hypertension Father   . Arthritis Sister        hip replacments, wrist surgery  . Hyperlipidemia Sister   . Hypertension Sister   . Diabetes Brother   . Hypertension Brother   . Hyperlipidemia Brother   . Cancer - Other Brother   . Heart disease Brother   . Hypertension Brother   . Hyperlipidemia Brother   . Diabetes Brother   . Colon polyps Brother   . Diabetes Brother   . Heart disease Brother   . Hypertension Brother   . Hyperlipidemia Brother   . Colon polyps Brother   . Esophageal cancer Neg Hx   . Rectal cancer Neg Hx   . Stomach cancer Neg Hx      Social History   Social History  . Marital status: Married    Spouse name: N/A  . Number of children: 0  . Years of education: N/A   Occupational History  . Homemaker Retired  . SO - self-employed    Social History Main Topics  . Smoking status: Never Smoker  . Smokeless tobacco: Never Used  . Alcohol use No  . Drug use:  No  . Sexual activity: Yes    Partners: Male   Other Topics Concern  . Not on file   Social History Narrative   HSG, @ year college. Married '73. No children. On disability - orthopedic: knee, back, shortened leg.. SO - self-employed.      Dec '12 - many stressors: husband with cancer, brother with pancreatic cancer, housing and financial issues.               PHYSICAL EXAM:  VS: BP 124/60 (BP Location: Left Arm, Patient Position: Sitting, Cuff Size: Normal)   Pulse 71   Temp 98 F (36.7 C) (Oral)   Ht 5\' 6"  (1.676 m)   Wt 172 lb (78 kg)   SpO2 100%   BMI 27.76 kg/m  Physical Exam Gen: NAD, alert, cooperative with exam, well-appearing ENT: normal lips, normal nasal mucosa,  Eye: PERRL, normal conjunctiva and lids CV:  no edema, +2 pedal pulses   Resp: no accessory muscle use, non-labored,  GI: no masses or tenderness, no hernia   Skin: no rashes, no areas of induration  Neuro: +2 patellar DTR's, normal sensation to touch Psych:  normal insight, alert and oriented MSK:  Left ankle: No overlying erythema or ecchymosis. Some sensitivity to touch over the anterior ankle. Normal ankle range of motion. No tenderness to palpation of the peroneal tendons. Negative anterior drawer. Negative talar tilt. No abnormal callus formation on the plantar aspect per Neurovascularly intact.  Limited ultrasound: left ankle:  Peroneal tendons are normal in appearance and no subluxation a dynamic testing. Peroneal brevis is normal to attachment to the base of the fifth metatarsal There is no effusion in the joint space. Normal appearance of the common extensor tendons.  Summary: Findings are consistent with a Normal exam  Ultrasound and interpretation by Clearance Coots, MD          ASSESSMENT & PLAN:   Left ankle pain She may have an underlying nerve irritation or even a complex regional pain syndrome with having a trauma to the foot. Scanning and exam are fairly benign. Pain worse with ice will be more consistent with a nerve irritation as well. - Initiate gabapentin 100 mg daily at bedtime and can titrate up to 3 times a day as she tolerates. - Applied an ASO brace - X-rays today - Follow-up in 2-3 weeks if there is no improvement.

## 2017-05-20 NOTE — Assessment & Plan Note (Signed)
With recent trauma,contusion, declines films as xray not open at this time, for mobic prn, refer Sports medicine

## 2017-05-20 NOTE — Assessment & Plan Note (Signed)
stable overall by history and exam, recent data reviewed with pt, and pt to continue medical treatment as before,  to f/u any worsening symptoms or concerns BP Readings from Last 3 Encounters:  05/18/17 124/60  05/17/17 122/76  05/10/17 122/74

## 2017-05-21 ENCOUNTER — Encounter: Payer: Medicare Other | Admitting: Internal Medicine

## 2017-06-11 ENCOUNTER — Ambulatory Visit: Payer: Medicare Other | Admitting: Family Medicine

## 2017-06-12 ENCOUNTER — Ambulatory Visit (INDEPENDENT_AMBULATORY_CARE_PROVIDER_SITE_OTHER): Payer: Medicare Other | Admitting: Family Medicine

## 2017-06-12 ENCOUNTER — Encounter: Payer: Self-pay | Admitting: Family Medicine

## 2017-06-12 VITALS — BP 118/60 | HR 65 | Temp 97.9°F | Ht 66.0 in | Wt 176.0 lb

## 2017-06-12 DIAGNOSIS — M25572 Pain in left ankle and joints of left foot: Secondary | ICD-10-CM | POA: Diagnosis not present

## 2017-06-12 NOTE — Assessment & Plan Note (Signed)
Having improvement of her symptoms. Unclear if this was associated with CRPS. Possible to be biomechanical she has a first ray that is shorter than the second which could be related to her symptoms. She does report improvement with the gabapentin. - she can obtain some over-the-counter orthotics - Provided home exercises  - She can try weaning off the gabapentin over the next 2-3 months. If her symptoms worsen can consider MRI versus EMG versus formal physical therapy.

## 2017-06-12 NOTE — Patient Instructions (Signed)
Thank you for coming in,   Please follow-up with me in 2-3 months if your symptoms do not improve. Please try the exercises on a daily basis to help with the range of motion. He do not have to wear the ankle brace if it does not help you. You can try weaning off the gabapentin over the next couple months if he tolerated.   Please feel free to call with any questions or concerns at any time, at [phone]. --Dr. Raeford Razor

## 2017-06-12 NOTE — Progress Notes (Signed)
Jennifer Huang - 66 y.o. female MRN [id number]  Date of birth: [birth year]  SUBJECTIVE:  Including CC & ROS.  Chief Complaint  Patient presents with  . Follow-up    patient states left ankle is better from last visit. states it still swells and gets stiff from time to time  Jennifer Huang is a 67 year old female is following up for left ankle pain. An ultrasound performed on 7/27 was not revealing for any significant pathology. She was initiated on gabapentin at that time and also provided an ASO brace. I independently reviewed x-rays from 7/27 that were normal in appearance. She has been taking gabapentin and reports being about 70% improved in her symptoms. Has also been wearing compression stockings which is help with the fluid retention. She doesn't know if the ASO braces help very much.     Review of Systems  Cardiovascular: Positive for leg swelling.  Musculoskeletal: Negative for gait problem, joint swelling and myalgias.  Skin: Negative for rash.  Neurological: Negative for weakness and numbness.    HISTORY: Past Medical, Surgical, Social, and Family History Reviewed & Updated per EMR.   Pertinent Historical Findings include:  Past Medical History:  Diagnosis Date  . Allergy    year around allergies  . Chronic diastolic (congestive) heart failure ([place]) 05/10/2017  . Colon polyps    anal polyp  . Gout, unspecified   . Hyperlipidemia   . Primary localized osteoarthrosis, lower leg   . Unspecified essential hypertension   . Unspecified glaucoma(365.9)    Left Eye    Past Surgical History:  Procedure Laterality Date  . COLON SURGERY     anal polyp excised gen'l anesthesia  . COLONOSCOPY  10-14-2010   jacobs-hx TA 2006  . ENUCLEATION     OD 2nd to Glaucoma  . OOPHORECTOMY     Had Blockage  . SALPINGECTOMY     Had Blockage  . TOTAL ABDOMINAL HYSTERECTOMY  2002    Allergies  Allergen Reactions  . Penicillins     REACTION: rash/hives  . Sulfonamide Derivatives    REACTION: rash/hives    Family History  Problem Relation Age of Onset  . Osteoarthritis Mother   . Hypertension Mother   . Emphysema Father   . Diabetes Father   . Hypertension Father   . Arthritis Sister        hip replacments, wrist surgery  . Hyperlipidemia Sister   . Hypertension Sister   . Diabetes Brother   . Hypertension Brother   . Hyperlipidemia Brother   . Cancer - Other Brother   . Heart disease Brother   . Hypertension Brother   . Hyperlipidemia Brother   . Diabetes Brother   . Colon polyps Brother   . Diabetes Brother   . Heart disease Brother   . Hypertension Brother   . Hyperlipidemia Brother   . Colon polyps Brother   . Esophageal cancer Neg Hx   . Rectal cancer Neg Hx   . Stomach cancer Neg Hx      Social History   Social History  . Marital status: Married    Spouse name: N/A  . Number of children: 0  . Years of education: N/A   Occupational History  . Homemaker Retired  . SO - self-employed    Social History Main Topics  . Smoking status: Never Smoker  . Smokeless tobacco: Never Used  . Alcohol use No  . Drug use: No  . Sexual activity: Yes  Partners: Male   Other Topics Concern  . Not on file   Social History Narrative   HSG, @ year college. Married '73. No children. On disability - orthopedic: knee, back, shortened leg.. SO - self-employed.      Dec '12 - many stressors: husband with cancer, brother with pancreatic cancer, housing and financial issues.               PHYSICAL EXAM:  VS: BP 118/60 (BP Location: Left Arm, Patient Position: Sitting, Cuff Size: Normal)   Pulse 65   Temp 97.9 F (36.6 C) (Oral)   Ht 5\' 6"  (1.676 m)   Wt 176 lb (79.8 kg)   SpO2 99%   BMI 28.41 kg/m  Physical Exam Gen: NAD, alert, cooperative with exam, well-appearing ENT: normal lips, normal nasal mucosa,  Eye: normal EOM, normal conjunctiva and lids CV:  no edema, +2 pedal pulses   Resp: no accessory muscle use, non-labored,  Skin: no  rashes, no areas of induration  Neuro: normal tone, normal sensation to touch Psych:  normal insight, alert and oriented MSK:  Right ankle: Normal range of motion. No specific area of tenderness. No swelling or ecchymosis. The great toe is shorter than the second toe. Has some loss the transverse arch. Neurovascularly intact.      ASSESSMENT & PLAN:   Left ankle pain Having improvement of her symptoms. Unclear if this was associated with CRPS. Possible to be biomechanical she has a first ray that is shorter than the second which could be related to her symptoms. She does report improvement with the gabapentin. - she can obtain some over-the-counter orthotics - Provided home exercises  - She can try weaning off the gabapentin over the next 2-3 months. If her symptoms worsen can consider MRI versus EMG versus formal physical therapy.

## 2017-06-19 ENCOUNTER — Other Ambulatory Visit: Payer: Self-pay | Admitting: Internal Medicine

## 2017-06-19 DIAGNOSIS — I10 Essential (primary) hypertension: Secondary | ICD-10-CM

## 2017-06-20 DIAGNOSIS — H401121 Primary open-angle glaucoma, left eye, mild stage: Secondary | ICD-10-CM | POA: Diagnosis not present

## 2017-07-02 DIAGNOSIS — J300 Vasomotor rhinitis: Secondary | ICD-10-CM | POA: Diagnosis not present

## 2017-07-02 DIAGNOSIS — Z91030 Bee allergy status: Secondary | ICD-10-CM | POA: Diagnosis not present

## 2017-07-24 ENCOUNTER — Ambulatory Visit (INDEPENDENT_AMBULATORY_CARE_PROVIDER_SITE_OTHER): Payer: Medicare Other | Admitting: Internal Medicine

## 2017-07-24 ENCOUNTER — Encounter: Payer: Self-pay | Admitting: Internal Medicine

## 2017-07-24 VITALS — BP 136/86 | HR 64 | Temp 98.2°F | Ht 66.0 in | Wt 173.0 lb

## 2017-07-24 DIAGNOSIS — M5136 Other intervertebral disc degeneration, lumbar region: Secondary | ICD-10-CM

## 2017-07-24 DIAGNOSIS — I5032 Chronic diastolic (congestive) heart failure: Secondary | ICD-10-CM | POA: Diagnosis not present

## 2017-07-24 DIAGNOSIS — Z23 Encounter for immunization: Secondary | ICD-10-CM | POA: Diagnosis not present

## 2017-07-24 DIAGNOSIS — I10 Essential (primary) hypertension: Secondary | ICD-10-CM

## 2017-07-24 DIAGNOSIS — Z0000 Encounter for general adult medical examination without abnormal findings: Secondary | ICD-10-CM | POA: Diagnosis not present

## 2017-07-24 NOTE — Progress Notes (Signed)
   Subjective:    Patient ID: Jennifer Huang, female    DOB: 1951/07/18, 66 y.o.   MRN: [id number]  HPI Here for medicare wellness and physical, no new complaints. Please see A/P for status and treatment of chronic medical problems.   Diet: heart healthy Physical activity: sedentary Depression/mood screen: negative Hearing: intact to whispered voice Visual acuity: grossly normal with lens, performs annual eye exam  ADLs: capable Fall risk: none Home safety: good Cognitive evaluation: intact to orientation, naming, recall and repetition EOL planning: adv directives discussed  I have personally reviewed and have noted 1. The patient's medical and social history - reviewed today no changes 2. Their use of alcohol, tobacco or illicit drugs 3. Their current medications and supplements 4. The patient's functional ability including ADL's, fall risks, home safety risks and hearing or visual impairment. 5. Diet and physical activities 6. Evidence for depression or mood disorders 7. Care team reviewed and updated (available in snapshot)  Review of Systems  Constitutional: Negative for activity change, appetite change, fatigue, fever and unexpected weight change.  HENT: Negative.   Eyes: Negative.   Respiratory: Negative for cough, chest tightness and shortness of breath.   Cardiovascular: Negative for chest pain, palpitations and leg swelling.  Gastrointestinal: Negative for abdominal distention, abdominal pain, constipation, diarrhea, nausea and vomiting.  Musculoskeletal: Positive for arthralgias. Negative for back pain, gait problem, joint swelling and myalgias.  Skin: Negative.   Neurological: Negative.   Psychiatric/Behavioral: Negative.       Objective:   Physical Exam  Constitutional: She is oriented to person, place, and time. She appears well-developed and well-nourished.  HENT:  Head: Normocephalic and atraumatic.  Eyes: EOM are normal.  Neck: Normal range of motion.    Cardiovascular: Normal rate and regular rhythm.   Pulmonary/Chest: Effort normal and breath sounds normal. No respiratory distress. She has no wheezes. She has no rales.  Abdominal: Soft. Bowel sounds are normal. She exhibits no distension. There is no tenderness. There is no rebound.  Musculoskeletal: She exhibits no edema.  Neurological: She is alert and oriented to person, place, and time. Coordination normal.  Skin: Skin is warm and dry.  Psychiatric: She has a normal mood and affect.   Vitals:   07/24/17 1300  BP: 136/86  Pulse: 64  Temp: 98.2 F (36.8 C)  TempSrc: Oral  SpO2: 100%  Weight: 173 lb (78.5 kg)  Height: 5\' 6"  (1.676 m)      Assessment & Plan:  Prevnar 13 and flu shot given at visit

## 2017-07-24 NOTE — Patient Instructions (Addendum)
Yoga or tai chi are good for balance.   Health Maintenance, Female Adopting a healthy lifestyle and getting preventive care can go a long way to promote health and wellness. Talk with your health care provider about what schedule of regular examinations is right for you. This is a good chance for you to check in with your provider about disease prevention and staying healthy. In between checkups, there are plenty of things you can do on your own. Experts have done a lot of research about which lifestyle changes and preventive measures are most likely to keep you healthy. Ask your health care provider for more information. Weight and diet Eat a healthy diet  Be sure to include plenty of vegetables, fruits, low-fat dairy products, and lean protein.  Do not eat a lot of foods high in solid fats, added sugars, or salt.  Get regular exercise. This is one of the most important things you can do for your health. ? Most adults should exercise for at least 150 minutes each week. The exercise should increase your heart rate and make you sweat (moderate-intensity exercise). ? Most adults should also do strengthening exercises at least twice a week. This is in addition to the moderate-intensity exercise.  Maintain a healthy weight  Body mass index (BMI) is a measurement that can be used to identify possible weight problems. It estimates body fat based on height and weight. Your health care provider can help determine your BMI and help you achieve or maintain a healthy weight.  For females 98 years of age and older: ? A BMI below 18.5 is considered underweight. ? A BMI of 18.5 to 24.9 is normal. ? A BMI of 25 to 29.9 is considered overweight. ? A BMI of 30 and above is considered obese.  Watch levels of cholesterol and blood lipids  You should start having your blood tested for lipids and cholesterol at 66 years of age, then have this test every 5 years.  You may need to have your cholesterol levels  checked more often if: ? Your lipid or cholesterol levels are high. ? You are older than 66 years of age. ? You are at high risk for heart disease.  Cancer screening Lung Cancer  Lung cancer screening is recommended for adults 75-59 years old who are at high risk for lung cancer because of a history of smoking.  A yearly low-dose CT scan of the lungs is recommended for people who: ? Currently smoke. ? Have quit within the past 15 years. ? Have at least a 30-pack-year history of smoking. A pack year is smoking an average of one pack of cigarettes a day for 1 year.  Yearly screening should continue until it has been 15 years since you quit.  Yearly screening should stop if you develop a health problem that would prevent you from having lung cancer treatment.  Breast Cancer  Practice breast self-awareness. This means understanding how your breasts normally appear and feel.  It also means doing regular breast self-exams. Let your health care provider know about any changes, no matter how small.  If you are in your 20s or 30s, you should have a clinical breast exam (CBE) by a health care provider every 1-3 years as part of a regular health exam.  If you are 27 or older, have a CBE every year. Also consider having a breast X-ray (mammogram) every year.  If you have a family history of breast cancer, talk to your health care provider  about genetic screening.  If you are at high risk for breast cancer, talk to your health care provider about having an MRI and a mammogram every year.  Breast cancer gene (BRCA) assessment is recommended for women who have family members with BRCA-related cancers. BRCA-related cancers include: ? Breast. ? Ovarian. ? Tubal. ? Peritoneal cancers.  Results of the assessment will determine the need for genetic counseling and BRCA1 and BRCA2 testing.  Cervical Cancer Your health care provider may recommend that you be screened regularly for cancer of the  pelvic organs (ovaries, uterus, and vagina). This screening involves a pelvic examination, including checking for microscopic changes to the surface of your cervix (Pap test). You may be encouraged to have this screening done every 3 years, beginning at age 3.  For women ages 67-65, health care providers may recommend pelvic exams and Pap testing every 3 years, or they may recommend the Pap and pelvic exam, combined with testing for human papilloma virus (HPV), every 5 years. Some types of HPV increase your risk of cervical cancer. Testing for HPV may also be done on women of any age with unclear Pap test results.  Other health care providers may not recommend any screening for nonpregnant women who are considered low risk for pelvic cancer and who do not have symptoms. Ask your health care provider if a screening pelvic exam is right for you.  If you have had past treatment for cervical cancer or a condition that could lead to cancer, you need Pap tests and screening for cancer for at least 20 years after your treatment. If Pap tests have been discontinued, your risk factors (such as having a new sexual partner) need to be reassessed to determine if screening should resume. Some women have medical problems that increase the chance of getting cervical cancer. In these cases, your health care provider may recommend more frequent screening and Pap tests.  Colorectal Cancer  This type of cancer can be detected and often prevented.  Routine colorectal cancer screening usually begins at 66 years of age and continues through 66 years of age.  Your health care provider may recommend screening at an earlier age if you have risk factors for colon cancer.  Your health care provider may also recommend using home test kits to check for hidden blood in the stool.  A small camera at the end of a tube can be used to examine your colon directly (sigmoidoscopy or colonoscopy). This is done to check for the  earliest forms of colorectal cancer.  Routine screening usually begins at age 46.  Direct examination of the colon should be repeated every 5-10 years through 66 years of age. However, you may need to be screened more often if early forms of precancerous polyps or small growths are found.  Skin Cancer  Check your skin from head to toe regularly.  Tell your health care provider about any new moles or changes in moles, especially if there is a change in a mole's shape or color.  Also tell your health care provider if you have a mole that is larger than the size of a pencil eraser.  Always use sunscreen. Apply sunscreen liberally and repeatedly throughout the day.  Protect yourself by wearing long sleeves, pants, a wide-brimmed hat, and sunglasses whenever you are outside.  Heart disease, diabetes, and high blood pressure  High blood pressure causes heart disease and increases the risk of stroke. High blood pressure is more likely to develop in: ?  People who have blood pressure in the high end of the normal range (130-139/85-89 mm Hg). ? People who are overweight or obese. ? People who are African American.  If you are 18-39 years of age, have your blood pressure checked every 3-5 years. If you are 40 years of age or older, have your blood pressure checked every year. You should have your blood pressure measured twice-once when you are at a hospital or clinic, and once when you are not at a hospital or clinic. Record the average of the two measurements. To check your blood pressure when you are not at a hospital or clinic, you can use: ? An automated blood pressure machine at a pharmacy. ? A home blood pressure monitor.  If you are between 55 years and 79 years old, ask your health care provider if you should take aspirin to prevent strokes.  Have regular diabetes screenings. This involves taking a blood sample to check your fasting blood sugar level. ? If you are at a normal weight and  have a low risk for diabetes, have this test once every three years after 66 years of age. ? If you are overweight and have a high risk for diabetes, consider being tested at a younger age or more often. Preventing infection Hepatitis B  If you have a higher risk for hepatitis B, you should be screened for this virus. You are considered at high risk for hepatitis B if: ? You were born in a country where hepatitis B is common. Ask your health care provider which countries are considered high risk. ? Your parents were born in a high-risk country, and you have not been immunized against hepatitis B (hepatitis B vaccine). ? You have HIV or AIDS. ? You use needles to inject street drugs. ? You live with someone who has hepatitis B. ? You have had sex with someone who has hepatitis B. ? You get hemodialysis treatment. ? You take certain medicines for conditions, including cancer, organ transplantation, and autoimmune conditions.  Hepatitis C  Blood testing is recommended for: ? Everyone born from 1945 through 1965. ? Anyone with known risk factors for hepatitis C.  Sexually transmitted infections (STIs)  You should be screened for sexually transmitted infections (STIs) including gonorrhea and chlamydia if: ? You are sexually active and are younger than 66 years of age. ? You are older than 66 years of age and your health care provider tells you that you are at risk for this type of infection. ? Your sexual activity has changed since you were last screened and you are at an increased risk for chlamydia or gonorrhea. Ask your health care provider if you are at risk.  If you do not have HIV, but are at risk, it may be recommended that you take a prescription medicine daily to prevent HIV infection. This is called pre-exposure prophylaxis (PrEP). You are considered at risk if: ? You are sexually active and do not regularly use condoms or know the HIV status of your partner(s). ? You take drugs by  injection. ? You are sexually active with a partner who has HIV.  Talk with your health care provider about whether you are at high risk of being infected with HIV. If you choose to begin PrEP, you should first be tested for HIV. You should then be tested every 3 months for as long as you are taking PrEP. Pregnancy  If you are premenopausal and you may become pregnant, ask your health   care provider about preconception counseling.  If you may become pregnant, take 400 to 800 micrograms (mcg) of folic acid every day.  If you want to prevent pregnancy, talk to your health care provider about birth control (contraception). Osteoporosis and menopause  Osteoporosis is a disease in which the bones lose minerals and strength with aging. This can result in serious bone fractures. Your risk for osteoporosis can be identified using a bone density scan.  If you are 38 years of age or older, or if you are at risk for osteoporosis and fractures, ask your health care provider if you should be screened.  Ask your health care provider whether you should take a calcium or vitamin D supplement to lower your risk for osteoporosis.  Menopause may have certain physical symptoms and risks.  Hormone replacement therapy may reduce some of these symptoms and risks. Talk to your health care provider about whether hormone replacement therapy is right for you. Follow these instructions at home:  Schedule regular health, dental, and eye exams.  Stay current with your immunizations.  Do not use any tobacco products including cigarettes, chewing tobacco, or electronic cigarettes.  If you are pregnant, do not drink alcohol.  If you are breastfeeding, limit how much and how often you drink alcohol.  Limit alcohol intake to no more than 1 drink per day for nonpregnant women. One drink equals 12 ounces of beer, 5 ounces of wine, or 1 ounces of hard liquor.  Do not use street drugs.  Do not share needles.  Ask  your health care provider for help if you need support or information about quitting drugs.  Tell your health care provider if you often feel depressed.  Tell your health care provider if you have ever been abused or do not feel safe at home. This information is not intended to replace advice given to you by your health care provider. Make sure you discuss any questions you have with your health care provider. Document Released: 04/24/2011 Document Revised: 03/16/2016 Document Reviewed: 07/13/2015 Elsevier Interactive Patient Education  [employer].

## 2017-07-26 NOTE — Assessment & Plan Note (Addendum)
Taking amlodipine and lasix and BP at goal, recent CMP without need for change.

## 2017-07-26 NOTE — Assessment & Plan Note (Signed)
Using meloxicam and robaxin for pain and these are working well.

## 2017-07-26 NOTE — Assessment & Plan Note (Signed)
No flare today, taking lasix daily.

## 2017-07-26 NOTE — Assessment & Plan Note (Signed)
Flu shot and prevnar 13 given at visit. Tetanus up to date. Colonoscopy and mammogram up to date. Does not need pap smear. Counseled about shingrix and sun safety and mole surveillance. Given 10 year screening recommendations.

## 2017-08-20 DIAGNOSIS — H401121 Primary open-angle glaucoma, left eye, mild stage: Secondary | ICD-10-CM | POA: Diagnosis not present

## 2017-09-12 DIAGNOSIS — Z4421 Encounter for fitting and adjustment of artificial right eye: Secondary | ICD-10-CM | POA: Diagnosis not present

## 2017-09-28 ENCOUNTER — Encounter: Payer: Self-pay | Admitting: Internal Medicine

## 2017-09-28 ENCOUNTER — Ambulatory Visit (INDEPENDENT_AMBULATORY_CARE_PROVIDER_SITE_OTHER): Payer: Medicare Other | Admitting: Internal Medicine

## 2017-09-28 DIAGNOSIS — M5136 Other intervertebral disc degeneration, lumbar region: Secondary | ICD-10-CM | POA: Diagnosis not present

## 2017-09-28 MED ORDER — TAPENTADOL HCL 75 MG PO TABS: 75.0000 mg | ORAL_TABLET | Freq: Every day | ORAL | 0 refills | Status: DC | PRN

## 2017-09-28 NOTE — Progress Notes (Signed)
   Subjective:    Patient ID: Jennifer Huang, female    DOB: 1951-03-06, 66 y.o.   MRN: [id number]  HPI The patient is a 66 YO female coming in for lower back pain. She has gone back and forth to [facility] and tried injections which were not helpful. She tried tramadol which was not helpful. Sometimes radiating into her legs. She was prescribed nucynta which she did not fill due to cost (600 before PA). PA was done and approved but she forgot to fill with everything going on. Went to fill and it was expired.   Review of Systems  Constitutional: Positive for activity change. Negative for appetite change, chills, fatigue, fever and unexpected weight change.  Respiratory: Negative.   Cardiovascular: Negative.   Gastrointestinal: Negative.   Musculoskeletal: Positive for arthralgias, back pain and myalgias. Negative for gait problem and joint swelling.  Skin: Negative.   Neurological: Negative.       Objective:   Physical Exam  Constitutional: She is oriented to person, place, and time. She appears well-developed and well-nourished.  HENT:  Head: Normocephalic and atraumatic.  Eyes: EOM are normal.  Neck: Normal range of motion.  Cardiovascular: Normal rate and regular rhythm.  Pulmonary/Chest: Effort normal and breath sounds normal. No respiratory distress. She has no wheezes. She has no rales.  Abdominal: Soft.  Musculoskeletal: She exhibits tenderness. She exhibits no edema.  Neurological: She is alert and oriented to person, place, and time. Coordination normal.  Skin: Skin is warm and dry.   Vitals:   09/28/17 1558  BP: 114/70  Pulse: 64  Temp: 98.7 F (37.1 C)  TempSrc: Oral  SpO2: 100%  Weight: 172 lb (78 kg)  Height: 5\' 6"  (1.676 m)      Assessment & Plan:

## 2017-09-28 NOTE — Assessment & Plan Note (Signed)
Rx for nucynta to try for pain. Rx for #30 no refills sent in. [place] narcotic database without irregularities.

## 2017-09-28 NOTE — Patient Instructions (Signed)
We have sent in the nucynta to get from the pharmacy.

## 2017-10-11 ENCOUNTER — Encounter: Payer: Self-pay | Admitting: Internal Medicine

## 2017-10-11 DIAGNOSIS — Z1231 Encounter for screening mammogram for malignant neoplasm of breast: Secondary | ICD-10-CM | POA: Diagnosis not present

## 2017-10-12 ENCOUNTER — Encounter: Payer: Self-pay | Admitting: Internal Medicine

## 2017-10-12 NOTE — Progress Notes (Signed)
Abstracted and sent to scan  

## 2017-11-26 DIAGNOSIS — H401121 Primary open-angle glaucoma, left eye, mild stage: Secondary | ICD-10-CM | POA: Diagnosis not present

## 2017-12-28 ENCOUNTER — Telehealth: Payer: Self-pay | Admitting: Internal Medicine

## 2017-12-28 DIAGNOSIS — I10 Essential (primary) hypertension: Secondary | ICD-10-CM

## 2017-12-28 MED ORDER — AMLODIPINE BESYLATE 10 MG PO TABS: 10.0000 mg | ORAL_TABLET | Freq: Every day | ORAL | 1 refills | Status: DC

## 2017-12-28 NOTE — Telephone Encounter (Signed)
Last office visit 07/24/17 PCP: Dr. Sharlet [place]ina Pharmacy:  [facility] [address]   Medication refill done for Amlodipine, per protocol.  Pt. Made aware.

## 2017-12-28 NOTE — Telephone Encounter (Signed)
Copied from [place] [id number]. Topic: Quick Communication - Rx Refill/Question >> Dec 28, 2017 12:12 PM Wynetta Emery, [place] C wrote: Medication: amLODipine (NORVASC) 10 MG tablet   Has the patient contacted their pharmacy? Yes - this is a new Rx for pharmacy    (Agent: If no, request that the patient contact the pharmacy for the refill.)   Preferred Pharmacy (with phone number or street name): [facility] [address].   Agent: Please be advised that RX refills may take up to 3 business days. We ask that you follow-up with your pharmacy.

## 2018-01-22 ENCOUNTER — Encounter: Payer: Self-pay | Admitting: Internal Medicine

## 2018-01-22 ENCOUNTER — Ambulatory Visit (INDEPENDENT_AMBULATORY_CARE_PROVIDER_SITE_OTHER): Payer: Medicare HMO | Admitting: Internal Medicine

## 2018-01-22 ENCOUNTER — Other Ambulatory Visit (INDEPENDENT_AMBULATORY_CARE_PROVIDER_SITE_OTHER): Payer: Medicare HMO

## 2018-01-22 VITALS — BP 120/70 | HR 63 | Temp 97.6°F | Ht 66.0 in | Wt 174.0 lb

## 2018-01-22 DIAGNOSIS — E538 Deficiency of other specified B group vitamins: Secondary | ICD-10-CM | POA: Diagnosis not present

## 2018-01-22 DIAGNOSIS — E559 Vitamin D deficiency, unspecified: Secondary | ICD-10-CM | POA: Diagnosis not present

## 2018-01-22 DIAGNOSIS — D229 Melanocytic nevi, unspecified: Secondary | ICD-10-CM

## 2018-01-22 DIAGNOSIS — E049 Nontoxic goiter, unspecified: Secondary | ICD-10-CM | POA: Diagnosis not present

## 2018-01-22 LAB — HEMOGLOBIN A1C: Hgb A1c MFr Bld: 6.3 % (ref 4.6–6.5)

## 2018-01-22 LAB — TSH: TSH: 1.31 u[IU]/mL (ref 0.35–4.50)

## 2018-01-22 LAB — VITAMIN B12: VITAMIN B 12: 221 pg/mL (ref 211–911)

## 2018-01-22 LAB — VITAMIN D 25 HYDROXY (VIT D DEFICIENCY, FRACTURES): VITD: 23.87 ng/mL — ABNORMAL LOW (ref 30.00–100.00)

## 2018-01-22 NOTE — Assessment & Plan Note (Signed)
Referral to dermatology done today for mole which likely needs removal.

## 2018-01-22 NOTE — Progress Notes (Signed)
   Subjective:    Patient ID: Jennifer Huang, female    DOB: 06-Aug-1951, 67 y.o.   MRN: [id number]  HPI The patient is a 67 YO female coming in for follow up of her mole (on the side of her face, growing, dark in color, denies pain or itching, tries to use hat to shade her face when outside for long times), and her goiter (denies growth, denies heat or cold intolerance, denies problems with swallowing). No new concerns.   Review of Systems  Constitutional: Negative.   HENT: Negative.   Eyes: Negative.   Respiratory: Negative for cough, chest tightness and shortness of breath.   Cardiovascular: Negative for chest pain, palpitations and leg swelling.  Gastrointestinal: Negative for abdominal distention, abdominal pain, constipation, diarrhea, nausea and vomiting.  Musculoskeletal: Negative.   Skin: Positive for color change.  Neurological: Negative.   Psychiatric/Behavioral: Negative.       Objective:   Physical Exam  Constitutional: She is oriented to person, place, and time. She appears well-developed and well-nourished.  HENT:  Head: Normocephalic and atraumatic.  Eyes: EOM are normal.  Neck: Normal range of motion.  Cardiovascular: Normal rate and regular rhythm.  Pulmonary/Chest: Effort normal and breath sounds normal. No respiratory distress. She has no wheezes. She has no rales.  Abdominal: Soft. Bowel sounds are normal. She exhibits no distension. There is no tenderness. There is no rebound.  Musculoskeletal: She exhibits no edema.  Neurological: She is alert and oriented to person, place, and time. Coordination normal.  Skin: Skin is warm and dry.  Mole on left side of face, dark in appearance and homogenous, stable in size from prior at about 3 mm circular  Psychiatric: She has a normal mood and affect.   Vitals:   01/22/18 1315  BP: 120/70  Pulse: 63  Temp: 97.6 F (36.4 C)  TempSrc: Oral  SpO2: 98%  Weight: 174 lb (78.9 kg)  Height: 5\' 6"  (1.676 m)        Assessment & Plan:

## 2018-01-22 NOTE — Patient Instructions (Signed)
We will have you see the dermatologist.   We will check the labs today.

## 2018-01-22 NOTE — Assessment & Plan Note (Signed)
Checking TSH and adjust as needed. No compressive symptoms and no growth.

## 2018-01-24 ENCOUNTER — Ambulatory Visit: Payer: Self-pay

## 2018-01-24 NOTE — Telephone Encounter (Signed)
Called patient back and let her know that 167mcg of B12 daily was safe and okay to take

## 2018-01-24 NOTE — Telephone Encounter (Signed)
Pt. Is asking which dose of Vitamin B12 is recommended?  Advised will send a message to her provider.  Agrees w/ plan.   Reason for Disposition . Caller has NON-URGENT medication question about med that PCP prescribed and triager unable to answer question  Answer Assessment - Initial Assessment Questions 1. SYMPTOMS: "Do you have any symptoms?"     Denies symptoms; asking about which dose of Vitamin B12, over the counter is recommended?  2. SEVERITY: If symptoms are present, ask "Are they mild, moderate or severe?"     n/a  Protocols used: MEDICATION QUESTION CALL-A-AH

## 2018-01-31 ENCOUNTER — Encounter: Payer: Self-pay | Admitting: Internal Medicine

## 2018-02-27 DIAGNOSIS — H401121 Primary open-angle glaucoma, left eye, mild stage: Secondary | ICD-10-CM | POA: Diagnosis not present

## 2018-03-03 ENCOUNTER — Other Ambulatory Visit: Payer: Self-pay | Admitting: Internal Medicine

## 2018-03-14 DIAGNOSIS — D485 Neoplasm of uncertain behavior of skin: Secondary | ICD-10-CM | POA: Diagnosis not present

## 2018-03-14 DIAGNOSIS — W57XXXA Bitten or stung by nonvenomous insect and other nonvenomous arthropods, initial encounter: Secondary | ICD-10-CM | POA: Diagnosis not present

## 2018-03-14 DIAGNOSIS — D2221 Melanocytic nevi of right ear and external auricular canal: Secondary | ICD-10-CM | POA: Diagnosis not present

## 2018-03-14 DIAGNOSIS — D225 Melanocytic nevi of trunk: Secondary | ICD-10-CM | POA: Diagnosis not present

## 2018-03-14 DIAGNOSIS — S0096XA Insect bite (nonvenomous) of unspecified part of head, initial encounter: Secondary | ICD-10-CM | POA: Diagnosis not present

## 2018-03-14 DIAGNOSIS — L821 Other seborrheic keratosis: Secondary | ICD-10-CM | POA: Diagnosis not present

## 2018-04-01 DIAGNOSIS — H33102 Unspecified retinoschisis, left eye: Secondary | ICD-10-CM | POA: Diagnosis not present

## 2018-04-01 DIAGNOSIS — H401122 Primary open-angle glaucoma, left eye, moderate stage: Secondary | ICD-10-CM | POA: Diagnosis not present

## 2018-04-01 DIAGNOSIS — H35372 Puckering of macula, left eye: Secondary | ICD-10-CM | POA: Diagnosis not present

## 2018-04-19 ENCOUNTER — Ambulatory Visit (INDEPENDENT_AMBULATORY_CARE_PROVIDER_SITE_OTHER): Payer: Medicare HMO | Admitting: Internal Medicine

## 2018-04-19 ENCOUNTER — Encounter: Payer: Self-pay | Admitting: Internal Medicine

## 2018-04-19 DIAGNOSIS — M542 Cervicalgia: Secondary | ICD-10-CM | POA: Diagnosis not present

## 2018-04-19 MED ORDER — DOXYCYCLINE HYCLATE 100 MG PO TABS: 100.0000 mg | ORAL_TABLET | Freq: Two times a day (BID) | ORAL | 0 refills | Status: DC

## 2018-04-19 NOTE — Progress Notes (Signed)
   Subjective:    Patient ID: Jennifer Huang, female    DOB: 12-16-1950, 67 y.o.   MRN: [id number]  HPI The patient is a 67 YO female coming in for tick bite and pain. She noticed the tick on Tuesday in her hairline and thinks it was on for a day or less. She does try to use off when she goes outside and was not doing anything much outdoors. She does check for ticks when she is outside. She has another tick about a month ago that she did not notice. The dermatologist removed it at a routine visit and it had an unknown duration and she was given antibiotics then. She does have pain at the place tick was removed. It is on the back of her neck so she does not know if there is a rash. Denies fevers or chills. Has not taken anything for pain.   Review of Systems  Constitutional: Negative.   HENT: Negative.   Eyes: Negative.   Respiratory: Negative for cough, chest tightness and shortness of breath.   Cardiovascular: Negative for chest pain, palpitations and leg swelling.  Gastrointestinal: Negative for abdominal distention, abdominal pain, constipation, diarrhea, nausea and vomiting.  Musculoskeletal: Negative.   Skin: Positive for wound.  Neurological: Negative.   Psychiatric/Behavioral: Negative.       Objective:   Physical Exam  Constitutional: She is oriented to person, place, and time. She appears well-developed and well-nourished.  HENT:  Head: Normocephalic and atraumatic.  Eyes: EOM are normal.  Neck: Normal range of motion.  Cardiovascular: Normal rate and regular rhythm.  Pulmonary/Chest: Effort normal and breath sounds normal. No respiratory distress. She has no wheezes. She has no rales.  Abdominal: Soft. Bowel sounds are normal. She exhibits no distension. There is no tenderness. There is no rebound.  Musculoskeletal: She exhibits no edema.  Neurological: She is alert and oriented to person, place, and time. Coordination normal.  Skin: Skin is warm and dry. Rash noted.  Two  tick bites at the base of the neck with some surrounding erythema.   Psychiatric: She has a normal mood and affect.   Vitals:   04/19/18 0810  BP: 98/60  Pulse: (!) 59  Temp: 97.7 F (36.5 C)  TempSrc: Oral  SpO2: 97%  Weight: 177 lb (80.3 kg)  Height: 5\' 6"  (1.676 m)      Assessment & Plan:

## 2018-04-19 NOTE — Patient Instructions (Signed)
We have sent in doxycycline to take 1 pill this morning and 1 pill in the evening to prevent the tick disease.   Keep monitoring for ticks as the quicker they are taken off once found the lower the risk of any diseases.

## 2018-04-19 NOTE — Assessment & Plan Note (Signed)
Due to tick bite, given rash will rx doxycycline 2 pills for prevention.

## 2018-05-15 DIAGNOSIS — Z4421 Encounter for fitting and adjustment of artificial right eye: Secondary | ICD-10-CM | POA: Diagnosis not present

## 2018-05-15 DIAGNOSIS — H401121 Primary open-angle glaucoma, left eye, mild stage: Secondary | ICD-10-CM | POA: Diagnosis not present

## 2018-06-04 ENCOUNTER — Other Ambulatory Visit: Payer: Self-pay | Admitting: Internal Medicine

## 2018-06-20 ENCOUNTER — Other Ambulatory Visit: Payer: Self-pay | Admitting: Internal Medicine

## 2018-06-20 DIAGNOSIS — I10 Essential (primary) hypertension: Secondary | ICD-10-CM

## 2018-07-01 DIAGNOSIS — Z91030 Bee allergy status: Secondary | ICD-10-CM | POA: Diagnosis not present

## 2018-07-01 DIAGNOSIS — J300 Vasomotor rhinitis: Secondary | ICD-10-CM | POA: Diagnosis not present

## 2018-07-24 NOTE — Progress Notes (Signed)
Subjective:   Jennifer Huang is a 67 y.o. female who presents for Medicare Annual (Subsequent) preventive examination.  Review of Systems:  No ROS.  Medicare Wellness Visit. Additional risk factors are reflected in the social history.  Cardiac Risk Factors include: advanced age (>84men, >10 women);hypertension Sleep patterns: gets up 1 times nightly to void and sleeps 6-7 hours nightly.    Home Safety/Smoke Alarms: Feels safe in home. Smoke alarms in place.  Living environment; residence and Firearm Safety: split level / walkout, no firearms Lives alone needs tub bench for safety, limited support system. Seat Belt Safety/Bike Helmet: Wears seat belt.     Objective:     Vitals: BP 117/74   Pulse (!) 53   Temp 97.9 F (36.6 C)   Resp 19   Ht 5\' 6"  (1.676 m)   Wt 174 lb (78.9 kg)   SpO2 100%   BMI 28.08 kg/m   Body mass index is 28.08 kg/m.  Advanced Directives 07/25/2018 02/03/2017 10/24/2016 09/24/2015  Does Patient Have a Medical Advance Directive? No No No No  Would patient like information on creating a medical advance directive? Yes (ED - Information included in AVS) No - Patient declined - -    Tobacco Social History   Tobacco Use  Smoking Status Never Smoker  Smokeless Tobacco Never Used     Counseling given: Not Answered  Past Medical History:  Diagnosis Date  . Allergy    year around allergies  . Chronic diastolic (congestive) heart failure ([place]) 05/10/2017  . Colon polyps    anal polyp  . Gout, unspecified   . Hyperlipidemia   . Primary localized osteoarthrosis, lower leg   . Unspecified essential hypertension   . Unspecified glaucoma(365.9)    Left Eye   Past Surgical History:  Procedure Laterality Date  . COLON SURGERY     anal polyp excised gen'l anesthesia  . COLONOSCOPY  10-14-2010   jacobs-hx TA 2006  . ENUCLEATION     OD 2nd to Glaucoma  . OOPHORECTOMY     Had Blockage  . SALPINGECTOMY     Had Blockage  . TOTAL ABDOMINAL  HYSTERECTOMY  2002   Family History  Problem Relation Age of Onset  . Osteoarthritis Mother   . Hypertension Mother   . Emphysema Father   . Diabetes Father   . Hypertension Father   . Arthritis Sister        hip replacments, wrist surgery  . Hyperlipidemia Sister   . Hypertension Sister   . Diabetes Brother   . Hypertension Brother   . Hyperlipidemia Brother   . Cancer - Other Brother   . Heart disease Brother   . Hypertension Brother   . Hyperlipidemia Brother   . Diabetes Brother   . Colon polyps Brother   . Diabetes Brother   . Heart disease Brother   . Hypertension Brother   . Hyperlipidemia Brother   . Colon polyps Brother   . Esophageal cancer Neg Hx   . Rectal cancer Neg Hx   . Stomach cancer Neg Hx    Social History   Socioeconomic History  . Marital status: Widowed    Spouse name: Not on file  . Number of children: 0  . Years of education: Not on file  . Highest education level: Not on file  Occupational History  . 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 Financial resource strain: Not  hard at all  . Food insecurity:    Worry: Never true    Inability: Never true  . Transportation needs:    Medical: No    Non-medical: No  Tobacco Use  . Smoking status: Never Smoker  . Smokeless tobacco: Never Used  Substance and Sexual Activity  . Alcohol use: No    Alcohol/week: 0.0 standard drinks  . Drug use: No  . Sexual activity: Never    Partners: Male  Lifestyle  . Physical activity:    Days per week: 3 days    Minutes per session: 40 min  . Stress: Not at all  Relationships  . Social connections:    Talks on phone: More than three times a week    Gets together: More than three times a week    Attends religious service: More than 4 times per year    Active member of club or organization: Yes    Attends meetings of clubs or organizations: More than 4 times per year    Relationship status:  Widowed  Other Topics Concern  . Not on file  Social History Narrative   HSG, @ year college. Married '73. No children. On disability - orthopedic: knee, back, shortened leg.. SO - self-employed.      Dec '12 - many stressors: husband with cancer, brother with pancreatic cancer, housing and financial issues.              Outpatient Encounter Medications as of 07/25/2018  Medication Sig  . amLODipine (NORVASC) 10 MG tablet TAKE 1 TABLET BY MOUTH EVERY DAY  . Azelastine HCl 0.15 % SOLN Place 2 sprays into the nose every 12 (twelve) hours as needed.  . brimonidine (ALPHAGAN P) 0.1 % SOLN 3 (three) times daily.    . dorzolamide-timolol (COSOPT) 22.3-6.8 MG/ML ophthalmic solution 1 drop 2 (two) times daily.    . furosemide (LASIX) 20 MG tablet TAKE 1 TABLET BY MOUTH EVERY DAY IF NEEDED  . gabapentin (NEURONTIN) 100 MG capsule Take 1 capsule (100 mg total) by mouth 3 (three) times daily.  Marland Kitchen lidocaine (LIDODERM) 5 % Place 1 patch onto the skin daily. Remove & Discard patch within 12 hours or as directed by MD  . meloxicam (MOBIC) 15 MG tablet Take 1 tablet (15 mg total) by mouth daily as needed for pain.  . methocarbamol (ROBAXIN) 500 MG tablet Take 0.5-1 tablets (250-500 mg total) by mouth every 8 (eight) hours as needed for muscle spasms.  . montelukast (SINGULAIR) 10 MG tablet Take 10 mg by mouth every evening.  Marland Kitchen RHOPRESSA 0.02 % SOLN   . tapentadol HCl (NUCYNTA) 75 MG tablet Take 1 tablet (75 mg total) by mouth daily as needed.  Marland Kitchen VYZULTA 0.024 % SOLN instill 1 drop at bedtime into left eye  . [DISCONTINUED] doxycycline (VIBRA-TABS) 100 MG tablet Take 1 tablet (100 mg total) by mouth 2 (two) times daily. (Patient not taking: Reported on 07/25/2018)   No facility-administered encounter medications on file as of 07/25/2018.     Activities of Daily Living In your present state of health, do you have any difficulty performing the following activities: 07/25/2018  Hearing? N  Vision? N    Difficulty concentrating or making decisions? N  Walking or climbing stairs? N  Dressing or bathing? N  Doing errands, shopping? N  Preparing Food and eating ? N  Using the Toilet? N  In the past six months, have you accidently leaked urine? N  Do you have problems  with loss of bowel control? N  Managing your Medications? N  Managing your Finances? N  Housekeeping or managing your Housekeeping? N  Some recent data might be hidden    Patient Care Team: Hoyt Koch, MD as PCP - General (Internal Medicine) Ubaldo Glassing, Marny Lowenstein, MD (Inactive) (Obstetrics and Gynecology) Marily Memos, MD (Orthopedic Surgery) Marylynn Pearson, MD (Ophthalmology) Mosetta Anis, MD (Allergy) Ruthann Cancer (Inactive)    Assessment:   This is a routine wellness examination for [facility]. Physical assessment deferred to PCP.   Exercise Activities and Dietary recommendations Current Exercise Habits: Home exercise routine, Type of exercise: walking;stretching, Time (Minutes): 40, Frequency (Times/Week): 3, Weekly Exercise (Minutes/Week): 120, Intensity: Mild, Exercise limited by: orthopedic condition(s)  Diet (meal preparation, eat out, water intake, caffeinated beverages, dairy products, fruits and vegetables): in general, a "healthy" diet  , well balanced. eats a variety of fruits and vegetables daily, limits salt, fat/cholesterol, sugar,carbohydrates,caffeine, drinks 6-8 glasses of water daily.  Goals    . Patient Stated     I want to expand my mind and spirit by going to places in the surrounding area to learn and enjoy life.       Fall Risk Fall Risk  07/25/2018 07/24/2017 04/28/2016  Falls in the past year? Yes Yes Yes  Number falls in past yr: 1 1 1   Injury with Fall? Yes Yes Yes  Risk for fall due to : Impaired mobility - -  Follow up Falls prevention discussed - -    Depression Screen PHQ 2/9 Scores 07/25/2018 07/24/2017 04/28/2016  PHQ - 2 Score 1 0 0  PHQ- 9 Score 3 - -      Cognitive Function MMSE - Mini Mental State Exam 07/25/2018  Orientation to time 5  Orientation to Place 5  Registration 3  Attention/ Calculation 5  Recall 1  Language- name 2 objects 2  Language- repeat 1  Language- follow 3 step command 3  Language- read & follow direction 1  Write a sentence 1  Copy design 1  Total score 28        Immunization History  Administered Date(s) Administered  . Influenza Whole 11/09/2009  . Influenza, High Dose Seasonal PF 08/31/2016, 07/24/2017  . Influenza, Seasonal, Injecte, Preservative Fre 10/17/2012  . Influenza,inj,Quad PF,6+ Mos 07/10/2014  . Pneumococcal Conjugate-13 07/24/2017  . Pneumococcal Polysaccharide-23 03/28/2016  . Tetanus 10/17/2012   Screening Tests Health Maintenance  Topic Date Due  . INFLUENZA VACCINE  05/23/2018  . MAMMOGRAM  10/12/2019  . COLONOSCOPY  09/30/2020  . TETANUS/TDAP  10/17/2022  . DEXA SCAN  Completed  . Hepatitis C Screening  Completed  . PNA vac Low Risk Adult  Completed      Plan:      Solicitor and social resources provided.  Continue doing brain stimulating activities (puzzles, reading, adult coloring books, staying active) to keep memory sharp.   Continue to eat heart healthy diet (full of fruits, vegetables, whole grains, lean protein, water--limit salt, fat, and sugar intake) and increase physical activity as tolerated.  I have personally reviewed and noted the following in the patient's chart:   . Medical and social history . Use of alcohol, tobacco or illicit drugs  . Current medications and supplements . Functional ability and status . Nutritional status . Physical activity . Advanced directives . List of other physicians . Vitals . Screenings to include cognitive, depression, and falls . Referrals and appointments  In addition, I have reviewed and discussed with patient  certain preventive protocols, quality metrics, and best practice recommendations. A written  personalized care plan for preventive services as well as general preventive health recommendations were provided to patient.     Michiel Cowboy, RN  07/25/2018

## 2018-07-25 ENCOUNTER — Other Ambulatory Visit (INDEPENDENT_AMBULATORY_CARE_PROVIDER_SITE_OTHER): Payer: Medicare HMO

## 2018-07-25 ENCOUNTER — Encounter: Payer: Self-pay | Admitting: Internal Medicine

## 2018-07-25 ENCOUNTER — Ambulatory Visit (INDEPENDENT_AMBULATORY_CARE_PROVIDER_SITE_OTHER): Payer: Medicare HMO | Admitting: *Deleted

## 2018-07-25 ENCOUNTER — Ambulatory Visit (INDEPENDENT_AMBULATORY_CARE_PROVIDER_SITE_OTHER): Payer: Medicare HMO | Admitting: Internal Medicine

## 2018-07-25 VITALS — BP 117/74 | HR 53 | Temp 97.9°F | Ht 66.0 in | Wt 174.0 lb

## 2018-07-25 VITALS — BP 117/74 | HR 53 | Temp 97.9°F | Resp 19 | Ht 66.0 in | Wt 174.0 lb

## 2018-07-25 DIAGNOSIS — Z0000 Encounter for general adult medical examination without abnormal findings: Secondary | ICD-10-CM

## 2018-07-25 DIAGNOSIS — Z23 Encounter for immunization: Secondary | ICD-10-CM | POA: Diagnosis not present

## 2018-07-25 DIAGNOSIS — I10 Essential (primary) hypertension: Secondary | ICD-10-CM

## 2018-07-25 DIAGNOSIS — Z9181 History of falling: Secondary | ICD-10-CM

## 2018-07-25 DIAGNOSIS — E7800 Pure hypercholesterolemia, unspecified: Secondary | ICD-10-CM | POA: Diagnosis not present

## 2018-07-25 LAB — LIPID PANEL
CHOL/HDL RATIO: 3
Cholesterol: 223 mg/dL — ABNORMAL HIGH (ref 0–200)
HDL: 66.7 mg/dL (ref >39.00)
LDL CALC: 139 mg/dL — AB (ref 0–99)
NonHDL: 156.43
Triglycerides: 87 mg/dL (ref 0.0–149.0)
VLDL: 17.4 mg/dL (ref 0.0–40.0)

## 2018-07-25 LAB — CBC
HCT: 38.1 % (ref 36.0–46.0)
Hemoglobin: 12.6 g/dL (ref 12.0–15.0)
MCHC: 33 g/dL (ref 30.0–36.0)
MCV: 88.5 fl (ref 78.0–100.0)
Platelets: 283 10*3/uL (ref 150.0–400.0)
RBC: 4.31 Mil/uL (ref 3.87–5.11)
RDW: 13.4 % (ref 11.5–15.5)
WBC: 7.3 10*3/uL (ref 4.0–10.5)

## 2018-07-25 LAB — COMPREHENSIVE METABOLIC PANEL
ALT: 13 U/L (ref 0–35)
AST: 16 U/L (ref 0–37)
Albumin: 4.5 g/dL (ref 3.5–5.2)
Alkaline Phosphatase: 60 U/L (ref 39–117)
BILIRUBIN TOTAL: 0.8 mg/dL (ref 0.2–1.2)
BUN: 18 mg/dL (ref 6–23)
CO2: 32 meq/L (ref 19–32)
Calcium: 9.9 mg/dL (ref 8.4–10.5)
Chloride: 101 mEq/L (ref 96–112)
Creatinine, Ser: 0.77 mg/dL (ref 0.40–1.20)
GFR: 95.99 mL/min (ref >60.00)
GLUCOSE: 131 mg/dL — AB (ref 70–99)
Potassium: 4.1 mEq/L (ref 3.5–5.1)
SODIUM: 138 meq/L (ref 135–145)
Total Protein: 7.4 g/dL (ref 6.0–8.3)

## 2018-07-25 LAB — HEMOGLOBIN A1C: Hgb A1c MFr Bld: 6.2 % (ref 4.6–6.5)

## 2018-07-25 MED ORDER — AMLODIPINE BESYLATE 10 MG PO TABS: 10.0000 mg | ORAL_TABLET | Freq: Every day | ORAL | 3 refills | Status: DC

## 2018-07-25 MED ORDER — METHOCARBAMOL 500 MG PO TABS: 250.0000 mg | ORAL_TABLET | Freq: Three times a day (TID) | ORAL | 0 refills | Status: DC | PRN

## 2018-07-25 MED ORDER — FUROSEMIDE 20 MG PO TABS: ORAL_TABLET | ORAL | 3 refills | Status: DC

## 2018-07-25 NOTE — Progress Notes (Signed)
   Subjective:    Patient ID: Jennifer Huang, female    DOB: 03-30-1951, 67 y.o.   MRN: [id number]  HPI The patient is a 67 YO female coming in for physical.   PMH, [facility], social history reviewed and updated.   Review of Systems  Constitutional: Negative.   HENT: Negative.   Eyes: Negative.   Respiratory: Negative for cough, chest tightness and shortness of breath.   Cardiovascular: Negative for chest pain, palpitations and leg swelling.  Gastrointestinal: Negative for abdominal distention, abdominal pain, constipation, diarrhea, nausea and vomiting.  Musculoskeletal: Positive for arthralgias and myalgias.  Skin: Negative.   Neurological: Negative.   Psychiatric/Behavioral: Negative.       Objective:   Physical Exam  Constitutional: She is oriented to person, place, and time. She appears well-developed and well-nourished.  HENT:  Head: Normocephalic and atraumatic.  Eyes: EOM are normal.  Neck: Normal range of motion.  Cardiovascular: Normal rate and regular rhythm.  Pulmonary/Chest: Effort normal and breath sounds normal. No respiratory distress. She has no wheezes. She has no rales.  Abdominal: Soft. Bowel sounds are normal. She exhibits no distension. There is no tenderness. There is no rebound.  Musculoskeletal: She exhibits tenderness. She exhibits no edema.  Neurological: She is alert and oriented to person, place, and time. Coordination normal.  Skin: Skin is warm and dry.  Psychiatric: She has a normal mood and affect.   Vitals:   07/25/18 0946  BP: 117/74  Pulse: (!) 53  Temp: 97.9 F (36.6 C)  SpO2: 100%  Weight: 174 lb (78.9 kg)  Height: 5\' 6"  (1.676 m)      Assessment & Plan:

## 2018-07-25 NOTE — Patient Instructions (Signed)
You can try the muscle relaxer for the leg.

## 2018-07-25 NOTE — Patient Instructions (Addendum)
If you or someone you know has experienced the death of a loved one, the support of others can play an invaluable role in the healing process. [facility] offers grief and loss services for anyone in the community. Contact us today [phone]  Continue doing brain stimulating activities (puzzles, reading, adult coloring books, staying active) to keep memory sharp.   Continue to eat heart healthy diet (full of fruits, vegetables, whole grains, lean protein, water--limit salt, fat, and sugar intake) and increase physical activity as tolerated.   Jennifer Huang , Thank you for taking time to come for your Medicare Wellness Visit. I appreciate your ongoing commitment to your health goals. Please review the following plan we discussed and let me know if I can assist you in the future.   These are the goals we discussed: Goals    . Patient Stated     I want to expand my mind and spirit by going to places in the surrounding area to learn and enjoy life.       This is a list of the screening recommended for you and due dates:  Health Maintenance  Topic Date Due  . Flu Shot  05/23/2018  . Mammogram  10/12/2019  . Colon Cancer Screening  09/30/2020  . Tetanus Vaccine  10/17/2022  . DEXA scan (bone density measurement)  Completed  .  Hepatitis C: One time screening is recommended by Center for Disease Control  (CDC) for  adults born from [birth year] through 1965.   Completed  . Pneumonia vaccines  Completed   Health Maintenance, Female Adopting a healthy lifestyle and getting preventive care can go a long way to promote health and wellness. Talk with your health care provider about what schedule of regular examinations is right for you. This is a good chance for you to check in with your provider about disease prevention and staying healthy. In between checkups, there are plenty of things you can do on your own. Experts have done a lot of research about which lifestyle changes and  preventive measures are most likely to keep you healthy. Ask your health care provider for more information. Weight and diet Eat a healthy diet  Be sure to include plenty of vegetables, fruits, low-fat dairy products, and lean protein.  Do not eat a lot of foods high in solid fats, added sugars, or salt.  Get regular exercise. This is one of the most important things you can do for your health. ? Most adults should exercise for at least 150 minutes each week. The exercise should increase your heart rate and make you sweat (moderate-intensity exercise). ? Most adults should also do strengthening exercises at least twice a week. This is in addition to the moderate-intensity exercise.  Maintain a healthy weight  Body mass index (BMI) is a measurement that can be used to identify possible weight problems. It estimates body fat based on height and weight. Your health care provider can help determine your BMI and help you achieve or maintain a healthy weight.  For females 31 years of age and older: ? A BMI below 18.5 is considered underweight. ? A BMI of 18.5 to 24.9 is normal. ? A BMI of 25 to 29.9 is considered overweight. ? A BMI of 30 and above is considered obese.  Watch levels of cholesterol and blood lipids  You should start having your blood tested for lipids and cholesterol at 67 years of age, then have this test every 5 years.  You may need to have your cholesterol levels checked more often if: ? Your lipid or cholesterol levels are high. ? You are older than 67 years of age. ? You are at high risk for heart disease.  Cancer screening Lung Cancer  Lung cancer screening is recommended for adults 33-73 years old who are at high risk for lung cancer because of a history of smoking.  A yearly low-dose CT scan of the lungs is recommended for people who: ? Currently smoke. ? Have quit within the past 15 years. ? Have at least a 30-pack-year history of smoking. A pack year is  smoking an average of one pack of cigarettes a day for 1 year.  Yearly screening should continue until it has been 15 years since you quit.  Yearly screening should stop if you develop a health problem that would prevent you from having lung cancer treatment.  Breast Cancer  Practice breast self-awareness. This means understanding how your breasts normally appear and feel.  It also means doing regular breast self-exams. Let your health care provider know about any changes, no matter how small.  If you are in your 20s or 30s, you should have a clinical breast exam (CBE) by a health care provider every 1-3 years as part of a regular health exam.  If you are 3 or older, have a CBE every year. Also consider having a breast X-ray (mammogram) every year.  If you have a family history of breast cancer, talk to your health care provider about genetic screening.  If you are at high risk for breast cancer, talk to your health care provider about having an MRI and a mammogram every year.  Breast cancer gene (BRCA) assessment is recommended for women who have family members with BRCA-related cancers. BRCA-related cancers include: ? Breast. ? Ovarian. ? Tubal. ? Peritoneal cancers.  Results of the assessment will determine the need for genetic counseling and BRCA1 and BRCA2 testing.  Cervical Cancer Your health care provider may recommend that you be screened regularly for cancer of the pelvic organs (ovaries, uterus, and vagina). This screening involves a pelvic examination, including checking for microscopic changes to the surface of your cervix (Pap test). You may be encouraged to have this screening done every 3 years, beginning at age 35.  For women ages 28-65, health care providers may recommend pelvic exams and Pap testing every 3 years, or they may recommend the Pap and pelvic exam, combined with testing for human papilloma virus (HPV), every 5 years. Some types of HPV increase your risk  of cervical cancer. Testing for HPV may also be done on women of any age with unclear Pap test results.  Other health care providers may not recommend any screening for nonpregnant women who are considered low risk for pelvic cancer and who do not have symptoms. Ask your health care provider if a screening pelvic exam is right for you.  If you have had past treatment for cervical cancer or a condition that could lead to cancer, you need Pap tests and screening for cancer for at least 20 years after your treatment. If Pap tests have been discontinued, your risk factors (such as having a new sexual partner) need to be reassessed to determine if screening should resume. Some women have medical problems that increase the chance of getting cervical cancer. In these cases, your health care provider may recommend more frequent screening and Pap tests.  Colorectal Cancer  This type of cancer can be detected  and often prevented.  Routine colorectal cancer screening usually begins at 67 years of age and continues through 67 years of age.  Your health care provider may recommend screening at an earlier age if you have risk factors for colon cancer.  Your health care provider may also recommend using home test kits to check for hidden blood in the stool.  A small camera at the end of a tube can be used to examine your colon directly (sigmoidoscopy or colonoscopy). This is done to check for the earliest forms of colorectal cancer.  Routine screening usually begins at age 57.  Direct examination of the colon should be repeated every 5-10 years through 67 years of age. However, you may need to be screened more often if early forms of precancerous polyps or small growths are found.  Skin Cancer  Check your skin from head to toe regularly.  Tell your health care provider about any new moles or changes in moles, especially if there is a change in a mole's shape or color.  Also tell your health care  provider if you have a mole that is larger than the size of a pencil eraser.  Always use sunscreen. Apply sunscreen liberally and repeatedly throughout the day.  Protect yourself by wearing long sleeves, pants, a wide-brimmed hat, and sunglasses whenever you are outside.  Heart disease, diabetes, and high blood pressure  High blood pressure causes heart disease and increases the risk of stroke. High blood pressure is more likely to develop in: ? People who have blood pressure in the high end of the normal range (130-139/85-89 mm Hg). ? People who are overweight or obese. ? People who are African American.  If you are 68-23 years of age, have your blood pressure checked every 3-5 years. If you are 88 years of age or older, have your blood pressure checked every year. You should have your blood pressure measured twice-once when you are at a hospital or clinic, and once when you are not at a hospital or clinic. Record the average of the two measurements. To check your blood pressure when you are not at a hospital or clinic, you can use: ? An automated blood pressure machine at a pharmacy. ? A home blood pressure monitor.  If you are between 33 years and 83 years old, ask your health care provider if you should take aspirin to prevent strokes.  Have regular diabetes screenings. This involves taking a blood sample to check your fasting blood sugar level. ? If you are at a normal weight and have a low risk for diabetes, have this test once every three years after 67 years of age. ? If you are overweight and have a high risk for diabetes, consider being tested at a younger age or more often. Preventing infection Hepatitis B  If you have a higher risk for hepatitis B, you should be screened for this virus. You are considered at high risk for hepatitis B if: ? You were born in a country where hepatitis B is common. Ask your health care provider which countries are considered high risk. ? Your  parents were born in a high-risk country, and you have not been immunized against hepatitis B (hepatitis B vaccine). ? You have HIV or AIDS. ? You use needles to inject street drugs. ? You live with someone who has hepatitis B. ? You have had sex with someone who has hepatitis B. ? You get hemodialysis treatment. ? You take certain medicines for  conditions, including cancer, organ transplantation, and autoimmune conditions.  Hepatitis C  Blood testing is recommended for: ? Everyone born from [birth year] through 1965. ? Anyone with known risk factors for hepatitis C.  Sexually transmitted infections (STIs)  You should be screened for sexually transmitted infections (STIs) including gonorrhea and chlamydia if: ? You are sexually active and are younger than 67 years of age. ? You are older than 67 years of age and your health care provider tells you that you are at risk for this type of infection. ? Your sexual activity has changed since you were last screened and you are at an increased risk for chlamydia or gonorrhea. Ask your health care provider if you are at risk.  If you do not have HIV, but are at risk, it may be recommended that you take a prescription medicine daily to prevent HIV infection. This is called pre-exposure prophylaxis (PrEP). You are considered at risk if: ? You are sexually active and do not regularly use condoms or know the HIV status of your partner(s). ? You take drugs by injection. ? You are sexually active with a partner who has HIV.  Talk with your health care provider about whether you are at high risk of being infected with HIV. If you choose to begin PrEP, you should first be tested for HIV. You should then be tested every 3 months for as long as you are taking PrEP. Pregnancy  If you are premenopausal and you may become pregnant, ask your health care provider about preconception counseling.  If you may become pregnant, take 400 to 800 micrograms (mcg) of folic  acid every day.  If you want to prevent pregnancy, talk to your health care provider about birth control (contraception). Osteoporosis and menopause  Osteoporosis is a disease in which the bones lose minerals and strength with aging. This can result in serious bone fractures. Your risk for osteoporosis can be identified using a bone density scan.  If you are 54 years of age or older, or if you are at risk for osteoporosis and fractures, ask your health care provider if you should be screened.  Ask your health care provider whether you should take a calcium or vitamin D supplement to lower your risk for osteoporosis.  Menopause may have certain physical symptoms and risks.  Hormone replacement therapy may reduce some of these symptoms and risks. Talk to your health care provider about whether hormone replacement therapy is right for you. Follow these instructions at home:  Schedule regular health, dental, and eye exams.  Stay current with your immunizations.  Do not use any tobacco products including cigarettes, chewing tobacco, or electronic cigarettes.  If you are pregnant, do not drink alcohol.  If you are breastfeeding, limit how much and how often you drink alcohol.  Limit alcohol intake to no more than 1 drink per day for nonpregnant women. One drink equals 12 ounces of beer, 5 ounces of wine, or 1 ounces of hard liquor.  Do not use street drugs.  Do not share needles.  Ask your health care provider for help if you need support or information about quitting drugs.  Tell your health care provider if you often feel depressed.  Tell your health care provider if you have ever been abused or do not feel safe at home. This information is not intended to replace advice given to you by your health care provider. Make sure you discuss any questions you have with your health care provider.  Document Released: 04/24/2011 Document Revised: 03/16/2016 Document Reviewed:  07/13/2015 Elsevier Interactive Patient Education  2018 [place].  Influenza Virus Vaccine injection What is this medicine? INFLUENZA VIRUS VACCINE (in floo EN zuh VAHY ruhs vak SEEN) helps to reduce the risk of getting influenza also known as the flu. The vaccine only helps protect you against some strains of the flu. This medicine may be used for other purposes; ask your health care provider or pharmacist if you have questions. COMMON BRAND NAME(S): Afluria, Agriflu, Alfuria, FLUAD, Fluarix, Fluarix Quadrivalent, Flublok, Flublok Quadrivalent, FLUCELVAX, Flulaval, Fluvirin, Fluzone, Fluzone High-Dose, Fluzone Intradermal What should I tell my health care provider before I take this medicine? They need to know if you have any of these conditions: -bleeding disorder like hemophilia -fever or infection -Guillain-Barre syndrome or other neurological problems -immune system problems -infection with the human immunodeficiency virus (HIV) or AIDS -low blood platelet counts -multiple sclerosis -an unusual or allergic reaction to influenza virus vaccine, latex, other medicines, foods, dyes, or preservatives. Different brands of vaccines contain different allergens. Some may contain latex or eggs. Talk to your doctor about your allergies to make sure that you get the right vaccine. -pregnant or trying to get pregnant -breast-feeding How should I use this medicine? This vaccine is for injection into a muscle or under the skin. It is given by a health care professional. A copy of Vaccine Information Statements will be given before each vaccination. Read this sheet carefully each time. The sheet may change frequently. Talk to your healthcare provider to see which vaccines are right for you. Some vaccines should not be used in all age groups. Overdosage: If you think you have taken too much of this medicine contact a poison control center or emergency room at once. NOTE: This medicine is only for  you. Do not share this medicine with others. What if I miss a dose? This does not apply. What may interact with this medicine? -chemotherapy or radiation therapy -medicines that lower your immune system like etanercept, anakinra, infliximab, and adalimumab -medicines that treat or prevent blood clots like warfarin -phenytoin -steroid medicines like prednisone or cortisone -theophylline -vaccines This list may not describe all possible interactions. Give your health care provider a list of all the medicines, herbs, non-prescription drugs, or dietary supplements you use. Also tell them if you smoke, drink alcohol, or use illegal drugs. Some items may interact with your medicine. What should I watch for while using this medicine? Report any side effects that do not go away within 3 days to your doctor or health care professional. Call your health care provider if any unusual symptoms occur within 6 weeks of receiving this vaccine. You may still catch the flu, but the illness is not usually as bad. You cannot get the flu from the vaccine. The vaccine will not protect against colds or other illnesses that may cause fever. The vaccine is needed every year. What side effects may I notice from receiving this medicine? Side effects that you should report to your doctor or health care professional as soon as possible: -allergic reactions like skin rash, itching or hives, swelling of the face, lips, or tongue Side effects that usually do not require medical attention (report to your doctor or health care professional if they continue or are bothersome): -fever -headache -muscle aches and pains -pain, tenderness, redness, or swelling at the injection site -tiredness This list may not describe all possible side effects. Call your doctor for medical advice about side  effects. You may report side effects to FDA at 1-800-FDA-1088. Where should I keep my medicine? The vaccine will be given by a health care  professional in a clinic, pharmacy, doctor's office, or other health care setting. You will not be given vaccine doses to store at home. NOTE: This sheet is a summary. It may not cover all possible information. If you have questions about this medicine, talk to your doctor, pharmacist, or health care provider.  2018 Elsevier/Gold Standard (2015-04-30 10:07:28)

## 2018-07-25 NOTE — Progress Notes (Signed)
Medical screening examination/treatment/procedure(s) were performed by non-physician practitioner and as supervising physician I was immediately available for consultation/collaboration. I agree with above. Elizabeth A Crawford, MD 

## 2018-07-26 NOTE — Assessment & Plan Note (Signed)
Flu shot up to date. Pneumonia up to date. Shingrix counseled. Tetanus up to date. Colonoscopy up to date. Mammogram up to date, pap smear aged out and dexa up to date. Counseled about sun safety and mole surveillance. Counseled about the dangers of distracted driving. Given 10 year screening recommendations.  

## 2018-07-26 NOTE — Assessment & Plan Note (Signed)
BP at goal on amlodipine and lasix and checking CMP and adjust as needed.

## 2018-07-26 NOTE — Assessment & Plan Note (Signed)
Checking lipid panel. Adjust as needed not on meds.

## 2018-09-16 DIAGNOSIS — H401122 Primary open-angle glaucoma, left eye, moderate stage: Secondary | ICD-10-CM | POA: Diagnosis not present

## 2018-09-26 DIAGNOSIS — M1711 Unilateral primary osteoarthritis, right knee: Secondary | ICD-10-CM | POA: Diagnosis not present

## 2018-09-26 DIAGNOSIS — M170 Bilateral primary osteoarthritis of knee: Secondary | ICD-10-CM | POA: Diagnosis not present

## 2018-09-26 DIAGNOSIS — M25461 Effusion, right knee: Secondary | ICD-10-CM | POA: Diagnosis not present

## 2018-09-26 DIAGNOSIS — M25462 Effusion, left knee: Secondary | ICD-10-CM | POA: Diagnosis not present

## 2018-09-26 DIAGNOSIS — M1712 Unilateral primary osteoarthritis, left knee: Secondary | ICD-10-CM | POA: Diagnosis not present

## 2018-10-02 DIAGNOSIS — Z803 Family history of malignant neoplasm of breast: Secondary | ICD-10-CM | POA: Diagnosis not present

## 2018-10-02 DIAGNOSIS — Z1231 Encounter for screening mammogram for malignant neoplasm of breast: Secondary | ICD-10-CM | POA: Diagnosis not present

## 2018-10-02 LAB — HM MAMMOGRAPHY

## 2018-11-13 ENCOUNTER — Encounter: Payer: Self-pay | Admitting: Internal Medicine

## 2018-11-13 NOTE — Progress Notes (Signed)
Abstracted and sent to scan  

## 2018-12-20 ENCOUNTER — Ambulatory Visit (INDEPENDENT_AMBULATORY_CARE_PROVIDER_SITE_OTHER): Payer: Medicare Other | Admitting: Family Medicine

## 2018-12-20 ENCOUNTER — Encounter: Payer: Self-pay | Admitting: Family Medicine

## 2018-12-20 VITALS — BP 130/80 | HR 66 | Resp 16 | Ht 66.0 in | Wt 175.0 lb

## 2018-12-20 DIAGNOSIS — M25572 Pain in left ankle and joints of left foot: Secondary | ICD-10-CM | POA: Diagnosis not present

## 2018-12-20 MED ORDER — NAPROXEN 500 MG PO TABS: 500.0000 mg | ORAL_TABLET | Freq: Two times a day (BID) | ORAL | 0 refills | Status: DC | PRN

## 2018-12-20 NOTE — Assessment & Plan Note (Signed)
Pain today seems different than previous evaluation.  Has an effusion on exam which could be associated with gout or degenerative changes.  No crystals were seen on today.  X-ray from a few years ago did not demonstrate any significant degenerative changes. -Provide naproxen.  Has a history of glaucoma so has to be careful with steroids. -Counseled on home exercise therapy and supportive care. -If no improvement will consider updated imaging and injection.

## 2018-12-20 NOTE — Patient Instructions (Signed)
Good to see you  Please try the stretches and exercises  Please take the naproxen for 7 days straight and then as needed  Please try wearing well supported shoes  Please see me back in 3-4 weeks if no improvement.

## 2018-12-20 NOTE — Progress Notes (Signed)
Jennifer Huang - 68 y.o. female MRN [id number]  Date of birth: [birth year]  SUBJECTIVE:  Including CC & ROS.  No chief complaint on file.   Jennifer Huang is a 68 y.o. female that is presenting with left ankle pain.  The pain is occurring on the dorsal compartment and lateral anterior aspect of the ankle.  She feels the pain when she is in plantarflexion.  Denies any inciting event.  No redness.  Pain seems to be ongoing for the past 2 weeks.  No improvement with home modalities.  Pain is moderate to severe.  It is intermittent in nature..  Independent review of the left ankle x-ray from 05/18/2017 shows no significant degenerative changes.   Review of Systems  Constitutional: Negative for fever.  HENT: Negative for congestion.   Respiratory: Negative for cough.   Cardiovascular: Negative for chest pain.  Gastrointestinal: Negative for abdominal distention.  Musculoskeletal: Positive for arthralgias and back pain.  Skin: Negative for color change.  Neurological: Negative for weakness.  Hematological: Negative for adenopathy.  Psychiatric/Behavioral: Negative for agitation.    HISTORY: Past Medical, Surgical, Social, and Family History Reviewed & Updated per EMR.   Pertinent Historical Findings include:  Past Medical History:  Diagnosis Date  . Allergy    year around allergies  . Chronic diastolic (congestive) heart failure ([place]) 05/10/2017  . Colon polyps    anal polyp  . Gout, unspecified   . Hyperlipidemia   . Primary localized osteoarthrosis, lower leg   . Unspecified essential hypertension   . Unspecified glaucoma(365.9)    Left Eye    Past Surgical History:  Procedure Laterality Date  . COLON SURGERY     anal polyp excised gen'l anesthesia  . COLONOSCOPY  10-14-2010   jacobs-hx TA 2006  . ENUCLEATION     OD 2nd to Glaucoma  . OOPHORECTOMY     Had Blockage  . SALPINGECTOMY     Had Blockage  . TOTAL ABDOMINAL HYSTERECTOMY  2002    Allergies  Allergen  Reactions  . Penicillins     REACTION: rash/hives  . Sulfonamide Derivatives     REACTION: rash/hives    Family History  Problem Relation Age of Onset  . Osteoarthritis Mother   . Hypertension Mother   . Emphysema Father   . Diabetes Father   . Hypertension Father   . Arthritis Sister        hip replacments, wrist surgery  . Hyperlipidemia Sister   . Hypertension Sister   . Diabetes Brother   . Hypertension Brother   . Hyperlipidemia Brother   . Cancer - Other Brother   . Heart disease Brother   . Hypertension Brother   . Hyperlipidemia Brother   . Diabetes Brother   . Colon polyps Brother   . Diabetes Brother   . Heart disease Brother   . Hypertension Brother   . Hyperlipidemia Brother   . Colon polyps Brother   . Esophageal cancer Neg Hx   . Rectal cancer Neg Hx   . Stomach cancer Neg Hx      Social History   Socioeconomic History  . Marital status: Widowed    Spouse name: Not on file  . Number of children: 0  . Years of education: Not on file  . Highest education level: Not on file  Occupational History  . 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 Financial resource strain: Not hard  at all  . Food insecurity:    Worry: Never true    Inability: Never true  . Transportation needs:    Medical: No    Non-medical: No  Tobacco Use  . Smoking status: Never Smoker  . Smokeless tobacco: Never Used  Substance and Sexual Activity  . Alcohol use: No    Alcohol/week: 0.0 standard drinks  . Drug use: No  . Sexual activity: Never    Partners: Male  Lifestyle  . Physical activity:    Days per week: 3 days    Minutes per session: 40 min  . Stress: Not at all  Relationships  . Social connections:    Talks on phone: More than three times a week    Gets together: More than three times a week    Attends religious service: More than 4 times per year    Active member of club or organization: Yes    Attends  meetings of clubs or organizations: More than 4 times per year    Relationship status: Widowed  . Intimate partner violence:    Fear of current or ex partner: Not on file    Emotionally abused: Not on file    Physically abused: Not on file    Forced sexual activity: Not on file  Other Topics Concern  . Not on file  Social History Narrative   HSG, @ year college. Married '73. No children. On disability - orthopedic: knee, back, shortened leg.. SO - self-employed.      Dec '12 - many stressors: husband with cancer, brother with pancreatic cancer, housing and financial issues.               PHYSICAL EXAM:  VS: BP 130/80   Pulse 66   Resp 16   Ht 5\' 6"  (1.676 m)   Wt 175 lb (79.4 kg)   SpO2 99%   BMI 28.25 kg/m  Physical Exam Gen: NAD, alert, cooperative with exam, well-appearing ENT: normal lips, normal nasal mucosa,  Eye: normal EOM, normal conjunctiva and lids CV:  no edema, +2 pedal pulses   Resp: no accessory muscle use, non-labored,  Skin: no rashes, no areas of induration  Neuro: normal tone, normal sensation to touch Psych:  normal insight, alert and oriented MSK:  Left ankle: No swelling or ecchymosis. Tenderness to palpation over the lateral anterior ankle joint. Normal range of motion. No tenderness to palpation over the anterior medial ankle joint. Negative anterior drawer. Neurovascular intact  Limited ultrasound: Left ankle:  Normal appearing peroneal tendons.  Normal-appearing extensor tendons. No effusion within the ankle joint. Appears to be a small effusion over the lateral talus.  Unclear if this is extension into the ankle joint or part of the subtalar joint.  Seems to be associated with degenerative changes.  Summary: Effusion to suggest degenerative changes of the ankle joint or subtalar joint.  Ultrasound and interpretation by Clearance Coots, MD          ASSESSMENT & PLAN:   Acute left ankle pain Pain today seems different than  previous evaluation.  Has an effusion on exam which could be associated with gout or degenerative changes.  No crystals were seen on today.  X-ray from a few years ago did not demonstrate any significant degenerative changes. -Provide naproxen.  Has a history of glaucoma so has to be careful with steroids. -Counseled on home exercise therapy and supportive care. -If no improvement will consider updated imaging and injection.

## 2018-12-23 ENCOUNTER — Ambulatory Visit: Payer: Medicare HMO | Admitting: Sports Medicine

## 2019-01-08 ENCOUNTER — Telehealth: Payer: Self-pay | Admitting: Internal Medicine

## 2019-01-08 DIAGNOSIS — I10 Essential (primary) hypertension: Secondary | ICD-10-CM

## 2019-01-08 MED ORDER — FUROSEMIDE 20 MG PO TABS: ORAL_TABLET | ORAL | 3 refills | Status: DC

## 2019-01-08 MED ORDER — AMLODIPINE BESYLATE 10 MG PO TABS: 10.0000 mg | ORAL_TABLET | Freq: Every day | ORAL | 3 refills | Status: DC

## 2019-01-08 NOTE — Telephone Encounter (Signed)
Copied from [place] [id number]. Topic: Quick Communication - Rx Refill/Question >> Jan 08, 2019  2:54 PM Sheppard Coil, [employer] L wrote: Medication:  furosemide (LASIX) 20 MG tablet amLODipine (NORVASC) 10 MG tablet  Has the patient contacted their pharmacy? Yes - needs switched to [facility] for insurance purposes (Agent: If no, request that the patient contact the pharmacy for the refill.) (Agent: If yes, when and what did the pharmacy advise?)  Preferred Pharmacy (with phone number or street name): [facility], [place] [phone] (Phone) [phone] (Fax)  Agent: Please be advised that RX refills may take up to 3 business days. We ask that you follow-up with your pharmacy.

## 2019-01-08 NOTE — Telephone Encounter (Signed)
[facility] 

## 2019-04-30 ENCOUNTER — Other Ambulatory Visit: Payer: Self-pay | Admitting: *Deleted

## 2019-04-30 ENCOUNTER — Encounter: Payer: Self-pay | Admitting: Family Medicine

## 2019-04-30 ENCOUNTER — Other Ambulatory Visit: Payer: Self-pay

## 2019-04-30 ENCOUNTER — Ambulatory Visit (INDEPENDENT_AMBULATORY_CARE_PROVIDER_SITE_OTHER): Payer: Medicare Other | Admitting: Family Medicine

## 2019-04-30 ENCOUNTER — Ambulatory Visit (INDEPENDENT_AMBULATORY_CARE_PROVIDER_SITE_OTHER)
Admission: RE | Admit: 2019-04-30 | Discharge: 2019-04-30 | Disposition: A | Discharge status: Home / Self Care | Payer: Medicare Other | Source: Ambulatory Visit | Attending: Family Medicine | Admitting: Family Medicine

## 2019-04-30 DIAGNOSIS — M25571 Pain in right ankle and joints of right foot: Secondary | ICD-10-CM | POA: Diagnosis not present

## 2019-04-30 DIAGNOSIS — M25572 Pain in left ankle and joints of left foot: Secondary | ICD-10-CM

## 2019-04-30 DIAGNOSIS — M5136 Other intervertebral disc degeneration, lumbar region: Secondary | ICD-10-CM | POA: Diagnosis not present

## 2019-04-30 DIAGNOSIS — S92155A Nondisplaced avulsion fracture (chip fracture) of left talus, initial encounter for closed fracture: Secondary | ICD-10-CM | POA: Diagnosis not present

## 2019-04-30 NOTE — Assessment & Plan Note (Signed)
Known degenerative disc disease.  Patient has had epidurals previously.  Patient is concerned that this could be the reason so she is having difficulty walking and having falls.  Will consider nerve conduction test.  Discussed over-the-counter medications first.  We will see how patient responds.

## 2019-04-30 NOTE — Assessment & Plan Note (Addendum)
Ankle sprain noted.  Patient does have swelling.  Some tenderness over the ATFL.  X-rays questionable avulsion fracture of the talar dome.  Independently visualized by me awaiting the overview from radiology..  Icing regimen, topical anti-inflammatories, work with Product/process development scientist to learn home exercises.  Discussed proper shoes with rigid sole shoe.  Follow-up again in 3 weeks patient worsening pain will need to consider the possibility of a boot.  Call if symptoms worsen

## 2019-04-30 NOTE — Patient Instructions (Addendum)
Good to see you.  Ice 20 minutes 2 times daily. Usually after activity and before bed. Exercises 3 times a week.  pennsaid pinkie amount topically 2 times daily as needed.  Vitamin D 2000IU Tart Cherry 1200mg  See me again in 3-4 weeks

## 2019-04-30 NOTE — Progress Notes (Signed)
[facility] [address], [place] [address] Phone: [phone] Subjective:   I Jennifer Huang am serving as a Education administrator for Dr. Hulan Saas.     CC: Left ankle pain and other pains.  [id number]  Jennifer Huang is a 68 y.o. female coming in with complaint of left ankle and various other pains. States she fell backwards out of a door. Great toe and left ankle are most painful.   Onset- Monday  Location -mostly left ankle but many other joints Character-dull, throbbing aching pain Aggravating factors- walking and extension of the knee  Reliving factors- Topical, elevation, warm water and epsom salt soak  Therapies tried-elevation. Severity-8 out of 10     Past Medical History:  Diagnosis Date  . Allergy    year around allergies  . Chronic diastolic (congestive) heart failure ([place]) 05/10/2017  . Colon polyps    anal polyp  . Gout, unspecified   . Hyperlipidemia   . Primary localized osteoarthrosis, lower leg   . Unspecified essential hypertension   . Unspecified glaucoma(365.9)    Left Eye   Past Surgical History:  Procedure Laterality Date  . COLON SURGERY     anal polyp excised gen'l anesthesia  . COLONOSCOPY  10-14-2010   jacobs-hx TA 2006  . ENUCLEATION     OD 2nd to Glaucoma  . OOPHORECTOMY     Had Blockage  . SALPINGECTOMY     Had Blockage  . TOTAL ABDOMINAL HYSTERECTOMY  2002   Social History   Socioeconomic History  . Marital status: Widowed    Spouse name: Not on file  . Number of children: 0  . Years of education: Not on file  . Highest education level: Not on file  Occupational History  . 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 Financial resource strain: Not hard at all  . Food insecurity    Worry: Never true    Inability: Never true  . Transportation needs    Medical: No    Non-medical: No  Tobacco Use  . Smoking status: Never Smoker  .  Smokeless tobacco: Never Used  Substance and Sexual Activity  . Alcohol use: No    Alcohol/week: 0.0 standard drinks  . Drug use: No  . Sexual activity: Never    Partners: Male  Lifestyle  . Physical activity    Days per week: 3 days    Minutes per session: 40 min  . Stress: Not at all  Relationships  . Social connections    Talks on phone: More than three times a week    Gets together: More than three times a week    Attends religious service: More than 4 times per year    Active member of club or organization: Yes    Attends meetings of clubs or organizations: More than 4 times per year    Relationship status: Widowed  Other Topics Concern  . Not on file  Social History Narrative   HSG, @ year college. Married '73. No children. On disability - orthopedic: knee, back, shortened leg.. SO - self-employed.      Dec '12 - many stressors: husband with cancer, brother with pancreatic cancer, housing and financial issues.             Allergies  Allergen Reactions  . Penicillins     REACTION: rash/hives  . Sulfonamide Derivatives     REACTION:  rash/hives   Family History  Problem Relation Age of Onset  . Osteoarthritis Mother   . Hypertension Mother   . Emphysema Father   . Diabetes Father   . Hypertension Father   . Arthritis Sister        hip replacments, wrist surgery  . Hyperlipidemia Sister   . Hypertension Sister   . Diabetes Brother   . Hypertension Brother   . Hyperlipidemia Brother   . Cancer - Other Brother   . Heart disease Brother   . Hypertension Brother   . Hyperlipidemia Brother   . Diabetes Brother   . Colon polyps Brother   . Diabetes Brother   . Heart disease Brother   . Hypertension Brother   . Hyperlipidemia Brother   . Colon polyps Brother   . Esophageal cancer Neg Hx   . Rectal cancer Neg Hx   . Stomach cancer Neg Hx      Current Outpatient Medications (Cardiovascular):  .  amLODipine (NORVASC) 10 MG tablet, Take 1 tablet (10 mg  total) by mouth daily. .  furosemide (LASIX) 20 MG tablet, TAKE 1 TABLET BY MOUTH EVERY DAY IF NEEDED  Current Outpatient Medications (Respiratory):  Marland Kitchen  Azelastine HCl 0.15 % SOLN, Place 2 sprays into the nose every 12 (twelve) hours as needed. .  montelukast (SINGULAIR) 10 MG tablet, Take 10 mg by mouth every evening.  Current Outpatient Medications (Analgesics):  .  meloxicam (MOBIC) 15 MG tablet, Take 1 tablet (15 mg total) by mouth daily as needed for pain. .  naproxen (NAPROSYN) 500 MG tablet, Take 1 tablet (500 mg total) by mouth 2 (two) times daily as needed. .  tapentadol HCl (NUCYNTA) 75 MG tablet, Take 1 tablet (75 mg total) by mouth daily as needed.   Current Outpatient Medications (Other):  .  brimonidine (ALPHAGAN P) 0.1 % SOLN, 3 (three) times daily.   .  dorzolamide-timolol (COSOPT) 22.3-6.8 MG/ML ophthalmic solution, 1 drop 2 (two) times daily.   Marland Kitchen  lidocaine (LIDODERM) 5 %, Place 1 patch onto the skin daily. Remove & Discard patch within 12 hours or as directed by MD .  methocarbamol (ROBAXIN) 500 MG tablet, Take 0.5-1 tablets (250-500 mg total) by mouth every 8 (eight) hours as needed for muscle spasms. .  RHOPRESSA 0.02 % SOLN,  .  VYZULTA 0.024 % SOLN, instill 1 drop at bedtime into left eye    Past medical history, social, surgical and family history all reviewed in electronic medical record.  No pertanent information unless stated regarding to the chief complaint.   Review of Systems:  No headache, visual changes, nausea, vomiting, diarrhea, constipation, dizziness, abdominal pain, skin rash, fevers, chills, night sweats, weight loss, swollen lymph nodes,, chest pain, shortness of breath, mood changes.  Positive muscle aches and joint swelling  Objective  Blood pressure 110/70, pulse 69, height 5\' 6"  (1.676 m), weight 175 lb (79.4 kg), SpO2 97 %.    General: No apparent distress alert and oriented x3 mood and affect normal, dressed appropriately.  HEENT: Pupils  equal, extraocular movements intact  Respiratory: Patient's speak in full sentences and does not appear short of breath  Cardiovascular: No lower extremity edema, non tender, no erythema  Skin: Warm dry intact with no signs of infection or rash on extremities or on axial skeleton.  Abdomen: Soft nontender  Neuro: Cranial nerves II through XII are intact, neurovascularly intact in all extremities with 2+ DTRs and 2+ pulses.  Lymph: No lymphadenopathy of posterior  or anterior cervical chain or axillae bilaterally.  Gait antalgic using a walker MSK:  tender and pain is out of proportion to the amount of palpation with mild limited range of motion and good stability and symmetric strength and tone of shoulders, elbows,bilaterally.   Left ankle does have swelling.  Diffusely tender to palpation.  Patient has most pain over ATFL.  Patient has some mild laxity with anterior drawer but there is a positive endpoint.  Once again diffusely tender so difficult to assess secondary to patient with voluntary guarding.  Does have an effusion of the ankle that is mild.  Mild pain over the medial and lateral malleolus but not as severe.  Achilles intact.  Neurovascular intact distally.  Contralateral foot shows the patient does have bruising of the first toe but does have good range of motion with mild hallux limitus.  Mild bruising noted.  Negative squeeze test    Impression and Recommendations:     This case required medical decision making of moderate complexity. The above documentation has been reviewed and is accurate and complete Lyndal Pulley, DO       Note: This dictation was prepared with Dragon dictation along with smaller phrase technology. Any transcriptional errors that result from this process are unintentional.

## 2019-05-02 ENCOUNTER — Telehealth: Payer: Self-pay | Admitting: Emergency Medicine

## 2019-05-02 NOTE — Telephone Encounter (Signed)
Per OV on 04/30/19 with pt was given Pennsaid with direction of use.

## 2019-05-02 NOTE — Telephone Encounter (Signed)
Pt is calling stating she would like something for pain due a fx in her ankle. Pt saw Dr Tamala Julian on 04/30/19. Please advise as he is off today. Pt lives alone and does not have any help and is having to be on her ankle often.

## 2019-05-02 NOTE — Telephone Encounter (Signed)
LVM for pt to return call.  Routing to [employer] pt does not call back before Monday

## 2019-05-02 NOTE — Telephone Encounter (Signed)
This doesn't answer my question

## 2019-05-02 NOTE — Telephone Encounter (Signed)
Is she using the pennsaid?

## 2019-05-14 DIAGNOSIS — H401122 Primary open-angle glaucoma, left eye, moderate stage: Secondary | ICD-10-CM | POA: Diagnosis not present

## 2019-05-21 ENCOUNTER — Other Ambulatory Visit: Payer: Self-pay

## 2019-05-21 ENCOUNTER — Ambulatory Visit: Payer: Self-pay

## 2019-05-21 ENCOUNTER — Ambulatory Visit (INDEPENDENT_AMBULATORY_CARE_PROVIDER_SITE_OTHER): Payer: Medicare Other | Admitting: Family Medicine

## 2019-05-21 ENCOUNTER — Encounter: Payer: Self-pay | Admitting: Family Medicine

## 2019-05-21 VITALS — BP 120/68 | HR 54 | Ht 66.0 in | Wt 165.0 lb

## 2019-05-21 DIAGNOSIS — G8929 Other chronic pain: Secondary | ICD-10-CM

## 2019-05-21 DIAGNOSIS — M25572 Pain in left ankle and joints of left foot: Secondary | ICD-10-CM | POA: Diagnosis not present

## 2019-05-21 DIAGNOSIS — S92145G Nondisplaced dome fracture of left talus, subsequent encounter for fracture with delayed healing: Secondary | ICD-10-CM | POA: Diagnosis not present

## 2019-05-21 NOTE — Patient Instructions (Signed)
Exercises 3x a week See me again in 4-5 weeks

## 2019-05-21 NOTE — Progress Notes (Signed)
[name] [place] [employer], [place] [address] Phone: [phone] Subjective:   Jennifer Huang, am serving as a scribe for Dr. Hulan Saas.     CC: Ankle pain follow-up  BJY:[id number]   04/30/2019: Ankle sprain noted.  Patient does have swelling.  Some tenderness over the ATFL.  X-rays questionable avulsion fracture of the talar dome.  Independently visualized by me awaiting the overview from radiology..  Icing regimen, topical anti-inflammatories, work with Product/process development scientist to learn home exercises.  Discussed proper shoes with rigid sole shoe.  Follow-up again in 3 weeks patient worsening pain will need to consider the possibility of a boot.  Call if symptoms worsen  Update 05/21/2019: Jennifer Huang is a 68 y.o. female coming in with complaint of avulsion of the talar dome. Patient states that she is having slow improvement. Does have swelling and soreness which increases her symptoms. Is using AirCast as much as possible.   Is also having left knee pain. Pain in middle of night. Pain in calf and behind the knee as well as on medial aspect. Patient feels that knee becomes stuck in flexed position. Had injection in right knee which did help her pain one year ago.      Past Medical History:  Diagnosis Date  . Allergy    year around allergies  . Chronic diastolic (congestive) heart failure ([place]) 05/10/2017  . Colon polyps    anal polyp  . Gout, unspecified   . Hyperlipidemia   . Primary localized osteoarthrosis, lower leg   . Unspecified essential hypertension   . Unspecified glaucoma(365.9)    Left Eye   Past Surgical History:  Procedure Laterality Date  . COLON SURGERY     anal polyp excised gen'l anesthesia  . COLONOSCOPY  10-14-2010   jacobs-hx TA 2006  . ENUCLEATION     OD 2nd to Glaucoma  . OOPHORECTOMY     Had Blockage  . SALPINGECTOMY     Had Blockage  . TOTAL ABDOMINAL HYSTERECTOMY  2002   Social History   Socioeconomic  History  . Marital status: Widowed    Spouse name: Not on file  . Number of children: 0  . Years of education: Not on file  . Highest education level: Not on file  Occupational History  . 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 Financial resource strain: Not hard at all  . Food insecurity    Worry: Never true    Inability: Never true  . Transportation needs    Medical: Huang    Non-medical: Huang  Tobacco Use  . Smoking status: Never Smoker  . Smokeless tobacco: Never Used  Substance and Sexual Activity  . Alcohol use: Huang    Alcohol/week: 0.0 standard drinks  . Drug use: Huang  . Sexual activity: Never    Partners: Male  Lifestyle  . Physical activity    Days per week: 3 days    Minutes per session: 40 min  . Stress: Not at all  Relationships  . Social connections    Talks on phone: More than three times a week    Gets together: More than three times a week    Attends religious service: More than 4 times per year    Active member of club or organization: Yes    Attends meetings of clubs or organizations: More than 4 times per year  Relationship status: Widowed  Other Topics Concern  . Not on file  Social History Narrative   HSG, @ year college. Married '73. Huang children. On disability - orthopedic: knee, back, shortened leg.. SO - self-employed.      Dec '12 - many stressors: husband with cancer, brother with pancreatic cancer, housing and financial issues.             Allergies  Allergen Reactions  . Penicillins     REACTION: rash/hives  . Sulfonamide Derivatives     REACTION: rash/hives   Family History  Problem Relation Age of Onset  . Osteoarthritis Mother   . Hypertension Mother   . Emphysema Father   . Diabetes Father   . Hypertension Father   . Arthritis Sister        hip replacments, wrist surgery  . Hyperlipidemia Sister   . Hypertension Sister   . Diabetes Brother   . Hypertension Brother   .  Hyperlipidemia Brother   . Cancer - Other Brother   . Heart disease Brother   . Hypertension Brother   . Hyperlipidemia Brother   . Diabetes Brother   . Colon polyps Brother   . Diabetes Brother   . Heart disease Brother   . Hypertension Brother   . Hyperlipidemia Brother   . Colon polyps Brother   . Esophageal cancer Neg Hx   . Rectal cancer Neg Hx   . Stomach cancer Neg Hx      Current Outpatient Medications (Cardiovascular):  .  amLODipine (NORVASC) 10 MG tablet, Take 1 tablet (10 mg total) by mouth daily. .  furosemide (LASIX) 20 MG tablet, TAKE 1 TABLET BY MOUTH EVERY DAY IF NEEDED  Current Outpatient Medications (Respiratory):  Marland Kitchen  Azelastine HCl 0.15 % SOLN, Place 2 sprays into the nose every 12 (twelve) hours as needed. .  montelukast (SINGULAIR) 10 MG tablet, Take 10 mg by mouth every evening.  Current Outpatient Medications (Analgesics):  .  meloxicam (MOBIC) 15 MG tablet, Take 1 tablet (15 mg total) by mouth daily as needed for pain. .  naproxen (NAPROSYN) 500 MG tablet, Take 1 tablet (500 mg total) by mouth 2 (two) times daily as needed. .  tapentadol HCl (NUCYNTA) 75 MG tablet, Take 1 tablet (75 mg total) by mouth daily as needed.   Current Outpatient Medications (Other):  .  brimonidine (ALPHAGAN P) 0.1 % SOLN, 3 (three) times daily.   .  dorzolamide-timolol (COSOPT) 22.3-6.8 MG/ML ophthalmic solution, 1 drop 2 (two) times daily.   Marland Kitchen  lidocaine (LIDODERM) 5 %, Place 1 patch onto the skin daily. Remove & Discard patch within 12 hours or as directed by MD .  methocarbamol (ROBAXIN) 500 MG tablet, Take 0.5-1 tablets (250-500 mg total) by mouth every 8 (eight) hours as needed for muscle spasms. .  RHOPRESSA 0.02 % SOLN,  .  VYZULTA 0.024 % SOLN, instill 1 drop at bedtime into left eye    Past medical history, social, surgical and family history all reviewed in electronic medical record.  Huang pertanent information unless stated regarding to the chief complaint.    Review of Systems:  Huang headache, visual changes, nausea, vomiting, diarrhea, constipation, dizziness, abdominal pain, skin rash, fevers, chills, night sweats, weight loss, swollen lymph nodes, body aches, joint swelling, muscle aches, chest pain, shortness of breath, mood changes.   Objective  Blood pressure 120/68, pulse (!) 54, height 5\' 6"  (1.676 m), weight 165 lb (74.8 kg), SpO2 97 %.  General: Huang apparent distress alert and oriented x3 mood and affect normal, dressed appropriately.  HEENT: Pupils equal, extraocular movements intact patient does have glaucoma left eye Respiratory: Patient's speak in full sentences and does not appear short of breath  Cardiovascular: 1+ lower extremity edema, non tender, Huang erythema  Skin: Warm dry intact with Huang signs of infection or rash on extremities or on axial skeleton.  Abdomen: Soft nontender  Neuro: Cranial nerves II through XII are intact, neurovascularly intact in all extremities with 2+ DTRs and 2+ pulses.  Lymph: Huang lymphadenopathy of posterior or anterior cervical chain or axillae bilaterally.  Gait antalgic  MSK:  Non tender with full range of motion and good stability and symmetric strength and tone of shoulders, elbows, wrist, hip, knee bilaterally.  Ankle: Left ankle Trace swelling still noted of the left ankle Range of motion limited with dorsiflexion and plantarflexion. Strength is 5/5 in all directions. Stable lateral and medial ligaments; squeeze test and kleiger test unremarkable; Talar dome still tender; Huang pain at base of 5th MT; Huang tenderness over cuboid; Huang tenderness over N spot or navicular prominence Huang tenderness on posterior aspects of lateral and medial malleolus Huang sign of peroneal tendon subluxations or tenderness to palpation Negative tarsal tunnel tinel's Able to walk 4 steps.  Antalgic  Limited musculoskeletal ultrasound was performed and interpreted by Lyndal Pulley  Limited ultrasound of patient's left  ankle shows the patient still has some mild cortical defect of the talar dome.  Hypoechoic changes in this area.  Huang signs of any type of loose body or anything that would suggest an OCD at this time.  Small trace effusion of the ankle mortise noted Impression: Mild interval healing of talar dome fracture but possibly delayed   Impression and Recommendations:     This case required medical decision making of moderate complexity. The above documentation has been reviewed and is accurate and complete Lyndal Pulley, DO       Note: This dictation was prepared with Dragon dictation along with smaller phrase technology. Any transcriptional errors that result from this process are unintentional.

## 2019-05-22 ENCOUNTER — Encounter: Payer: Self-pay | Admitting: Family Medicine

## 2019-05-22 DIAGNOSIS — S92143A Displaced dome fracture of unspecified talus, initial encounter for closed fracture: Secondary | ICD-10-CM | POA: Insufficient documentation

## 2019-05-22 NOTE — Assessment & Plan Note (Signed)
Patient still has what appears to be delayed healing.  Discussed with patient in great length.  Discussed home exercises, which activities to doing which wants to avoid.  Discussed posture and ergonomics.  Patient will follow-up again in 4 to 8 weeks patient declined formal physical therapy at this time

## 2019-05-29 ENCOUNTER — Encounter: Payer: Self-pay | Admitting: Family Medicine

## 2019-05-29 ENCOUNTER — Other Ambulatory Visit: Payer: Self-pay

## 2019-05-29 ENCOUNTER — Ambulatory Visit (INDEPENDENT_AMBULATORY_CARE_PROVIDER_SITE_OTHER): Payer: Medicare Other | Admitting: Family Medicine

## 2019-05-29 DIAGNOSIS — S92145G Nondisplaced dome fracture of left talus, subsequent encounter for fracture with delayed healing: Secondary | ICD-10-CM | POA: Diagnosis not present

## 2019-05-29 DIAGNOSIS — M1712 Unilateral primary osteoarthritis, left knee: Secondary | ICD-10-CM | POA: Diagnosis not present

## 2019-05-29 NOTE — Progress Notes (Signed)
[facility] [address] [place], [place] [address] Phone: [phone] Subjective:   I Jennifer Huang am serving as a Education administrator for Dr. Hulan Saas.  I'm seeing this patient by the request  of:    CC: Left knee and left ankle pain  BJY:[id number]   05/21/2019 Patient still has what appears to be delayed healing.  Discussed with patient in great length.  Discussed home exercises, which activities to doing which wants to avoid.  Discussed posture and ergonomics.  Patient will follow-up again in 4 to 8 weeks patient declined formal physical therapy at this time  05/29/2019 Jennifer Huang is a 68 y.o. female coming in with complaint of left knee and ankle pain. Some pain with the ankle. Left knee locks and gives away. Would like a knee injection.   Patient was seen previously and did have more of a left ankle and talar dome contusion versus possible avulsion.  Patient states that he is feeling significantly better at this time.  Still using a cane for ambulation but feels it is more secondary to her knee.  Patient had seen somebody previously for the right knee and has known arthritic changes.  Feels like she responded well to an injection previously.  Feels that the left knee continues to have pain.     Past Medical History:  Diagnosis Date  . Allergy    year around allergies  . Chronic diastolic (congestive) heart failure ([place]) 05/10/2017  . Colon polyps    anal polyp  . Gout, unspecified   . Hyperlipidemia   . Primary localized osteoarthrosis, lower leg   . Unspecified essential hypertension   . Unspecified glaucoma(365.9)    Left Eye   Past Surgical History:  Procedure Laterality Date  . COLON SURGERY     anal polyp excised gen'l anesthesia  . COLONOSCOPY  10-14-2010   jacobs-hx TA 2006  . ENUCLEATION     OD 2nd to Glaucoma  . OOPHORECTOMY     Had Blockage  . SALPINGECTOMY     Had Blockage  . TOTAL ABDOMINAL HYSTERECTOMY  2002   Social  History   Socioeconomic History  . Marital status: Widowed    Spouse name: Not on file  . Number of children: 0  . Years of education: Not on file  . Highest education level: Not on file  Occupational History  . 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 Financial resource strain: Not hard at all  . Food insecurity    Worry: Never true    Inability: Never true  . Transportation needs    Medical: No    Non-medical: No  Tobacco Use  . Smoking status: Never Smoker  . Smokeless tobacco: Never Used  Substance and Sexual Activity  . Alcohol use: No    Alcohol/week: 0.0 standard drinks  . Drug use: No  . Sexual activity: Never    Partners: Male  Lifestyle  . Physical activity    Days per week: 3 days    Minutes per session: 40 min  . Stress: Not at all  Relationships  . Social connections    Talks on phone: More than three times a week    Gets together: More than three times a week    Attends religious service: More than 4 times per year    Active member of club or organization: Yes    Attends meetings of  clubs or organizations: More than 4 times per year    Relationship status: Widowed  Other Topics Concern  . Not on file  Social History Narrative   HSG, @ year college. Married '73. No children. On disability - orthopedic: knee, back, shortened leg.. SO - self-employed.      Dec '12 - many stressors: husband with cancer, brother with pancreatic cancer, housing and financial issues.             Allergies  Allergen Reactions  . Penicillins     REACTION: rash/hives  . Sulfonamide Derivatives     REACTION: rash/hives   Family History  Problem Relation Age of Onset  . Osteoarthritis Mother   . Hypertension Mother   . Emphysema Father   . Diabetes Father   . Hypertension Father   . Arthritis Sister        hip replacments, wrist surgery  . Hyperlipidemia Sister   . Hypertension Sister   . Diabetes Brother   .  Hypertension Brother   . Hyperlipidemia Brother   . Cancer - Other Brother   . Heart disease Brother   . Hypertension Brother   . Hyperlipidemia Brother   . Diabetes Brother   . Colon polyps Brother   . Diabetes Brother   . Heart disease Brother   . Hypertension Brother   . Hyperlipidemia Brother   . Colon polyps Brother   . Esophageal cancer Neg Hx   . Rectal cancer Neg Hx   . Stomach cancer Neg Hx      Current Outpatient Medications (Cardiovascular):  .  amLODipine (NORVASC) 10 MG tablet, Take 1 tablet (10 mg total) by mouth daily. .  furosemide (LASIX) 20 MG tablet, TAKE 1 TABLET BY MOUTH EVERY DAY IF NEEDED  Current Outpatient Medications (Respiratory):  Marland Kitchen  Azelastine HCl 0.15 % SOLN, Place 2 sprays into the nose every 12 (twelve) hours as needed. .  montelukast (SINGULAIR) 10 MG tablet, Take 10 mg by mouth every evening.  Current Outpatient Medications (Analgesics):  .  meloxicam (MOBIC) 15 MG tablet, Take 1 tablet (15 mg total) by mouth daily as needed for pain. .  naproxen (NAPROSYN) 500 MG tablet, Take 1 tablet (500 mg total) by mouth 2 (two) times daily as needed. .  tapentadol HCl (NUCYNTA) 75 MG tablet, Take 1 tablet (75 mg total) by mouth daily as needed.   Current Outpatient Medications (Other):  .  brimonidine (ALPHAGAN P) 0.1 % SOLN, 3 (three) times daily.   .  dorzolamide-timolol (COSOPT) 22.3-6.8 MG/ML ophthalmic solution, 1 drop 2 (two) times daily.   Marland Kitchen  lidocaine (LIDODERM) 5 %, Place 1 patch onto the skin daily. Remove & Discard patch within 12 hours or as directed by MD .  methocarbamol (ROBAXIN) 500 MG tablet, Take 0.5-1 tablets (250-500 mg total) by mouth every 8 (eight) hours as needed for muscle spasms. .  RHOPRESSA 0.02 % SOLN,  .  VYZULTA 0.024 % SOLN, instill 1 drop at bedtime into left eye    Past medical history, social, surgical and family history all reviewed in electronic medical record.  No pertanent information unless stated regarding to  the chief complaint.   Review of Systems:  No headache, visual changes, nausea, vomiting, diarrhea, constipation, dizziness, abdominal pain, skin rash, fevers, chills, night sweats, weight loss, swollen lymph nodes, body aches,, chest pain, shortness of breath, mood changes.  Positive muscle aches and joint swelling  Objective  Blood pressure 140/72, pulse 67, height 5\' 6"  (  1.676 m), weight 165 lb (74.8 kg), SpO2 97 %.    General: No apparent distress alert and oriented x3 mood and affect normal, dressed appropriately.  HEENT: Pupils asymmetric, extraocular movements intact  Respiratory: Patient's speak in full sentences and does not appear short of breath  Cardiovascular: Trace lower extremity edema, non tender, no erythema  Skin: Warm dry intact with no signs of infection or rash on extremities or on axial skeleton.  Abdomen: Soft nontender  Neuro: Cranial nerves II through XII are intact, neurovascularly intact in all extremities with 2+ DTRs and 2+ pulses.  Lymph: No lymphadenopathy of posterior or anterior cervical chain or axillae bilaterally.  Gait antalgic gait walking with the aid of a cane MSK:  tender with mild limited range of motion and good stability and symmetric strength and tone of shoulders, elbows, wrist, hip, .  Left ankle shows significant decrease in swelling.  Still mildly tender over the talar dome.  Good stability of the ankle.  Good strength of the ankle.  Patient's left knee does have trace effusion noted.  Lacks last 5 degrees of flexion in the last 2 degrees of extension.  Tender to palpation of the patellofemoral as well as the medial joint space.  Neurovascular intact distally.  Mild instability noted with valgus force.  After informed written and verbal consent, patient was seated on exam table. Left knee was prepped with alcohol swab and utilizing anterolateral approach, patient's left knee space was injected with 4:1  marcaine 0.5%: Kenalog 40mg /dL. Patient  tolerated the procedure well without immediate complications.   Impression and Recommendations:     This case required medical decision making of moderate complexity. The above documentation has been reviewed and is accurate and complete Lyndal Pulley, DO       Note: This dictation was prepared with Dragon dictation along with smaller phrase technology. Any transcriptional errors that result from this process are unintentional.

## 2019-05-29 NOTE — Assessment & Plan Note (Signed)
Patient appears to be doing better.  We discussed the possibility of repeating x-rays or the ultrasound which patient declined.  Feels like she is making progress and is near her baseline.  We will continue to monitor and follow-up again in 4 to 6 weeks

## 2019-05-29 NOTE — Patient Instructions (Signed)
Good to see you Ice on the knee Keep up the exercise for the ankle See me again in 6-8 weeks

## 2019-05-29 NOTE — Assessment & Plan Note (Signed)
Patient given injection today and tolerated the procedure well.  We discussed icing regimen and home exercise.  We discussed topical anti-inflammatories.  Discussed the possibility of need for Visco supplementation.  Patient has many other comorbidities and chronic pain syndromes.  Follow-up again in 4 to 6 weeks

## 2019-06-04 ENCOUNTER — Telehealth: Payer: Self-pay | Admitting: Internal Medicine

## 2019-06-04 DIAGNOSIS — Z4421 Encounter for fitting and adjustment of artificial right eye: Secondary | ICD-10-CM | POA: Diagnosis not present

## 2019-06-04 NOTE — Telephone Encounter (Signed)
Pt calling in states that she needs an order for a prosthetic eye replacement.  States that she goes to [facility] and they had an appointment with her today.  The eye she has is just so old that it is starting to hold bacteria behind it and cause an infection.  They can not just replace without doctors orders.   Daisy Lazar is the point of contact at the office and can be reached at [phone].

## 2019-06-04 NOTE — Telephone Encounter (Signed)
Copied from [place] [phone]. Topic: General - Other >> Jun 04, 2019  3:36 PM Leward Quan A wrote: Reason for CRM: Olin Hauser with [facility] called to request an Rx new orders for patient to get fitted for a new prosthetic Right eye. Asking for this ASAP and only one appointment date left for this month before Dr retire at end of the month. Please advise   Ph# [phone] Fax# [phone]

## 2019-06-05 NOTE — Telephone Encounter (Signed)
Order should come from her eye doctor.

## 2019-06-05 NOTE — Telephone Encounter (Signed)
Called patient and states that she only sees a glaucoma eye doctor that does not do orders like that. The place states that they need something from PCP stating that it is okay for the eye to be replaced to prevent infections in the socket.  Patient states she has never had to have an order for this to be replaced but this year they are needing it for insurance. Patient hoping this can be done as she needs it to be replaced eye is lid is drooping and she doesn't have another option to get the order. States that if you need can talk to the place number is below if needed.

## 2019-06-05 NOTE — Telephone Encounter (Signed)
Patient needs a letter or order can you make her an appointment since we have not seen her recently. Thank you

## 2019-06-05 NOTE — Telephone Encounter (Signed)
Okay to give orders to replace. If they need an office note discussing she would need visit.

## 2019-06-05 NOTE — Telephone Encounter (Signed)
Appointment made for tomorrow at 9:20

## 2019-06-05 NOTE — Telephone Encounter (Signed)
See separate note of same topic.

## 2019-06-06 ENCOUNTER — Ambulatory Visit (INDEPENDENT_AMBULATORY_CARE_PROVIDER_SITE_OTHER): Payer: Medicare Other | Admitting: Internal Medicine

## 2019-06-06 ENCOUNTER — Encounter: Payer: Self-pay | Admitting: Internal Medicine

## 2019-06-06 ENCOUNTER — Other Ambulatory Visit: Payer: Self-pay

## 2019-06-06 VITALS — BP 120/70 | HR 72 | Temp 98.5°F | Ht 66.0 in | Wt 171.0 lb

## 2019-06-06 DIAGNOSIS — Z970 Presence of artificial eye: Secondary | ICD-10-CM | POA: Insufficient documentation

## 2019-06-06 NOTE — Patient Instructions (Signed)
We have given you a copy of the letter to get the eye replaced.

## 2019-06-06 NOTE — Progress Notes (Signed)
   Subjective:   Patient ID: Jennifer Huang, female    DOB: [birth year], 68 y.o.   MRN: [id number]  HPI The patient is a 68 YO female coming in for concerns about aging eye prosthesis. She is needing a replacement and there are concerns about bacteria getting trapped behind the eye and potential for infection in the future. They are requiring a note and order for this which they never have in the past. She did contact her eye doctor and they were unable to do this for her. She denies current pain or infection. She has had current artifical eye since 2014.   Review of Systems  Constitutional: Negative.   HENT: Negative.   Eyes: Negative.        Eye drooping  Respiratory: Negative for cough, chest tightness and shortness of breath.   Cardiovascular: Negative for chest pain, palpitations and leg swelling.  Gastrointestinal: Negative for abdominal distention, abdominal pain, constipation, diarrhea, nausea and vomiting.  Musculoskeletal: Negative.   Skin: Negative.   Neurological: Negative.   Psychiatric/Behavioral: Negative.     Objective:  Physical Exam Constitutional:      Appearance: She is well-developed.  HENT:     Head: Normocephalic and atraumatic.  Eyes:     Comments: Right eye drooping  Neck:     Musculoskeletal: Normal range of motion.  Cardiovascular:     Rate and Rhythm: Normal rate and regular rhythm.  Pulmonary:     Effort: Pulmonary effort is normal. No respiratory distress.     Breath sounds: Normal breath sounds. No wheezing or rales.  Abdominal:     General: Bowel sounds are normal. There is no distension.     Palpations: Abdomen is soft.     Tenderness: There is no abdominal tenderness. There is no rebound.  Skin:    General: Skin is warm and dry.  Neurological:     Mental Status: She is alert and oriented to person, place, and time.     Coordination: Coordination normal.     Vitals:   06/06/19 0913  BP: 120/70  Pulse: 72  Temp: 98.5 F (36.9 C)   TempSrc: Oral  SpO2: 98%  Weight: 171 lb (77.6 kg)  Height: 5\' 6"  (1.676 m)    Assessment & Plan:

## 2019-06-06 NOTE — Assessment & Plan Note (Signed)
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 Verbal order given and note given to support necessity for replacement.

## 2019-06-24 ENCOUNTER — Ambulatory Visit: Payer: Medicare Other | Admitting: Family Medicine

## 2019-07-04 DIAGNOSIS — J300 Vasomotor rhinitis: Secondary | ICD-10-CM | POA: Diagnosis not present

## 2019-07-04 DIAGNOSIS — Z91030 Bee allergy status: Secondary | ICD-10-CM | POA: Diagnosis not present

## 2019-07-10 ENCOUNTER — Ambulatory Visit (INDEPENDENT_AMBULATORY_CARE_PROVIDER_SITE_OTHER): Payer: Medicare Other | Admitting: Family Medicine

## 2019-07-10 ENCOUNTER — Other Ambulatory Visit: Payer: Self-pay

## 2019-07-10 ENCOUNTER — Encounter: Payer: Self-pay | Admitting: Family Medicine

## 2019-07-10 DIAGNOSIS — S92145G Nondisplaced dome fracture of left talus, subsequent encounter for fracture with delayed healing: Secondary | ICD-10-CM | POA: Diagnosis not present

## 2019-07-10 NOTE — Patient Instructions (Signed)
For the large toe wart remover cream and cover with a Band-Aid Continue compression icing and exercising See me again in 2 months

## 2019-07-10 NOTE — Progress Notes (Signed)
[facility] [address] [place], [place] [address] Phone: [phone] Subjective:   I Jennifer Huang am serving as a Education administrator for Dr. Hulan Saas.  I'm seeing this patient by the request  of:    CC: Ankle pain follow-up  [id number]   05/29/2019 Patient given injection today and tolerated the procedure well.  We discussed icing regimen and home exercise.  We discussed topical anti-inflammatories.  Discussed the possibility of need for Visco supplementation.  Patient has many other comorbidities and chronic pain syndromes.  Follow-up again in 4 to 6 weeks  Patient appears to be doing better.  We discussed the possibility of repeating x-rays or the ultrasound which patient declined.  Feels like she is making progress and is near her baseline.  We will continue to monitor and follow-up again in 4 to 6 weeks  07/10/2019 Jennifer Huang is a 68 y.o. female coming in with complaint of ankle pain. States the ankle is doing better. Still experiences achy pain and stiffness.  States that it does not stop her from any activity.  Patient states that has been able to increase activity at a reasonable pace.     Past Medical History:  Diagnosis Date  . Allergy    year around allergies  . Chronic diastolic (congestive) heart failure ([place]) 05/10/2017  . Colon polyps    anal polyp  . Gout, unspecified   . Hyperlipidemia   . Primary localized osteoarthrosis, lower leg   . Unspecified essential hypertension   . Unspecified glaucoma(365.9)    Left Eye   Past Surgical History:  Procedure Laterality Date  . COLON SURGERY     anal polyp excised gen'l anesthesia  . COLONOSCOPY  10-14-2010   jacobs-hx TA 2006  . ENUCLEATION     OD 2nd to Glaucoma  . OOPHORECTOMY     Had Blockage  . SALPINGECTOMY     Had Blockage  . TOTAL ABDOMINAL HYSTERECTOMY  2002   Social History   Socioeconomic History  . Marital status: Widowed    Spouse name: Not on file  . Number  of children: 0  . Years of education: Not on file  . Highest education level: Not on file  Occupational History  . 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 Financial resource strain: Not hard at all  . Food insecurity    Worry: Never true    Inability: Never true  . Transportation needs    Medical: No    Non-medical: No  Tobacco Use  . Smoking status: Never Smoker  . Smokeless tobacco: Never Used  Substance and Sexual Activity  . Alcohol use: No    Alcohol/week: 0.0 standard drinks  . Drug use: No  . Sexual activity: Never    Partners: Male  Lifestyle  . Physical activity    Days per week: 3 days    Minutes per session: 40 min  . Stress: Not at all  Relationships  . Social connections    Talks on phone: More than three times a week    Gets together: More than three times a week    Attends religious service: More than 4 times per year    Active member of club or organization: Yes    Attends meetings of clubs or organizations: More than 4 times per year    Relationship status: Widowed  Other Topics Concern  . Not on file  Social History Narrative   HSG, @ year college. Married '73. No children. On disability - orthopedic: knee, back, shortened leg.. SO - self-employed.      Dec '12 - many stressors: husband with cancer, brother with pancreatic cancer, housing and financial issues.             Allergies  Allergen Reactions  . Penicillins     REACTION: rash/hives  . Sulfonamide Derivatives     REACTION: rash/hives   Family History  Problem Relation Age of Onset  . Osteoarthritis Mother   . Hypertension Mother   . Emphysema Father   . Diabetes Father   . Hypertension Father   . Arthritis Sister        hip replacments, wrist surgery  . Hyperlipidemia Sister   . Hypertension Sister   . Diabetes Brother   . Hypertension Brother   . Hyperlipidemia Brother   . Cancer - Other Brother   . Heart disease  Brother   . Hypertension Brother   . Hyperlipidemia Brother   . Diabetes Brother   . Colon polyps Brother   . Diabetes Brother   . Heart disease Brother   . Hypertension Brother   . Hyperlipidemia Brother   . Colon polyps Brother   . Esophageal cancer Neg Hx   . Rectal cancer Neg Hx   . Stomach cancer Neg Hx      Current Outpatient Medications (Cardiovascular):  .  amLODipine (NORVASC) 10 MG tablet, Take 1 tablet (10 mg total) by mouth daily. .  furosemide (LASIX) 20 MG tablet, TAKE 1 TABLET BY MOUTH EVERY DAY IF NEEDED  Current Outpatient Medications (Respiratory):  Marland Kitchen  Azelastine HCl 0.15 % SOLN, Place 2 sprays into the nose every 12 (twelve) hours as needed. .  montelukast (SINGULAIR) 10 MG tablet, Take 10 mg by mouth every evening.  Current Outpatient Medications (Analgesics):  .  meloxicam (MOBIC) 15 MG tablet, Take 1 tablet (15 mg total) by mouth daily as needed for pain. .  naproxen (NAPROSYN) 500 MG tablet, Take 1 tablet (500 mg total) by mouth 2 (two) times daily as needed. .  tapentadol HCl (NUCYNTA) 75 MG tablet, Take 1 tablet (75 mg total) by mouth daily as needed.   Current Outpatient Medications (Other):  .  brimonidine (ALPHAGAN P) 0.1 % SOLN, 3 (three) times daily.   .  dorzolamide-timolol (COSOPT) 22.3-6.8 MG/ML ophthalmic solution, 1 drop 2 (two) times daily.   Marland Kitchen  lidocaine (LIDODERM) 5 %, Place 1 patch onto the skin daily. Remove & Discard patch within 12 hours or as directed by MD .  methocarbamol (ROBAXIN) 500 MG tablet, Take 0.5-1 tablets (250-500 mg total) by mouth every 8 (eight) hours as needed for muscle spasms. .  RHOPRESSA 0.02 % SOLN,  .  VYZULTA 0.024 % SOLN, instill 1 drop at bedtime into left eye    Past medical history, social, surgical and family history all reviewed in electronic medical record.  No pertanent information unless stated regarding to the chief complaint.   Review of Systems:  No headache, visual changes, nausea, vomiting,  diarrhea, constipation, dizziness, abdominal pain, skin rash, fevers, chills, night sweats, weight loss, swollen lymph nodes, body aches, joint swelling, muscle aches, chest pain, shortness of breath, mood changes.   Objective  Blood pressure 120/60, pulse 67, height 5\' 6"  (1.676 m), weight 172 lb (78 kg), SpO2 98 %. Systems examined below as of    General: No apparent distress alert and oriented x3 mood  and affect normal, dressed appropriately.  HEENT: Pupils equal, extraocular movements intact  Respiratory: Patient's speak in full sentences and does not appear short of breath  Cardiovascular: No lower extremity edema, non tender, no erythema  Skin: Warm dry intact with no signs of infection or rash on extremities or on axial skeleton.  Abdomen: Soft nontender  Neuro: Cranial nerves II through XII are intact, neurovascularly intact in all extremities with 2+ DTRs and 2+ pulses.  Lymph: No lymphadenopathy of posterior or anterior cervical chain or axillae bilaterally.  Gait antalgic MSK:  Non tender with full range of motion and good stability and symmetric strength and tone of shoulders, elbows, wrist, hip, knee bilaterally.  Mild arthritic changes   Patient's left ankle still has a very trace effusion noted.  Improvement in range of motion but still lacking the last 5 degrees of dorsiflexion.  Still minorly tender to palpation over the talar dome. Impression and Recommendations:     This case required medical decision making of moderate complexity. The above documentation has been reviewed and is accurate and complete Lyndal Pulley, DO       Note: This dictation was prepared with Dragon dictation along with smaller phrase technology. Any transcriptional errors that result from this process are unintentional.

## 2019-07-10 NOTE — Assessment & Plan Note (Signed)
Patient is able to bear weight and do all daily activities without any significant difficulty.  Patient has underlying arthritic changes of multiple other joints that likely could be contributing to some of the abnormality in the gait.  Patient as long as it is well can follow-up with me again in 3 months.  Worsening symptoms will need to consider advanced imaging but I think patient will do well at this time

## 2019-07-28 ENCOUNTER — Ambulatory Visit (INDEPENDENT_AMBULATORY_CARE_PROVIDER_SITE_OTHER): Payer: Medicare Other | Admitting: Internal Medicine

## 2019-07-28 ENCOUNTER — Encounter: Payer: Medicare HMO | Admitting: Internal Medicine

## 2019-07-28 ENCOUNTER — Other Ambulatory Visit: Payer: Self-pay

## 2019-07-28 ENCOUNTER — Encounter: Payer: Self-pay | Admitting: Internal Medicine

## 2019-07-28 ENCOUNTER — Ambulatory Visit (INDEPENDENT_AMBULATORY_CARE_PROVIDER_SITE_OTHER): Payer: Medicare Other | Admitting: *Deleted

## 2019-07-28 ENCOUNTER — Ambulatory Visit (INDEPENDENT_AMBULATORY_CARE_PROVIDER_SITE_OTHER)
Admission: RE | Admit: 2019-07-28 | Discharge: 2019-07-28 | Disposition: A | Discharge status: Home / Self Care | Payer: Medicare Other | Source: Ambulatory Visit | Attending: Internal Medicine | Admitting: Internal Medicine

## 2019-07-28 VITALS — BP 130/78 | HR 70 | Ht 66.0 in | Wt 172.0 lb

## 2019-07-28 VITALS — BP 130/78 | HR 70 | Temp 98.4°F | Ht 66.0 in | Wt 172.0 lb

## 2019-07-28 DIAGNOSIS — I5032 Chronic diastolic (congestive) heart failure: Secondary | ICD-10-CM | POA: Diagnosis not present

## 2019-07-28 DIAGNOSIS — E2839 Other primary ovarian failure: Secondary | ICD-10-CM

## 2019-07-28 DIAGNOSIS — Z23 Encounter for immunization: Secondary | ICD-10-CM

## 2019-07-28 DIAGNOSIS — Z0000 Encounter for general adult medical examination without abnormal findings: Secondary | ICD-10-CM | POA: Diagnosis not present

## 2019-07-28 DIAGNOSIS — I10 Essential (primary) hypertension: Secondary | ICD-10-CM | POA: Diagnosis not present

## 2019-07-28 NOTE — Progress Notes (Signed)
Subjective:   Jennifer Huang is a 67 y.o. female who presents for Medicare Annual (Subsequent) preventive examination.  Review of Systems:   Cardiac Risk Factors include: advanced age (>31men, >45 women);dyslipidemia;hypertension Sleep patterns: feels rested on waking, gets up 1-2 times nightly to void and sleeps 7-8 hours nightly.    Home Safety/Smoke Alarms: Feels safe in home. Smoke alarms in place.  Living environment; residence and Firearm Safety: split level / walkout. Lives alone, no needs for DME, limited support system Seat Belt Safety/Bike Helmet: Wears seat belt.     Objective:     Vitals: There were no vitals taken for this visit.  There is no height or weight on file to calculate BMI.  Advanced Directives 07/28/2019 07/25/2018 02/03/2017 10/24/2016 09/24/2015  Does Patient Have a Medical Advance Directive? No No No No No  Would patient like information on creating a medical advance directive? Yes (ED - Information included in AVS) Yes (ED - Information included in AVS) No - Patient declined - -    Tobacco Social History   Tobacco Use  Smoking Status Never Smoker  Smokeless Tobacco Never Used     Counseling given: Not Answered  Past Medical History:  Diagnosis Date  . Allergy    year around allergies  . Chronic diastolic (congestive) heart failure ([place]) 05/10/2017  . Colon polyps    anal polyp  . Gout, unspecified   . Hyperlipidemia   . Primary localized osteoarthrosis, lower leg   . Unspecified essential hypertension   . Unspecified glaucoma(365.9)    Left Eye   Past Surgical History:  Procedure Laterality Date  . COLON SURGERY     anal polyp excised gen'l anesthesia  . COLONOSCOPY  10-14-2010   jacobs-hx TA 2006  . ENUCLEATION     OD 2nd to Glaucoma  . OOPHORECTOMY     Had Blockage  . SALPINGECTOMY     Had Blockage  . TOTAL ABDOMINAL HYSTERECTOMY  2002   Family History  Problem Relation Age of Onset  . Osteoarthritis Mother   . Hypertension  Mother   . Emphysema Father   . Diabetes Father   . Hypertension Father   . Arthritis Sister        hip replacments, wrist surgery  . Hyperlipidemia Sister   . Hypertension Sister   . Diabetes Brother   . Hypertension Brother   . Hyperlipidemia Brother   . Cancer - Other Brother   . Heart disease Brother   . Hypertension Brother   . Hyperlipidemia Brother   . Diabetes Brother   . Colon polyps Brother   . Diabetes Brother   . Heart disease Brother   . Hypertension Brother   . Hyperlipidemia Brother   . Colon polyps Brother   . Esophageal cancer Neg Hx   . Rectal cancer Neg Hx   . Stomach cancer Neg Hx    Social History   Socioeconomic History  . Marital status: Widowed    Spouse name: Not on file  . Number of children: 0  . Years of education: Not on file  . Highest education level: Not on file  Occupational History  . 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 Financial resource strain: Not hard at all  . Food insecurity    Worry: Never true    Inability: Never true  . Transportation needs    Medical: No    Non-medical: No  Tobacco Use  . Smoking status: Never Smoker  . Smokeless tobacco: Never Used  Substance and Sexual Activity  . Alcohol use: No    Alcohol/week: 0.0 standard drinks  . Drug use: No  . Sexual activity: Never    Partners: Male  Lifestyle  . Physical activity    Days per week: 0 days    Minutes per session: 0 min  . Stress: Not at all  Relationships  . Social connections    Talks on phone: More than three times a week    Gets together: More than three times a week    Attends religious service: More than 4 times per year    Active member of club or organization: Yes    Attends meetings of clubs or organizations: More than 4 times per year    Relationship status: Widowed  Other Topics Concern  . Not on file  Social History Narrative   HSG, @ year college. Married '73. No children.  On disability - orthopedic: knee, back, shortened leg.. SO - self-employed.      Dec '12 - many stressors: husband with cancer, brother with pancreatic cancer, housing and financial issues.              Outpatient Encounter Medications as of 07/28/2019  Medication Sig  . amLODipine (NORVASC) 10 MG tablet Take 1 tablet (10 mg total) by mouth daily.  . Azelastine HCl 0.15 % SOLN Place 2 sprays into the nose every 12 (twelve) hours as needed.  . brimonidine (ALPHAGAN P) 0.1 % SOLN 3 (three) times daily.    Marland Kitchen CALCIUM PO Take by mouth.  . dorzolamide-timolol (COSOPT) 22.3-6.8 MG/ML ophthalmic solution 1 drop 2 (two) times daily.    . furosemide (LASIX) 20 MG tablet TAKE 1 TABLET BY MOUTH EVERY DAY IF NEEDED  . lidocaine (LIDODERM) 5 % Place 1 patch onto the skin daily. Remove & Discard patch within 12 hours or as directed by MD  . meloxicam (MOBIC) 15 MG tablet Take 1 tablet (15 mg total) by mouth daily as needed for pain.  . methocarbamol (ROBAXIN) 500 MG tablet Take 0.5-1 tablets (250-500 mg total) by mouth every 8 (eight) hours as needed for muscle spasms.  . montelukast (SINGULAIR) 10 MG tablet Take 10 mg by mouth every evening.  . naproxen (NAPROSYN) 500 MG tablet Take 1 tablet (500 mg total) by mouth 2 (two) times daily as needed.  Marland Kitchen RHOPRESSA 0.02 % SOLN   . tapentadol HCl (NUCYNTA) 75 MG tablet Take 1 tablet (75 mg total) by mouth daily as needed.  Marland Kitchen VYZULTA 0.024 % SOLN instill 1 drop at bedtime into left eye   No facility-administered encounter medications on file as of 07/28/2019.     Activities of Daily Living In your present state of health, do you have any difficulty performing the following activities: 07/28/2019  Hearing? N  Vision? N  Difficulty concentrating or making decisions? N  Walking or climbing stairs? N  Dressing or bathing? N  Doing errands, shopping? N  Preparing Food and eating ? N  Using the Toilet? N  In the past six months, have you accidently leaked  urine? N  Do you have problems with loss of bowel control? N  Managing your Medications? N  Managing your Finances? N  Housekeeping or managing your Housekeeping? N  Some recent data might be hidden    Patient Care Team: Hoyt Koch, MD as PCP - General (Internal Medicine) Ubaldo Glassing Marny Lowenstein, MD (  Inactive) (Obstetrics and Gynecology) Marily Memos, MD (Orthopedic Surgery) Marylynn Pearson, MD (Ophthalmology) Mosetta Anis, MD (Allergy) Rulon Sera, MD (Rehabilitation) Golden Circle, MD as Referring Physician (Ophthalmology) Leta Baptist, MD as Consulting Physician (Otolaryngology) Golden Circle, MD as Referring Physician (Ophthalmology)    Assessment:   This is a routine wellness examination for [facility]. Physical assessment deferred to PCP.  Exercise Activities and Dietary recommendations Current Exercise Habits: The patient does not participate in regular exercise at present, Exercise limited by: orthopedic condition(s) Diet (meal preparation, eat out, water intake, caffeinated beverages, dairy products, fruits and vegetables): in general, a "healthy" diet  , well balanced. eats a variety of fruits and vegetables daily, limits salt, fat/cholesterol, sugar,carbohydrates,caffeine, drinks 6-8 glasses of water daily.  Goals    . Patient Stated     I want to start to set my alarm so I will not forget to take my glaucoma eye drops. Continue to stay in touch with my friends and church family.       Fall Risk Fall Risk  07/28/2019 07/25/2018 07/24/2017 04/28/2016  Falls in the past year? 1 Yes Yes Yes  Number falls in past yr: 0 1 1 1   Injury with Fall? 1 Yes Yes Yes  Risk for fall due to : - Impaired mobility - -  Follow up - Falls prevention discussed;Education provided - -   Is the patient's home free of loose throw rugs in walkways, pet beds, electrical cords, etc?   yes      Grab bars in the bathroom? yes      Handrails on the stairs?   yes      Adequate  lighting?   yes   Depression Screen PHQ 2/9 Scores 07/28/2019 07/25/2018 07/24/2017 04/28/2016  PHQ - 2 Score 0 1 0 0  PHQ- 9 Score - 3 - -     Cognitive Function MMSE - Mini Mental State Exam 07/25/2018  Orientation to time 5  Orientation to Place 5  Registration 3  Attention/ Calculation 5  Recall 1  Language- name 2 objects 2  Language- repeat 1  Language- follow 3 step command 3  Language- read & follow direction 1  Write a sentence 1  Copy design 1  Total score 28     6CIT Screen 07/28/2019  What Year? 0 points  What month? 0 points  What time? 0 points  Count back from 20 0 points  Months in reverse 0 points  Repeat phrase 2 points  Total Score 2    Immunization History  Administered Date(s) Administered  . Fluad Quad(high Dose 65+) 07/28/2019  . Influenza Whole 11/09/2009  . Influenza, High Dose Seasonal PF 08/31/2016, 07/24/2017, 07/25/2018  . Influenza, Seasonal, Injecte, Preservative Fre 10/17/2012  . Influenza,inj,Quad PF,6+ Mos 07/10/2014  . Pneumococcal Conjugate-13 07/24/2017  . Pneumococcal Polysaccharide-23 03/28/2016  . Tetanus 10/17/2012    Screening Tests Health Maintenance  Topic Date Due  . COLONOSCOPY  09/30/2020  . MAMMOGRAM  10/02/2020  . TETANUS/TDAP  10/17/2022  . INFLUENZA VACCINE  Completed  . DEXA SCAN  Completed  . Hepatitis C Screening  Completed  . PNA vac Low Risk Adult  Completed      Plan:    Reviewed health maintenance screenings with patient today and relevant education, vaccines, and/or referrals were provided.   I have personally reviewed and noted the following in the patient's chart:   . Medical and social history . Use of alcohol, tobacco or illicit drugs  .  Current medications and supplements . Functional ability and status . Nutritional status . Physical activity . Advanced directives . List of other physicians . Vitals . Screenings to include cognitive, depression, and falls . Referrals and appointments   In addition, I have reviewed and discussed with patient certain preventive protocols, quality metrics, and best practice recommendations. A written personalized care plan for preventive services as well as general preventive health recommendations were provided to patient.     Michiel Cowboy, RN  07/28/2019

## 2019-07-28 NOTE — Patient Instructions (Addendum)
We will check the bone density.    Health Maintenance, Female Adopting a healthy lifestyle and getting preventive care are important in promoting health and wellness. Ask your health care provider about:  The right schedule for you to have regular tests and exams.  Things you can do on your own to prevent diseases and keep yourself healthy. What should I know about diet, weight, and exercise? Eat a healthy diet   Eat a diet that includes plenty of vegetables, fruits, low-fat dairy products, and lean protein.  Do not eat a lot of foods that are high in solid fats, added sugars, or sodium. Maintain a healthy weight Body mass index (BMI) is used to identify weight problems. It estimates body fat based on height and weight. Your health care provider can help determine your BMI and help you achieve or maintain a healthy weight. Get regular exercise Get regular exercise. This is one of the most important things you can do for your health. Most adults should:  Exercise for at least 150 minutes each week. The exercise should increase your heart rate and make you sweat (moderate-intensity exercise).  Do strengthening exercises at least twice a week. This is in addition to the moderate-intensity exercise.  Spend less time sitting. Even light physical activity can be beneficial. Watch cholesterol and blood lipids Have your blood tested for lipids and cholesterol at 68 years of age, then have this test every 5 years. Have your cholesterol levels checked more often if:  Your lipid or cholesterol levels are high.  You are older than 68 years of age.  You are at high risk for heart disease. What should I know about cancer screening? Depending on your health history and family history, you may need to have cancer screening at various ages. This may include screening for:  Breast cancer.  Cervical cancer.  Colorectal cancer.  Skin cancer.  Lung cancer. What should I know about heart  disease, diabetes, and high blood pressure? Blood pressure and heart disease  High blood pressure causes heart disease and increases the risk of stroke. This is more likely to develop in people who have high blood pressure readings, are of African descent, or are overweight.  Have your blood pressure checked: ? Every 3-5 years if you are 1-26 years of age. ? Every year if you are 68 years old or older. Diabetes Have regular diabetes screenings. This checks your fasting blood sugar level. Have the screening done:  Once every three years after age 68 if you are at a normal weight and have a low risk for diabetes.  More often and at a younger age if you are overweight or have a high risk for diabetes. What should I know about preventing infection? Hepatitis B If you have a higher risk for hepatitis B, you should be screened for this virus. Talk with your health care provider to find out if you are at risk for hepatitis B infection. Hepatitis C Testing is recommended for:  Everyone born from [birth year] through 1965.  Anyone with known risk factors for hepatitis C. Sexually transmitted infections (STIs)  Get screened for STIs, including gonorrhea and chlamydia, if: ? You are sexually active and are younger than 68 years of age. ? You are older than 68 years of age and your health care provider tells you that you are at risk for this type of infection. ? Your sexual activity has changed since you were last screened, and you are at increased risk for  chlamydia or gonorrhea. Ask your health care provider if you are at risk.  Ask your health care provider about whether you are at high risk for HIV. Your health care provider may recommend a prescription medicine to help prevent HIV infection. If you choose to take medicine to prevent HIV, you should first get tested for HIV. You should then be tested every 3 months for as long as you are taking the medicine. Pregnancy  If you are about to stop  having your period (premenopausal) and you may become pregnant, seek counseling before you get pregnant.  Take 400 to 800 micrograms (mcg) of folic acid every day if you become pregnant.  Ask for birth control (contraception) if you want to prevent pregnancy. Osteoporosis and menopause Osteoporosis is a disease in which the bones lose minerals and strength with aging. This can result in bone fractures. If you are 68 years old or older, or if you are at risk for osteoporosis and fractures, ask your health care provider if you should:  Be screened for bone loss.  Take a calcium or vitamin D supplement to lower your risk of fractures.  Be given hormone replacement therapy (HRT) to treat symptoms of menopause. Follow these instructions at home: Lifestyle  Do not use any products that contain nicotine or tobacco, such as cigarettes, e-cigarettes, and chewing tobacco. If you need help quitting, ask your health care provider.  Do not use street drugs.  Do not share needles.  Ask your health care provider for help if you need support or information about quitting drugs. Alcohol use  Do not drink alcohol if: ? Your health care provider tells you not to drink. ? You are pregnant, may be pregnant, or are planning to become pregnant.  If you drink alcohol: ? Limit how much you use to 0-1 drink a day. ? Limit intake if you are breastfeeding.  Be aware of how much alcohol is in your drink. In the [place] one drink equals one 12 oz bottle of beer (355 mL), one 5 oz glass of wine (148 mL), or one 1 oz glass of hard liquor (44 mL). General instructions  Schedule regular health, dental, and eye exams.  Stay current with your vaccines.  Tell your health care provider if: ? You often feel depressed. ? You have ever been abused or do not feel safe at home. Summary  Adopting a healthy lifestyle and getting preventive care are important in promoting health and wellness.  Follow your health  care provider's instructions about healthy diet, exercising, and getting tested or screened for diseases.  Follow your health care provider's instructions on monitoring your cholesterol and blood pressure. This information is not intended to replace advice given to you by your health care provider. Make sure you discuss any questions you have with your health care provider. Document Released: 04/24/2011 Document Revised: 10/02/2018 Document Reviewed: 10/02/2018 Elsevier Patient Education  2020 Elsevier Inc.  

## 2019-07-28 NOTE — Patient Instructions (Addendum)
The [facility] provides information and referral services to aging adults 65+ in [place]. If there are waiting lists for community services, or if services are not available, [facility] connects clients with [facility] volunteers close to them who provide services such as respite care, wheelchair ramp construction, friendly visits, and transportation assistance. [facility]'s Call Center fields more than 350 calls each month.  AAIRS*- and SHIIP*-certified Call Center Specialists are ready to lend a compassionate ear and seek resources Monday through Friday, 9:00 am - 5:00 pm. The Call Center has met the needs of aging adults in all of [place] 100 counties. No religious affiliation is required; the only eligibility criterion is that clients be 65+ or older. Contact [facility] for help. *[facility] Information [facility] Referral [facility] *Seniors' Health Insurance Information Program Need help? Call [phone] today!  Continue to eat heart healthy diet (full of fruits, vegetables, whole grains, lean protein, water--limit salt, fat, and sugar intake) and increase physical activity as tolerated.  Continue doing brain stimulating activities (puzzles, reading, adult coloring books, staying active) to keep memory sharp.    Jennifer Huang , Thank you for taking time to come for your Medicare Wellness Visit. I appreciate your ongoing commitment to your health goals. Please review the following plan we discussed and let me know if I can assist you in the future.   These are the goals we discussed: Goals    . Patient Stated     I want to start to set my alarm so I will not forget to take my glaucoma eye drops. Continue to stay in touch with my friends and church family.       This is a list of the screening recommended for you and due dates:  Health Maintenance  Topic Date Due  . Colon Cancer Screening  09/30/2020  . Mammogram  10/02/2020  . Tetanus Vaccine  10/17/2022  . Flu Shot  Completed  . DEXA scan  (bone density measurement)  Completed  .  Hepatitis C: One time screening is recommended by Center for Disease Control  (CDC) for  adults born from [birth year] through 1965.   Completed  . Pneumonia vaccines  Completed    Preventive Care 55 Years and Older, Female Preventive care refers to lifestyle choices and visits with your health care provider that can promote health and wellness. This includes:  A yearly physical exam. This is also called an annual well check.  Regular dental and eye exams.  Immunizations.  Screening for certain conditions.  Healthy lifestyle choices, such as diet and exercise. What can I expect for my preventive care visit? Physical exam Your health care provider will check:  Height and weight. These may be used to calculate body mass index (BMI), which is a measurement that tells if you are at a healthy weight.  Heart rate and blood pressure.  Your skin for abnormal spots. Counseling Your health care provider may ask you questions about:  Alcohol, tobacco, and drug use.  Emotional well-being.  Home and relationship well-being.  Sexual activity.  Eating habits.  History of falls.  Memory and ability to understand (cognition).  Work and work Statistician.  Pregnancy and menstrual history. What immunizations do I need?  Influenza (flu) vaccine  This is recommended every year. Tetanus, diphtheria, and pertussis (Tdap) vaccine  You may need a Td booster every 10 years. Varicella (chickenpox) vaccine  You may need this vaccine if you have not already been vaccinated. Zoster (shingles) vaccine  You  may need this after age 71. Pneumococcal conjugate (PCV13) vaccine  One dose is recommended after age 65. Pneumococcal polysaccharide (PPSV23) vaccine  One dose is recommended after age 83. Measles, mumps, and rubella (MMR) vaccine  You may need at least one dose of MMR if you were born in 1957 or later. You may also need a second  dose. Meningococcal conjugate (MenACWY) vaccine  You may need this if you have certain conditions. Hepatitis A vaccine  You may need this if you have certain conditions or if you travel or work in places where you may be exposed to hepatitis A. Hepatitis B vaccine  You may need this if you have certain conditions or if you travel or work in places where you may be exposed to hepatitis B. Haemophilus influenzae type b (Hib) vaccine  You may need this if you have certain conditions. You may receive vaccines as individual doses or as more than one vaccine together in one shot (combination vaccines). Talk with your health care provider about the risks and benefits of combination vaccines. What tests do I need? Blood tests  Lipid and cholesterol levels. These may be checked every 5 years, or more frequently depending on your overall health.  Hepatitis C test.  Hepatitis B test. Screening  Lung cancer screening. You may have this screening every year starting at age 57 if you have a 30-pack-year history of smoking and currently smoke or have quit within the past 15 years.  Colorectal cancer screening. All adults should have this screening starting at age 45 and continuing until age 105. Your health care provider may recommend screening at age 46 if you are at increased risk. You will have tests every 1-10 years, depending on your results and the type of screening test.  Diabetes screening. This is done by checking your blood sugar (glucose) after you have not eaten for a while (fasting). You may have this done every 1-3 years.  Mammogram. This may be done every 1-2 years. Talk with your health care provider about how often you should have regular mammograms.  BRCA-related cancer screening. This may be done if you have a family history of breast, ovarian, tubal, or peritoneal cancers. Other tests  Sexually transmitted disease (STD) testing.  Bone density scan. This is done to screen for  osteoporosis. You may have this done starting at age 55. Follow these instructions at home: Eating and drinking  Eat a diet that includes fresh fruits and vegetables, whole grains, lean protein, and low-fat dairy products. Limit your intake of foods with high amounts of sugar, saturated fats, and salt.  Take vitamin and mineral supplements as recommended by your health care provider.  Do not drink alcohol if your health care provider tells you not to drink.  If you drink alcohol: ? Limit how much you have to 0-1 drink a day. ? Be aware of how much alcohol is in your drink. In the [place] one drink equals one 12 oz bottle of beer (355 mL), one 5 oz glass of wine (148 mL), or one 1 oz glass of hard liquor (44 mL). Lifestyle  Take daily care of your teeth and gums.  Stay active. Exercise for at least 30 minutes on 5 or more days each week.  Do not use any products that contain nicotine or tobacco, such as cigarettes, e-cigarettes, and chewing tobacco. If you need help quitting, ask your health care provider.  If you are sexually active, practice safe sex. Use a condom  or other form of protection in order to prevent STIs (sexually transmitted infections).  Talk with your health care provider about taking a low-dose aspirin or statin. What's next?  Go to your health care provider once a year for a well check visit.  Ask your health care provider how often you should have your eyes and teeth checked.  Stay up to date on all vaccines. This information is not intended to replace advice given to you by your health care provider. Make sure you discuss any questions you have with your health care provider. Document Released: 11/05/2015 Document Revised: 10/03/2018 Document Reviewed: 10/03/2018 Elsevier Patient Education  2020 [employer].      .

## 2019-07-28 NOTE — Progress Notes (Signed)
   Subjective:   Patient ID: Jennifer Huang, female    DOB: Apr 09, 1951, 68 y.o.   MRN: [id number]  HPI The patient is a 68 YO female coming in for physical.   PMH, [facility], social history reviewed and updated  Review of Systems  Constitutional: Negative.   HENT: Negative.   Eyes: Negative.   Respiratory: Negative for cough, chest tightness and shortness of breath.   Cardiovascular: Negative for chest pain, palpitations and leg swelling.  Gastrointestinal: Negative for abdominal distention, abdominal pain, constipation, diarrhea, nausea and vomiting.  Musculoskeletal: Negative.   Skin: Negative.   Neurological: Negative.   Psychiatric/Behavioral: Negative.     Objective:  Physical Exam Constitutional:      Appearance: She is well-developed.  HENT:     Head: Normocephalic and atraumatic.     Comments: Right eye artificial Neck:     Musculoskeletal: Normal range of motion.  Cardiovascular:     Rate and Rhythm: Normal rate and regular rhythm.  Pulmonary:     Effort: Pulmonary effort is normal. No respiratory distress.     Breath sounds: Normal breath sounds. No wheezing or rales.  Abdominal:     General: Bowel sounds are normal. There is no distension.     Palpations: Abdomen is soft.     Tenderness: There is no abdominal tenderness. There is no rebound.  Skin:    General: Skin is warm and dry.  Neurological:     Mental Status: She is alert and oriented to person, place, and time.     Coordination: Coordination normal.     Vitals:   07/28/19 1052  BP: 130/78  Pulse: 70  Temp: 98.4 F (36.9 C)  TempSrc: Oral  SpO2: 98%  Weight: 172 lb (78 kg)  Height: 5\' 6"  (1.676 m)    Assessment & Plan:  Flu shot given at visit

## 2019-07-28 NOTE — Progress Notes (Signed)
Medical screening examination/treatment/procedure(s) were performed by non-physician practitioner and as supervising physician I was immediately available for consultation/collaboration. I agree with above. Elizabeth A Crawford, MD 

## 2019-07-29 ENCOUNTER — Encounter: Payer: Self-pay | Admitting: Internal Medicine

## 2019-07-29 DIAGNOSIS — R1312 Dysphagia, oropharyngeal phase: Secondary | ICD-10-CM | POA: Diagnosis not present

## 2019-07-29 DIAGNOSIS — D440 Neoplasm of uncertain behavior of thyroid gland: Secondary | ICD-10-CM | POA: Diagnosis not present

## 2019-07-29 NOTE — Assessment & Plan Note (Signed)
Flu shot given. Pneumonia complete. Shingrix counseled. Tetanus due 2013. Colonoscopy due 2021. Mammogram due 2021, pap smear aged out and dexa ordered. Counseled about sun safety and mole surveillance. Counseled about the dangers of distracted driving. Given 10 year screening recommendations.

## 2019-07-29 NOTE — Assessment & Plan Note (Signed)
No flare today. Taking lasix.

## 2019-07-29 NOTE — Assessment & Plan Note (Signed)
BP at goal on amlodipine and lasix, checking CMP and adjust as needed.

## 2019-07-31 ENCOUNTER — Other Ambulatory Visit: Payer: Self-pay | Admitting: Otolaryngology

## 2019-07-31 DIAGNOSIS — E041 Nontoxic single thyroid nodule: Secondary | ICD-10-CM

## 2019-08-12 ENCOUNTER — Ambulatory Visit
Admission: RE | Admit: 2019-08-12 | Discharge: 2019-08-12 | Disposition: A | Discharge status: Home / Self Care | Payer: Medicare Other | Source: Ambulatory Visit | Attending: Otolaryngology | Admitting: Otolaryngology

## 2019-08-12 DIAGNOSIS — E041 Nontoxic single thyroid nodule: Secondary | ICD-10-CM | POA: Diagnosis not present

## 2019-08-18 ENCOUNTER — Other Ambulatory Visit (HOSPITAL_COMMUNITY): Payer: Self-pay | Admitting: Otolaryngology

## 2019-08-20 DIAGNOSIS — R1312 Dysphagia, oropharyngeal phase: Secondary | ICD-10-CM | POA: Diagnosis not present

## 2019-08-20 DIAGNOSIS — D440 Neoplasm of uncertain behavior of thyroid gland: Secondary | ICD-10-CM | POA: Diagnosis not present

## 2019-08-21 ENCOUNTER — Other Ambulatory Visit: Payer: Self-pay | Admitting: Otolaryngology

## 2019-08-21 DIAGNOSIS — E041 Nontoxic single thyroid nodule: Secondary | ICD-10-CM

## 2019-08-22 ENCOUNTER — Other Ambulatory Visit: Payer: Self-pay | Admitting: Otolaryngology

## 2019-08-24 DIAGNOSIS — E079 Disorder of thyroid, unspecified: Secondary | ICD-10-CM

## 2019-08-24 HISTORY — DX: Disorder of thyroid, unspecified: E07.9

## 2019-09-01 ENCOUNTER — Other Ambulatory Visit: Payer: Self-pay

## 2019-09-01 ENCOUNTER — Encounter (HOSPITAL_BASED_OUTPATIENT_CLINIC_OR_DEPARTMENT_OTHER): Payer: Self-pay | Admitting: *Deleted

## 2019-09-04 ENCOUNTER — Other Ambulatory Visit (HOSPITAL_COMMUNITY)
Admission: RE | Admit: 2019-09-04 | Discharge: 2019-09-04 | Disposition: A | Discharge status: Home / Self Care | Payer: Medicare Other | Source: Ambulatory Visit | Attending: Otolaryngology | Admitting: Otolaryngology

## 2019-09-04 ENCOUNTER — Encounter (HOSPITAL_BASED_OUTPATIENT_CLINIC_OR_DEPARTMENT_OTHER)
Admission: RE | Admit: 2019-09-04 | Discharge: 2019-09-04 | Disposition: A | Discharge status: Home / Self Care | Payer: Medicare Other | Source: Ambulatory Visit | Attending: Otolaryngology | Admitting: Otolaryngology

## 2019-09-04 ENCOUNTER — Other Ambulatory Visit: Payer: Self-pay

## 2019-09-04 DIAGNOSIS — Z20828 Contact with and (suspected) exposure to other viral communicable diseases: Secondary | ICD-10-CM | POA: Insufficient documentation

## 2019-09-04 DIAGNOSIS — Z01812 Encounter for preprocedural laboratory examination: Secondary | ICD-10-CM | POA: Diagnosis not present

## 2019-09-04 LAB — BASIC METABOLIC PANEL
Anion gap: 10 (ref 5–15)
BUN: 12 mg/dL (ref 8–23)
CO2: 27 mmol/L (ref 22–32)
Calcium: 9.5 mg/dL (ref 8.9–10.3)
Chloride: 102 mmol/L (ref 98–111)
Creatinine, Ser: 1.11 mg/dL — ABNORMAL HIGH (ref 0.44–1.00)
GFR calc Af Amer: 59 mL/min — ABNORMAL LOW (ref >60)
GFR calc non Af Amer: 51 mL/min — ABNORMAL LOW (ref >60)
Glucose, Bld: 154 mg/dL — ABNORMAL HIGH (ref 70–99)
Potassium: 4.5 mmol/L (ref 3.5–5.1)
Sodium: 139 mmol/L (ref 135–145)

## 2019-09-06 LAB — NOVEL CORONAVIRUS, NAA (HOSP ORDER, SEND-OUT TO REF LAB; TAT 18-24 HRS): SARS-CoV-2, NAA: NOT DETECTED

## 2019-09-08 ENCOUNTER — Encounter (HOSPITAL_BASED_OUTPATIENT_CLINIC_OR_DEPARTMENT_OTHER)
Admission: RE | Disposition: A | Discharge status: Home / Self Care | Payer: Self-pay | Source: Home / Self Care | Attending: Otolaryngology

## 2019-09-08 ENCOUNTER — Ambulatory Visit (HOSPITAL_BASED_OUTPATIENT_CLINIC_OR_DEPARTMENT_OTHER): Payer: Medicare Other | Admitting: Certified Registered"

## 2019-09-08 ENCOUNTER — Encounter (HOSPITAL_BASED_OUTPATIENT_CLINIC_OR_DEPARTMENT_OTHER): Payer: Self-pay

## 2019-09-08 ENCOUNTER — Other Ambulatory Visit: Payer: Self-pay

## 2019-09-08 ENCOUNTER — Ambulatory Visit (HOSPITAL_BASED_OUTPATIENT_CLINIC_OR_DEPARTMENT_OTHER)
Admission: RE | Admit: 2019-09-08 | Discharge: 2019-09-09 | Disposition: A | Discharge status: Home / Self Care | Payer: Medicare Other | Attending: Otolaryngology | Admitting: Otolaryngology

## 2019-09-08 DIAGNOSIS — H409 Unspecified glaucoma: Secondary | ICD-10-CM | POA: Diagnosis not present

## 2019-09-08 DIAGNOSIS — I5032 Chronic diastolic (congestive) heart failure: Secondary | ICD-10-CM | POA: Diagnosis not present

## 2019-09-08 DIAGNOSIS — E042 Nontoxic multinodular goiter: Secondary | ICD-10-CM | POA: Diagnosis not present

## 2019-09-08 DIAGNOSIS — E049 Nontoxic goiter, unspecified: Secondary | ICD-10-CM | POA: Diagnosis not present

## 2019-09-08 DIAGNOSIS — R1310 Dysphagia, unspecified: Secondary | ICD-10-CM | POA: Insufficient documentation

## 2019-09-08 DIAGNOSIS — I110 Hypertensive heart disease with heart failure: Secondary | ICD-10-CM | POA: Insufficient documentation

## 2019-09-08 DIAGNOSIS — Z9889 Other specified postprocedural states: Secondary | ICD-10-CM

## 2019-09-08 DIAGNOSIS — I509 Heart failure, unspecified: Secondary | ICD-10-CM | POA: Insufficient documentation

## 2019-09-08 DIAGNOSIS — D440 Neoplasm of uncertain behavior of thyroid gland: Secondary | ICD-10-CM | POA: Diagnosis not present

## 2019-09-08 DIAGNOSIS — E890 Postprocedural hypothyroidism: Secondary | ICD-10-CM

## 2019-09-08 HISTORY — PX: THYROIDECTOMY: SHX17

## 2019-09-08 HISTORY — DX: Disorder of thyroid, unspecified: E07.9

## 2019-09-08 HISTORY — DX: Allergy, unspecified, initial encounter: T78.40XA

## 2019-09-08 SURGERY — THYROIDECTOMY
Anesthesia: General | Site: Throat | Laterality: Right

## 2019-09-08 MED ORDER — DORZOLAMIDE HCL-TIMOLOL MAL 2-0.5 % OP SOLN
1.0000 [drp] | Freq: Two times a day (BID) | OPHTHALMIC | Status: DC
  Administered 2019-09-08: 1 [drp] via OPHTHALMIC

## 2019-09-08 MED ORDER — KCL IN DEXTROSE-NACL 20-5-0.45 MEQ/L-%-% IV SOLN
INTRAVENOUS | Status: DC
  Administered 2019-09-08: 14:00:00 via INTRAVENOUS
  Filled 2019-09-08: qty 1000

## 2019-09-08 MED ORDER — CLINDAMYCIN PHOSPHATE 900 MG/50ML IV SOLN
INTRAVENOUS | Status: AC
  Filled 2019-09-08: qty 50

## 2019-09-08 MED ORDER — OXYCODONE-ACETAMINOPHEN 5-325 MG PO TABS
1.0000 | ORAL_TABLET | ORAL | Status: DC | PRN
  Administered 2019-09-08 – 2019-09-09 (×3): 1 via ORAL
  Filled 2019-09-08 (×3): qty 1

## 2019-09-08 MED ORDER — DEXAMETHASONE SODIUM PHOSPHATE 10 MG/ML IJ SOLN
INTRAMUSCULAR | Status: DC | PRN
  Administered 2019-09-08: 10 mg via INTRAVENOUS

## 2019-09-08 MED ORDER — FENTANYL CITRATE (PF) 100 MCG/2ML IJ SOLN: 50.0000 ug | INTRAMUSCULAR | Status: DC | PRN

## 2019-09-08 MED ORDER — AMLODIPINE BESYLATE 10 MG PO TABS: 10.0000 mg | ORAL_TABLET | Freq: Every day | ORAL | Status: DC

## 2019-09-08 MED ORDER — PROMETHAZINE HCL 25 MG/ML IJ SOLN: 6.2500 mg | INTRAMUSCULAR | Status: DC | PRN

## 2019-09-08 MED ORDER — OXYCODONE HCL 5 MG/5ML PO SOLN: 5.0000 mg | Freq: Once | ORAL | Status: DC | PRN

## 2019-09-08 MED ORDER — MIDAZOLAM HCL 2 MG/2ML IJ SOLN: 1.0000 mg | INTRAMUSCULAR | Status: DC | PRN

## 2019-09-08 MED ORDER — ACETAMINOPHEN 160 MG/5ML PO SOLN: 650.0000 mg | ORAL | Status: DC | PRN

## 2019-09-08 MED ORDER — MIDAZOLAM HCL 2 MG/2ML IJ SOLN
INTRAMUSCULAR | Status: AC
  Filled 2019-09-08: qty 2

## 2019-09-08 MED ORDER — MONTELUKAST SODIUM 10 MG PO TABS
10.0000 mg | ORAL_TABLET | Freq: Every evening | ORAL | Status: DC
  Administered 2019-09-08: 10 mg via ORAL

## 2019-09-08 MED ORDER — OXYCODONE-ACETAMINOPHEN 5-325 MG PO TABS: 1.0000 | ORAL_TABLET | ORAL | 0 refills | Status: AC | PRN

## 2019-09-08 MED ORDER — BRIMONIDINE TARTRATE 0.15 % OP SOLN
1.0000 [drp] | Freq: Three times a day (TID) | OPHTHALMIC | Status: DC
  Administered 2019-09-08 (×2): 1 [drp] via OPHTHALMIC

## 2019-09-08 MED ORDER — CLINDAMYCIN HCL 300 MG PO CAPS: 300.0000 mg | ORAL_CAPSULE | Freq: Three times a day (TID) | ORAL | 0 refills | Status: AC

## 2019-09-08 MED ORDER — HYDROMORPHONE HCL 1 MG/ML IJ SOLN
0.2500 mg | INTRAMUSCULAR | Status: DC | PRN
  Administered 2019-09-08: 0.5 mg via INTRAVENOUS

## 2019-09-08 MED ORDER — PHENYLEPHRINE 40 MCG/ML (10ML) SYRINGE FOR IV PUSH (FOR BLOOD PRESSURE SUPPORT)
PREFILLED_SYRINGE | INTRAVENOUS | Status: DC | PRN
  Administered 2019-09-08: 80 ug via INTRAVENOUS

## 2019-09-08 MED ORDER — MORPHINE SULFATE (PF) 4 MG/ML IV SOLN: 2.0000 mg | INTRAVENOUS | Status: DC | PRN

## 2019-09-08 MED ORDER — ONDANSETRON HCL 4 MG/2ML IJ SOLN
INTRAMUSCULAR | Status: DC | PRN
  Administered 2019-09-08 (×2): 4 mg via INTRAVENOUS

## 2019-09-08 MED ORDER — ONDANSETRON HCL 4 MG/2ML IJ SOLN
INTRAMUSCULAR | Status: AC
  Filled 2019-09-08: qty 4

## 2019-09-08 MED ORDER — FENTANYL CITRATE (PF) 100 MCG/2ML IJ SOLN
INTRAMUSCULAR | Status: AC
  Filled 2019-09-08: qty 2

## 2019-09-08 MED ORDER — PROPOFOL 10 MG/ML IV BOLUS
INTRAVENOUS | Status: AC
  Filled 2019-09-08: qty 20

## 2019-09-08 MED ORDER — MEPERIDINE HCL 25 MG/ML IJ SOLN: 6.2500 mg | INTRAMUSCULAR | Status: DC | PRN

## 2019-09-08 MED ORDER — LATANOPROSTENE BUNOD 0.024 % OP SOLN
1.0000 [drp] | Freq: Every day | OPHTHALMIC | Status: DC
  Administered 2019-09-08: 1 [drp] via OPHTHALMIC

## 2019-09-08 MED ORDER — DEXAMETHASONE SODIUM PHOSPHATE 10 MG/ML IJ SOLN
INTRAMUSCULAR | Status: AC
  Filled 2019-09-08: qty 1

## 2019-09-08 MED ORDER — PHENYLEPHRINE 40 MCG/ML (10ML) SYRINGE FOR IV PUSH (FOR BLOOD PRESSURE SUPPORT)
PREFILLED_SYRINGE | INTRAVENOUS | Status: AC
  Filled 2019-09-08: qty 10

## 2019-09-08 MED ORDER — LACTATED RINGERS IV SOLN
INTRAVENOUS | Status: DC
  Administered 2019-09-08: 09:00:00 via INTRAVENOUS

## 2019-09-08 MED ORDER — PROPOFOL 10 MG/ML IV BOLUS
INTRAVENOUS | Status: DC | PRN
  Administered 2019-09-08: 50 mg via INTRAVENOUS
  Administered 2019-09-08: 30 mg via INTRAVENOUS
  Administered 2019-09-08: 150 mg via INTRAVENOUS

## 2019-09-08 MED ORDER — LIDOCAINE-EPINEPHRINE 1 %-1:100000 IJ SOLN
INTRAMUSCULAR | Status: DC | PRN
  Administered 2019-09-08: 3 mL

## 2019-09-08 MED ORDER — CLINDAMYCIN PHOSPHATE 900 MG/50ML IV SOLN
INTRAVENOUS | Status: DC | PRN
  Administered 2019-09-08: 900 mg via INTRAVENOUS

## 2019-09-08 MED ORDER — HYDROMORPHONE HCL 1 MG/ML IJ SOLN
INTRAMUSCULAR | Status: AC
  Filled 2019-09-08: qty 0.5

## 2019-09-08 MED ORDER — SUCCINYLCHOLINE CHLORIDE 200 MG/10ML IV SOSY
PREFILLED_SYRINGE | INTRAVENOUS | Status: DC | PRN
  Administered 2019-09-08: 120 mg via INTRAVENOUS

## 2019-09-08 MED ORDER — OXYCODONE HCL 5 MG PO TABS: 5.0000 mg | ORAL_TABLET | Freq: Once | ORAL | Status: DC | PRN

## 2019-09-08 MED ORDER — ACETAMINOPHEN 650 MG RE SUPP: 650.0000 mg | RECTAL | Status: DC | PRN

## 2019-09-08 MED ORDER — FENTANYL CITRATE (PF) 100 MCG/2ML IJ SOLN
INTRAMUSCULAR | Status: DC | PRN
  Administered 2019-09-08 (×3): 50 ug via INTRAVENOUS

## 2019-09-08 SURGICAL SUPPLY — 63 items
ADH SKN CLS APL DERMABOND .7 (GAUZE/BANDAGES/DRESSINGS) ×1
ATTRACTOMAT 16X20 MAGNETIC DRP (DRAPES) IMPLANT
BLADE CLIPPER SURG (BLADE) IMPLANT
BLADE SURG 10 STRL SS (BLADE) IMPLANT
BLADE SURG 15 STRL LF DISP TIS (BLADE) ×1 IMPLANT
BLADE SURG 15 STRL SS (BLADE) ×2
CANISTER SUCT 1200ML W/VALVE (MISCELLANEOUS) ×2 IMPLANT
CLIP VESOCCLUDE SM WIDE 6/CT (CLIP) IMPLANT
CORD BIPOLAR FORCEPS 12FT (ELECTRODE) ×2 IMPLANT
COVER BACK TABLE REUSABLE LG (DRAPES) ×2 IMPLANT
COVER MAYO STAND REUSABLE (DRAPES) ×2 IMPLANT
COVER WAND RF STERILE (DRAPES) IMPLANT
DECANTER SPIKE VIAL GLASS SM (MISCELLANEOUS) IMPLANT
DERMABOND ADVANCED (GAUZE/BANDAGES/DRESSINGS) ×1
DERMABOND ADVANCED .7 DNX12 (GAUZE/BANDAGES/DRESSINGS) ×1 IMPLANT
DRAIN CHANNEL 10F 3/8 F FF (DRAIN) ×1 IMPLANT
DRAPE U-SHAPE 76X120 STRL (DRAPES) ×2 IMPLANT
ELECT COATED BLADE 2.86 ST (ELECTRODE) ×2 IMPLANT
ELECT REM PT RETURN 9FT ADLT (ELECTROSURGICAL) ×2
ELECTRODE REM PT RTRN 9FT ADLT (ELECTROSURGICAL) ×1 IMPLANT
EVACUATOR SILICONE 100CC (DRAIN) ×1 IMPLANT
FORCEPS BIPOLAR SPETZLER 8 1.0 (NEUROSURGERY SUPPLIES) ×2 IMPLANT
GAUZE 4X4 16PLY RFD (DISPOSABLE) ×2 IMPLANT
GAUZE SPONGE 4X4 12PLY STRL LF (GAUZE/BANDAGES/DRESSINGS) IMPLANT
GLOVE BIO SURGEON STRL SZ 6.5 (GLOVE) ×2 IMPLANT
GLOVE BIO SURGEON STRL SZ7.5 (GLOVE) ×2 IMPLANT
GLOVE BIOGEL PI IND STRL 6.5 (GLOVE) IMPLANT
GLOVE BIOGEL PI INDICATOR 6.5 (GLOVE) ×1
GLOVE EXAM NITRILE MD LF STRL (GLOVE) ×1 IMPLANT
GOWN STRL REUS W/ TWL LRG LVL3 (GOWN DISPOSABLE) ×2 IMPLANT
GOWN STRL REUS W/ TWL XL LVL3 (GOWN DISPOSABLE) IMPLANT
GOWN STRL REUS W/TWL LRG LVL3 (GOWN DISPOSABLE) ×4
GOWN STRL REUS W/TWL XL LVL3 (GOWN DISPOSABLE) ×2
HEMOSTAT SURGICEL 2X14 (HEMOSTASIS) IMPLANT
NDL HYPO 25X1 1.5 SAFETY (NEEDLE) ×1 IMPLANT
NEEDLE HYPO 25X1 1.5 SAFETY (NEEDLE) ×2 IMPLANT
NS IRRIG 1000ML POUR BTL (IV SOLUTION) ×2 IMPLANT
PACK BASIN DAY SURGERY FS (CUSTOM PROCEDURE TRAY) ×2 IMPLANT
PENCIL SMOKE EVACUATOR (MISCELLANEOUS) ×2 IMPLANT
PIN SAFETY STERILE (MISCELLANEOUS) ×1 IMPLANT
PROBE NERVBE PRASS .33 (MISCELLANEOUS) ×2 IMPLANT
SHEARS HARMONIC 9CM CVD (BLADE) ×2 IMPLANT
SLEEVE SCD COMPRESS KNEE MED (MISCELLANEOUS) ×2 IMPLANT
SPONGE INTESTINAL PEANUT (DISPOSABLE) ×2 IMPLANT
STAPLER VISISTAT 35W (STAPLE) IMPLANT
SUT ETHILON 3 0 PS 1 (SUTURE) ×2 IMPLANT
SUT PROLENE 5 0 P 3 (SUTURE) IMPLANT
SUT SILK 2 0 SH (SUTURE) ×2 IMPLANT
SUT SILK 2 0 TIES 17X18 (SUTURE)
SUT SILK 2-0 18XBRD TIE BLK (SUTURE) IMPLANT
SUT SILK 3 0 TIES 17X18 (SUTURE) ×4
SUT SILK 3-0 18XBRD TIE BLK (SUTURE) ×2 IMPLANT
SUT VIC AB 3-0 FS2 27 (SUTURE) ×2 IMPLANT
SUT VICRYL 4-0 PS2 18IN ABS (SUTURE) ×3 IMPLANT
SYR BULB 3OZ (MISCELLANEOUS) ×2 IMPLANT
SYR CONTROL 10ML LL (SYRINGE) ×2 IMPLANT
TOWEL GREEN STERILE FF (TOWEL DISPOSABLE) ×4 IMPLANT
TRAY DSU PREP LF (CUSTOM PROCEDURE TRAY) ×2 IMPLANT
TUBE CONNECTING 20X1/4 (TUBING) ×2 IMPLANT
TUBE ENDOTRAC NIMS EMG 6MM (MISCELLANEOUS) IMPLANT
TUBE ENDOTRAC NIMS EMG 7MM (MISCELLANEOUS) ×1 IMPLANT
TUBE ENDOTRAC NIMS EMG 8MM (MISCELLANEOUS) IMPLANT
TUBE ENDOTRAC NIMS EMG 9MM (MISCELLANEOUS) IMPLANT

## 2019-09-08 NOTE — Op Note (Signed)
DATE OF PROCEDURE:  09/08/2019                              OPERATIVE REPORT  SURGEON:  Leta Baptist, MD  PREOPERATIVE DIAGNOSES: 1. Right thyroid nodules. 2. Dysphagia.  POSTOPERATIVE DIAGNOSES: 1. Right thyroid nodules. 2. Dysphagia.  PROCEDURE PERFORMED:  Right hemithyroidectomy  ANESTHESIA:  General endotracheal tube anesthesia.  COMPLICATIONS:  None.  ESTIMATED BLOOD LOSS:  55ml  INDICATION FOR PROCEDURE:  KRYSTYN VAQUERANO is a 68 y.o. female with a history of thyroid goiter.  The patient was complaining of dysphagia, secondary to the compressive effect of her thyroid gland.  She subsequently underwent a thyroid ultrasound.  The ultrasound showed 4 nodules that were greater than 1 cm.  3 of the large thyroid nodules are noted on the right side.  They were 2.6 cm, 3.1cm and 1.3 cm in diameter.  She also has a smaller 1.3 cm nodule within the left thyroid lobe. Over the past few months, the patient has been complaining of recurrent choking sensation.  She attributed the choking sensation to her enlarged thyroid.  She continued to have intermittent dysphagia.  Based on the above findings, the decision was made for the patient to undergo the above stated procedure. Likelihood of success in reducing symptoms was also discussed.  The risks, benefits, alternatives, and details of the procedure were discussed with the patient.  Questions were invited and answered.  Informed consent was obtained.  DESCRIPTION:  The patient was taken to the operating room and placed supine on the operating table.  General endotracheal tube anesthesia was administered by the anesthesiologist.  The patient was positioned and prepped and draped in a standard fashion for thyroidectomy.  A nerve monitoring ET tube was used.  The nerve monitoring system was functional throughout the case.  1% lidocaine with 1-100,000 epinephrine was infiltrated at the planned site of incision.  A lower neck transverse incision was made.   The incision was carried down to the level of the platysma muscles.  Superiorly based and inferiorly based subplatysmal flaps were elevated in the standard fashion.  The strap muscles were divided at midline and retracted laterally, exposing the right thyroid lobe.  Multiple thyroid nodules were noted.  Careful dissection was performed to free the right thyroid lobe from the surrounding soft tissue.  The right recurrent laryngeal nerve was identified and preserved.  A possible parathyroid gland was also identified and preserved.  The entire right thyroid lobe was resected free and sent to the pathology department for permanent histologic identification.  The surgical site was copiously irrigated.  A #10 JP drain was placed.  The incision was closed in layers with 4-0 Vicryl and Dermabond.  The care of the patient was turned over to the anesthesiologist.  The patient was awakened from anesthesia without difficulty.  The patient was extubated and transferred to the recovery room in good condition.  OPERATIVE FINDINGS: Right thyroid nodules.  SPECIMEN:  Right thyroid lobe.  FOLLOWUP CARE:  The patient will be admitted for overnight observation.  Jamason Peckham Cassie Freer 09/08/2019 12:48 PM

## 2019-09-08 NOTE — H&P (Signed)
Cc: Thyroid goiter, dysphagia  HPI: The patient is a 68 year old female who returns today for her follow-up evaluation. The patient was previously seen for her thyroid goiter.  The patient was complaining of dysphagia, secondary to the compressive effect of her thyroid gland.  She subsequently underwent a thyroid ultrasound.  The ultrasound showed 4 nodules that are greater than 1 cm.  3 of the large thyroid nodules are noted on the right side.  They are 2.6 cm, 3.1cm and 1.3 cm in diameter.  She also has a smaller 1.3 cm nodule within the left thyroid lobe.  The 2 largest nodules on the right side met the criteria for biopsy.  The patient returns today complaining of recurrent choking sensation.  She attributes the choking sensation to her enlarged thyroid.  She continues to have intermittent dysphagia.  She denies any odynophagia or dyspnea.  No other ENT, GI, or respiratory issue noted since the last visit.   Exam: General: Communicates without difficulty, well nourished, no acute distress. Head: Normocephalic, no evidence injury, no tenderness, facial buttresses intact without stepoff. Eyes: PERRL, EOMI. No scleral icterus, conjunctivae clear. Neuro: CN II exam reveals vision grossly intact.  No nystagmus at any point of gaze. Ears: Auricles well formed without lesions.  Ear canals are intact without mass or lesion.  No erythema or edema is appreciated.  The TMs are intact without fluid. Nose: External evaluation reveals normal support and skin without lesions.  Dorsum is intact.  Anterior rhinoscopy reveals mild nasal mucosal congestion over anterior aspect of inferior turbinates and intact septum.  No purulence noted. Oral:  Oral cavity and oropharynx are intact, symmetric, without erythema or edema.  Mucosa is moist without lesions. Neck: Full range of motion without pain.  There is no significant lymphadenopathy.  No masses palpable.  Thyroid bed enlarged with palpable nodules on the right.  Parotid  glands and submandibular glands equal bilaterally without mass.  Trachea is midline.   Procedure:  Flexible Fiberoptic Laryngoscopy Risks, benefits, and alternatives of flexible endoscopy were explained to the patient.  Specific mention was made of the risk of throat numbness with difficulty swallowing, possible bleeding from the nose and mouth, and pain from the procedure.  The patient gave oral consent to proceed.  The nasal cavities were decongested and anesthetised with a combination of oxymetazoline and 4% lidocaine solution.  The flexible scope was inserted into the right nasal cavity and advanced towards the nasopharynx.  Visualized mucosa over the turbinates and septum were as described above.  The nasopharynx was clear.  Oropharyngeal walls were symmetric and mobile without lesion, mass, or edema.  Hypopharynx was also without  lesion or edema.  Larynx was mobile without lesions. Supraglottic structures were free of edema, mass, and asymmetry.  True vocal folds were white without mass or lesion.  Base of tongue was within normal limits.   Assessment 1.  Multinodular thyroid goiter.  Her 3 largest thyroid nodules are on the right side.  This is causing a compressive effect. The patient has been experiencing choking sensation and dysphagia.   2.  The patient has a normal laryngoscopy examination today.  Her vocal cords are mobile bilaterally.   Plan  1.  The physical exam and laryngoscopy findings are reviewed with the patient.  The ultrasound results are also discussed.  2.  The treatment options are extensively reviewed.  The options include conservative observation, fine needle aspiration biopsy of the enlarged thyroid nodules or right hemithyroidectomy surgery.  The  risks, benefits, alternatives and details of the procedure are reviewed with the patient. Questions are invited and answered.  3.  The patient would like to proceed with right hemithyroidectomy surgery.

## 2019-09-08 NOTE — Transfer of Care (Signed)
Immediate Anesthesia Transfer of Care Note  Patient: Jennifer Huang  Procedure(s) Performed: RIGHT HEMI-THYROIDECTOMY (Right Throat)  Patient Location: PACU  Anesthesia Type:General  Level of Consciousness: drowsy  Airway & Oxygen Therapy: Patient Spontanous Breathing and Patient connected to face mask oxygen  Post-op Assessment: Report given to RN and Post -op Vital signs reviewed and stable  Post vital signs: Reviewed and stable  Last Vitals:  Vitals Value Taken Time  BP 152/73 09/08/19 1245  Temp    Pulse 60 09/08/19 1250  Resp 13 09/08/19 1250  SpO2 100 % 09/08/19 1250  Vitals shown include unvalidated device data.  Last Pain:  Vitals:   09/08/19 0831  TempSrc: Oral  PainSc: 0-No pain      Patients Stated Pain Goal: 6 ([id number]  Complications: No apparent anesthesia complications

## 2019-09-08 NOTE — Anesthesia Preprocedure Evaluation (Addendum)
Anesthesia Evaluation  Patient identified by MRN, date of birth, ID band Patient awake    Reviewed: Allergy & Precautions, NPO status , Patient's Chart, lab work & pertinent test results  Airway Mallampati: II  TM Distance: >3 FB Neck ROM: Full    Dental no notable dental hx.    Pulmonary neg pulmonary ROS,    Pulmonary exam normal breath sounds clear to auscultation       Cardiovascular hypertension, Pt. on medications +CHF  Normal cardiovascular exam Rhythm:Regular Rate:Normal     Neuro/Psych negative neurological ROS  negative psych ROS   GI/Hepatic negative GI ROS, Neg liver ROS,   Endo/Other  negative endocrine ROS  Renal/GU negative Renal ROS  negative genitourinary   Musculoskeletal  (+) Arthritis , Osteoarthritis,    Abdominal   Peds negative pediatric ROS (+)  Hematology negative hematology ROS (+)   Anesthesia Other Findings   Reproductive/Obstetrics negative OB ROS                            Anesthesia Physical Anesthesia Plan  ASA: III  Anesthesia Plan: General   Post-op Pain Management:    Induction: Intravenous  PONV Risk Score and Plan: 3 and Ondansetron, Dexamethasone, Midazolam and Treatment may vary due to age or medical condition  Airway Management Planned: Oral ETT  Additional Equipment:   Intra-op Plan:   Post-operative Plan: Extubation in OR  Informed Consent: I have reviewed the patients History and Physical, chart, labs and discussed the procedure including the risks, benefits and alternatives for the proposed anesthesia with the patient or authorized representative who has indicated his/her understanding and acceptance.     Dental advisory given  Plan Discussed with: CRNA  Anesthesia Plan Comments:         Anesthesia Quick Evaluation

## 2019-09-08 NOTE — Anesthesia Procedure Notes (Signed)
Procedure Name: Intubation Date/Time: 09/08/2019 11:12 AM Performed by: Gwyndolyn Saxon, CRNA Pre-anesthesia Checklist: Patient identified, Emergency Drugs available, Suction available and Patient being monitored Patient Re-evaluated:Patient Re-evaluated prior to induction Oxygen Delivery Method: Circle system utilized Preoxygenation: Pre-oxygenation with 100% oxygen Induction Type: IV induction Ventilation: Mask ventilation without difficulty Laryngoscope Size: Glidescope and 3 Grade View: Grade I Tube type: Oral Tube size: 7.0 mm Number of attempts: 2 Airway Equipment and Method: Patient positioned with wedge pillow and Rigid stylet Placement Confirmation: ETT inserted through vocal cords under direct vision,  positive ETCO2 and breath sounds checked- equal and bilateral Secured at: 21 cm Tube secured with: Tape Dental Injury: Teeth and Oropharynx as per pre-operative assessment  Difficulty Due To: Difficulty was anticipated Comments: Pt with anterior larynx; no view with miller 2. DL #2 with glidescope.grade 1 view.

## 2019-09-09 ENCOUNTER — Encounter (HOSPITAL_BASED_OUTPATIENT_CLINIC_OR_DEPARTMENT_OTHER): Payer: Self-pay | Admitting: Otolaryngology

## 2019-09-09 ENCOUNTER — Ambulatory Visit: Payer: Medicare Other | Admitting: Family Medicine

## 2019-09-09 DIAGNOSIS — E042 Nontoxic multinodular goiter: Secondary | ICD-10-CM | POA: Diagnosis not present

## 2019-09-09 DIAGNOSIS — R1310 Dysphagia, unspecified: Secondary | ICD-10-CM | POA: Diagnosis not present

## 2019-09-09 DIAGNOSIS — I509 Heart failure, unspecified: Secondary | ICD-10-CM | POA: Diagnosis not present

## 2019-09-09 DIAGNOSIS — I110 Hypertensive heart disease with heart failure: Secondary | ICD-10-CM | POA: Diagnosis not present

## 2019-09-09 NOTE — Anesthesia Postprocedure Evaluation (Signed)
Anesthesia Post Note  Patient: Jennifer Huang  Procedure(s) Performed: RIGHT HEMI-THYROIDECTOMY (Right Throat)     Patient location during evaluation: PACU Anesthesia Type: General Level of consciousness: awake and alert Pain management: pain level controlled Vital Signs Assessment: post-procedure vital signs reviewed and stable Respiratory status: spontaneous breathing, nonlabored ventilation and respiratory function stable Cardiovascular status: blood pressure returned to baseline and stable Postop Assessment: no apparent nausea or vomiting Anesthetic complications: no    Last Vitals:  Vitals:   09/09/19 0445 09/09/19 0530  BP: 122/64   Pulse: 61   Resp: 18   Temp: 36.7 C   SpO2: 98% 97%    Last Pain:  Vitals:   09/09/19 0530  TempSrc:   PainSc: [place]

## 2019-09-09 NOTE — Discharge Summary (Signed)
Physician Discharge Summary  Patient ID: Jennifer Huang MRN: [id number] DOB/AGE: June 02, 1951 68 y.o.  Admit date: 09/08/2019 Discharge date: 09/09/2019  Admission Diagnoses: Right thyroid nodules  Discharge Diagnoses: Right thyroid nodules Active Problems:   S/P partial thyroidectomy   Discharged Condition: good  Hospital Course: Pt had an uneventful overnight stay. Pt tolerated po well. No bleeding. No stridor. Voice is strong.  Consults: None  Significant Diagnostic Studies: None  Treatments: surgery: Right hemithyroidectomy  Discharge Exam: Blood pressure 122/64, pulse 61, temperature 98 F (36.7 C), resp. rate 18, height 5\' 6"  (1.676 m), weight 74.9 kg, SpO2 97 %. Incision/Wound:c/d/i Voice is strong  Disposition: Discharge disposition: 01-Home or Self Care       Discharge Instructions    Activity as tolerated - No restrictions   Complete by: As directed    Diet general   Complete by: As directed      Allergies as of 09/09/2019      Reactions   Penicillins    REACTION: rash/hives   Sulfonamide Derivatives    REACTION: rash/hives      Medication List    STOP taking these medications   aspirin EC 81 MG tablet     TAKE these medications   amLODipine 10 MG tablet Commonly known as: NORVASC Take 1 tablet (10 mg total) by mouth daily.   Azelastine HCl 0.15 % Soln Place 2 sprays into the nose every 12 (twelve) hours as needed.   brimonidine 0.1 % Soln Commonly known as: ALPHAGAN P 3 (three) times daily.   CALCIUM PO Take by mouth.   clindamycin 300 MG capsule Commonly known as: CLEOCIN Take 1 capsule (300 mg total) by mouth 3 (three) times daily for 3 days.   dorzolamide-timolol 22.3-6.8 MG/ML ophthalmic solution Commonly known as: COSOPT 1 drop 2 (two) times daily.   fexofenadine 180 MG tablet Commonly known as: ALLEGRA Take 180 mg by mouth daily.   furosemide 20 MG tablet Commonly known as: LASIX TAKE 1 TABLET BY MOUTH EVERY DAY  IF NEEDED   lidocaine 5 % Commonly known as: Lidoderm Place 1 patch onto the skin daily. Remove & Discard patch within 12 hours or as directed by MD   montelukast 10 MG tablet Commonly known as: SINGULAIR Take 10 mg by mouth every evening.   oxyCODONE-acetaminophen 5-325 MG tablet Commonly known as: Percocet Take 1 tablet by mouth every 4 (four) hours as needed for up to 5 days for severe pain.   Rhopressa 0.02 % Soln Generic drug: Netarsudil Dimesylate   VITAMIN C PO Take by mouth.   VITAMIN D PO Take by mouth.   Vyzulta 0.024 % Soln Generic drug: Latanoprostene Bunod instill 1 drop at bedtime into left eye      Follow-up Information    Leta Baptist, MD On 09/16/2019.   Specialty: Otolaryngology Why: at 1:50pm Contact information: [address] [place] [address] [phone]           Signed: Burley Saver 09/09/2019, 7:30 AM

## 2019-09-09 NOTE — Discharge Instructions (Signed)
Post Anesthesia Home Care Instructions  Activity: Get plenty of rest for the remainder of the day. A responsible individual must stay with you for 24 hours following the procedure.  For the next 24 hours, DO NOT: -Drive a car -Paediatric nurse -Drink alcoholic beverages -Take any medication unless instructed by your physician -Make any legal decisions or sign important papers.  Meals: Start with liquid foods such as gelatin or soup. Progress to regular foods as tolerated. Avoid greasy, spicy, heavy foods. If nausea and/or vomiting occur, drink only clear liquids until the nausea and/or vomiting subsides. Call your physician if vomiting continues.  Special Instructions/Symptoms: Your throat may feel dry or sore from the anesthesia or the breathing tube placed in your throat during surgery. If this causes discomfort, gargle with warm salt water. The discomfort should disappear within 24 hours.  If you had a scopolamine patch placed behind your ear for the management of post- operative nausea and/or vomiting:  1. The medication in the patch is effective for 72 hours, after which it should be removed.  Wrap patch in a tissue and discard in the trash. Wash hands thoroughly with soap and water. 2. You may remove the patch earlier than 72 hours if you experience unpleasant side effects which may include dry mouth, dizziness or visual disturbances. 3. Avoid touching the patch. Wash your hands with soap and water after contact with the patch.   --------------   Thyroidectomy, Care After This sheet gives you information about how to care for yourself after your procedure. Your health care provider may also give you more specific instructions. If you have problems or questions, contact your health care provider. What can I expect after the procedure? After the procedure, it is common to have:  Mild pain in the neck or upper body, especially when swallowing.  A swollen neck.  A sore  throat.  A weak or hoarse voice.  Slight tingling or numbness around your mouth, or in your fingers or toes. This may last for a day or two after surgery. This condition is caused by low levels of calcium. You may be given calcium supplements to treat it. Follow these instructions at home:  Medicines  Take over-the-counter and prescription medicines only as told by your health care provider.  Do not drive or use heavy machinery while taking prescription pain medicine.  Do not take medicines that contain aspirin and ibuprofen until your health care provider says that you can. These medicines can increase your risk of bleeding. Eating and drinking  Start slowly with eating. You may need to have only liquids and soft foods for a few days or as directed by your health care provider.  To prevent or treat constipation while you are taking prescription pain medicine, your health care provider may recommend that you: ? Drink enough fluid to keep your urine pale yellow. ? Take over-the-counter or prescription medicines. ? Eat foods that are high in fiber, such as fresh fruits and vegetables, whole grains, and beans. ? Limit foods that are high in fat and processed sugars, such as fried and sweet foods. Incision care  Follow instructions from your health care provider about how to take care of your incision. Make sure you: ? Wash your hands with soap and water before you change your bandage (dressing). If soap and water are not available, use hand sanitizer. ? Leave stitches (sutures), skin glue, or adhesive strips in place. These skin closures may need to stay in place for 2  weeks or longer. If adhesive strip edges start to loosen and curl up, you may trim the loose edges. Do not remove adhesive strips completely unless your health care provider tells you to do that.  Check your incision area every day for signs of infection. Check for: ? Redness, swelling, or pain. ? Fluid or  blood. ? Warmth. ? Pus or a bad smell. Activity  For the first 10 days after the procedure or as instructed by your health care provider: ? Do not lift anything that is heavier than 10 lb (4.5 kg). ? Do not jog, swim, or do other strenuous exercises. ? Do not play contact sports.  Avoid sitting for a long time without moving. Get up to take short walks every 1-2 hours. This is needed to improve blood flow and breathing. Ask for help if you feel weak or unsteady.  Return to your normal activities as told by your health care provider. Ask your health care provider what activities are safe for you. General instructions  Do not use any products that contain nicotine or tobacco, such as cigarettes and e-cigarettes. These can delay healing after surgery. If you need help quitting, ask your health care provider.  Keep all follow-up visits as told by your health care provider. This is important. Your health care provider needs to monitor the calcium level in your blood to make sure that it does not become low. Contact a health care provider if you:  Have a fever.  Have more redness, swelling, or pain around your incision area.  Have fluid or blood coming from your incision area.  Notice that your incision area feels warm to the touch.  Have pus or a bad smell coming from your incision area.  Have trouble talking.  Have nausea or vomiting for more than 2 days. Get help right away if you:  Have trouble breathing.  Have trouble swallowing.  Develop a rash.  Develop a cough that gets worse.  Notice that your speech changes, or you have hoarseness that gets worse.  Develop numbness, tingling, or muscle spasms in the arms, hands, feet, or face. Summary  After the procedure, it is common to feel mild pain in the neck or upper body, especially when swallowing.  Take medicines as told by your health care provider. These include pain medicines and thyroid hormones, if  required.  Follow instructions from your health care provider about how to take care of your incision. Watch for signs of infection.  Keep all follow-up visits as told by your health care provider. This is important. Your health care provider needs to monitor the calcium level in your blood to make sure that it does not become low.  Get help right away if you develop difficulty breathing, or numbness, tingling, or muscle spasms in the arms, hands, feet, or face. This information is not intended to replace advice given to you by your health care provider. Make sure you discuss any questions you have with your health care provider. Document Released: 04/28/2005 Document Revised: 12/05/2018 Document Reviewed: 08/14/2017 Elsevier Patient Education  2020 [employer].

## 2019-09-10 LAB — SURGICAL PATHOLOGY

## 2019-10-07 DIAGNOSIS — Z803 Family history of malignant neoplasm of breast: Secondary | ICD-10-CM | POA: Diagnosis not present

## 2019-10-07 DIAGNOSIS — Z1231 Encounter for screening mammogram for malignant neoplasm of breast: Secondary | ICD-10-CM | POA: Diagnosis not present

## 2019-10-07 LAB — HM MAMMOGRAPHY

## 2019-10-08 ENCOUNTER — Encounter: Payer: Self-pay | Admitting: Internal Medicine

## 2019-10-15 DIAGNOSIS — H401122 Primary open-angle glaucoma, left eye, moderate stage: Secondary | ICD-10-CM | POA: Diagnosis not present

## 2019-11-25 ENCOUNTER — Telehealth: Payer: Self-pay | Admitting: Internal Medicine

## 2019-11-25 DIAGNOSIS — I10 Essential (primary) hypertension: Secondary | ICD-10-CM

## 2019-11-25 MED ORDER — AMLODIPINE BESYLATE 10 MG PO TABS: 10.0000 mg | ORAL_TABLET | Freq: Every day | ORAL | 1 refills | Status: DC

## 2019-11-25 MED ORDER — FUROSEMIDE 20 MG PO TABS: ORAL_TABLET | ORAL | 1 refills | Status: DC

## 2019-11-25 NOTE — Telephone Encounter (Signed)
    Patient wants to know if she should get the COVID vaccine

## 2019-11-25 NOTE — Telephone Encounter (Signed)
Pt has been informed to proceed with vaccination.

## 2019-11-25 NOTE — Telephone Encounter (Signed)
      1. Which medications need to be refilled? (please list name of each medication and dose if known)  amLODipine (NORVASC) 10 MG tablet furosemide (LASIX) 20 MG tablet  2. Which pharmacy/location (including street and city if local pharmacy) is medication to be sent to? [facility]  3. Do they need a 30 day or 90 day supply? [place]

## 2019-12-05 ENCOUNTER — Ambulatory Visit: Payer: Medicare Other | Attending: Internal Medicine

## 2019-12-05 ENCOUNTER — Other Ambulatory Visit: Payer: Self-pay

## 2019-12-05 DIAGNOSIS — Z23 Encounter for immunization: Secondary | ICD-10-CM | POA: Insufficient documentation

## 2019-12-05 NOTE — Progress Notes (Signed)
   Covid-19 Vaccination Clinic  Name:  Jennifer Huang    MRN: [id number] DOB: [birth year]  12/05/2019  Jennifer Huang was observed post Covid-19 immunization for 15 minutes without incidence. She was provided with Vaccine Information Sheet and instruction to access the V-Safe system.   Jennifer Huang was instructed to call 911 with any severe reactions post vaccine: Marland Kitchen Difficulty breathing  . Swelling of your face and throat  . A fast heartbeat  . A bad rash all over your body  . Dizziness and weakness

## 2020-01-05 ENCOUNTER — Ambulatory Visit: Payer: Medicare Other | Attending: Internal Medicine

## 2020-01-05 DIAGNOSIS — Z23 Encounter for immunization: Secondary | ICD-10-CM

## 2020-01-05 NOTE — Progress Notes (Signed)
   Covid-19 Vaccination Clinic  Name:  Jennifer Huang    MRN: [id number] DOB: [birth year]  01/05/2020  [name] was observed post Covid-19 immunization for 15 minutes without incident. She was provided with Vaccine Information Sheet and instruction to access the V-Safe system.   [name] was instructed to call 911 with any severe reactions post vaccine: Marland Kitchen Difficulty breathing  . Swelling of face and throat  . A fast heartbeat  . A bad rash all over body  . Dizziness and weakness   Immunizations Administered    Name Date Dose VIS Date Route   Moderna COVID-19 Vaccine 01/05/2020 11:07 AM 0.5 mL 09/23/2019 Intramuscular   Manufacturer: Moderna   Lot: [id number]   [id number]

## 2020-01-30 DIAGNOSIS — H1013 Acute atopic conjunctivitis, bilateral: Secondary | ICD-10-CM | POA: Diagnosis not present

## 2020-01-30 DIAGNOSIS — H401122 Primary open-angle glaucoma, left eye, moderate stage: Secondary | ICD-10-CM | POA: Diagnosis not present

## 2020-02-02 ENCOUNTER — Ambulatory Visit: Payer: Medicare Other | Admitting: Family Medicine

## 2020-02-02 ENCOUNTER — Other Ambulatory Visit: Payer: Self-pay

## 2020-02-02 ENCOUNTER — Encounter: Payer: Self-pay | Admitting: Family Medicine

## 2020-02-02 DIAGNOSIS — M1712 Unilateral primary osteoarthritis, left knee: Secondary | ICD-10-CM

## 2020-02-02 NOTE — Progress Notes (Signed)
[facility] [address] [place] Phone: [phone] Subjective:   I Jennifer Huang am serving as a Education administrator for Dr. Hulan Saas.  This visit occurred during the SARS-CoV-2 public health emergency.  Safety protocols were in place, including screening questions prior to the visit, additional usage of staff PPE, and extensive cleaning of exam room while observing appropriate contact time as indicated for disinfecting solutions.   I'm seeing this patient by the request  of:  Hoyt Koch, MD  CC: left knee pain   [id number]   07/10/2019 Patient is able to bear weight and do all daily activities without any significant difficulty.  Patient has underlying arthritic changes of multiple other joints that likely could be contributing to some of the abnormality in the gait.  Patient as long as it is well can follow-up with me again in 3 months.  Worsening symptoms will need to consider advanced imaging but I think patient will do well at this time  Update 02/02/2020 Jennifer Huang is a 69 y.o. female coming in with complaint of left knee pain. States the knee is unstable. Patient states the knee is stiff. Would like a steroid injection in the knee. Pain at night that wakes her up. Knee swells at times. Pops and crack.   Onset- Chronic  Location- lateral and medial, posterior  Duration-  Character- "quick give out pain" Aggravating factors- flexion, stairs  Reliving factors-  Therapies tried- ice, heat, topical, oral  Severity-  5/10 at its worse      Past Medical History:  Diagnosis Date  . Allergies   . Allergy    year around allergies  . Chronic diastolic (congestive) heart failure ([place]) 05/10/2017  . Colon polyps    anal polyp  . Gout, unspecified   . Hyperlipidemia   . Primary localized osteoarthrosis, lower leg   . Thyroid mass    right  . Unspecified essential hypertension   . Unspecified glaucoma(365.9)    Left  Eye   Past Surgical History:  Procedure Laterality Date  . COLON SURGERY     anal polyp excised gen'l anesthesia  . COLONOSCOPY  10-14-2010   jacobs-hx TA 2006  . ENUCLEATION     OD 2nd to Glaucoma  . OOPHORECTOMY     Had Blockage  . SALPINGECTOMY     Had Blockage  . THYROIDECTOMY Right 09/08/2019   Procedure: RIGHT HEMI-THYROIDECTOMY;  Surgeon: Leta Baptist, MD;  Location: [facility];  Service: ENT;  Laterality: Right;  . TOTAL ABDOMINAL HYSTERECTOMY  2002   Social History   Socioeconomic History  . Marital status: Widowed    Spouse name: Not on file  . Number of children: 0  . Years of education: Not on file  . Highest education level: Not on file  Occupational History  . 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 Smoking status: Never Smoker  . Smokeless tobacco: Never Used  Substance and Sexual Activity  . Alcohol use: No    Alcohol/week: 0.0 standard drinks  . Drug use: No  . Sexual activity: Not Currently    Partners: Male    Birth control/protection: Surgical  Other Topics Concern  . Not on file  Social History Narrative   HSG, @ year college. Married '73. No children. On disability - orthopedic: knee, back, shortened leg.. SO - self-employed.      Dec '12 -  many stressors: husband with cancer, brother with pancreatic cancer, housing and financial issues.             Social Determinants of Health   Financial Resource Strain:   . Difficulty of Paying Living Expenses:   Food Insecurity:   . Worried About Charity fundraiser in the Last Year:   . Arboriculturist in the Last Year:   Transportation Needs:   . Film/video editor (Medical):   Marland Kitchen Lack of Transportation (Non-Medical):   Physical Activity: Inactive  . Days of Exercise per Week: 0 days  . Minutes of Exercise per Session: 0 min  Stress:   . Feeling of Stress :   Social Connections:   . Frequency of Communication with Friends  and Family:   . Frequency of Social Gatherings with Friends and Family:   . Attends Religious Services:   . Active Member of Clubs or Organizations:   . Attends Archivist Meetings:   Marland Kitchen Marital Status:    Allergies  Allergen Reactions  . Penicillins     REACTION: rash/hives  . Sulfonamide Derivatives     REACTION: rash/hives   Family History  Problem Relation Age of Onset  . Osteoarthritis Mother   . Hypertension Mother   . Emphysema Father   . Diabetes Father   . Hypertension Father   . Arthritis Sister        hip replacments, wrist surgery  . Hyperlipidemia Sister   . Hypertension Sister   . Diabetes Brother   . Hypertension Brother   . Hyperlipidemia Brother   . Cancer - Other Brother   . Heart disease Brother   . Hypertension Brother   . Hyperlipidemia Brother   . Diabetes Brother   . Colon polyps Brother   . Diabetes Brother   . Heart disease Brother   . Hypertension Brother   . Hyperlipidemia Brother   . Colon polyps Brother   . Esophageal cancer Neg Hx   . Rectal cancer Neg Hx   . Stomach cancer Neg Hx      Current Outpatient Medications (Cardiovascular):  .  amLODipine (NORVASC) 10 MG tablet, Take 1 tablet (10 mg total) by mouth daily. .  furosemide (LASIX) 20 MG tablet, TAKE 1 TABLET BY MOUTH EVERY DAY IF NEEDED  Current Outpatient Medications (Respiratory):  Marland Kitchen  Azelastine HCl 0.15 % SOLN, Place 2 sprays into the nose every 12 (twelve) hours as needed. .  fexofenadine (ALLEGRA) 180 MG tablet, Take 180 mg by mouth daily. .  montelukast (SINGULAIR) 10 MG tablet, Take 10 mg by mouth every evening.    Current Outpatient Medications (Other):  Marland Kitchen  Ascorbic Acid (VITAMIN C PO), Take by mouth. .  brimonidine (ALPHAGAN P) 0.1 % SOLN, 3 (three) times daily.   Marland Kitchen  CALCIUM PO, Take by mouth. .  dorzolamide-timolol (COSOPT) 22.3-6.8 MG/ML ophthalmic solution, 1 drop 2 (two) times daily.   Marland Kitchen  lidocaine (LIDODERM) 5 %, Place 1 patch onto the skin daily.  Remove & Discard patch within 12 hours or as directed by MD .  RHOPRESSA 0.02 % SOLN,  .  VITAMIN D PO, Take by mouth. .  VYZULTA 0.024 % SOLN, instill 1 drop at bedtime into left eye   Reviewed prior external information including notes and imaging from  primary care provider As well as notes that were available from care everywhere and other healthcare systems.  Past medical history, social, surgical and family history all reviewed  in electronic medical record.  No pertanent information unless stated regarding to the chief complaint.   Review of Systems:  No headache, visual changes, nausea, vomiting, diarrhea, constipation, dizziness, abdominal pain, skin rash, fevers, chills, night sweats, weight loss, swollen lymph nodes, , chest pain, shortness of breath, mood changes. POSITIVE muscle aches, body aches, joint swelling and swelling  Objective  Blood pressure 124/70, pulse 67, height 5\' 6"  (1.676 m), weight 165 lb (74.8 kg), SpO2 98 %.   General: No apparent distress alert and oriented x3 mood and affect normal, dressed appropriately.   Respiratory: Patient's speak in full sentences and does not appear short of breath  Cardiovascular: No lower extremity edema, non tender, no erythema  Neuro: Cranial nerves II through XII are intact, neurovascularly intact in all extremities with 2+ DTRs and 2+ pulses.  Gait \\antalgic  gait Knee: Left valgus deformity noted.  Abnormal thigh to calf ratio.  Tender to palpation over medial and PF joint line.  ROM full in flexion and extension and lower leg rotation. instability with valgus force.  painful patellar compression. Patellar glide with moderate crepitus. Patellar and quadriceps tendons unremarkable. Hamstring and quadriceps strength is normal. Contralateral knee shows mild arthritic changes\   After informed written and verbal consent, patient was seated on exam table. Left knee was prepped with alcohol swab and utilizing anterolateral  approach, patient's left knee space was injected with 4:1  marcaine 0.5%: Kenalog 40mg /dL. Patient tolerated the procedure well without immediate complications.   Impression and Recommendations:     This case required medical decision making of moderate complexity. The above documentation has been reviewed and is accurate and complete Lyndal Pulley, DO       Note: This dictation was prepared with Dragon dictation along with smaller phrase technology. Any transcriptional errors that result from this process are unintentional.

## 2020-02-02 NOTE — Assessment & Plan Note (Signed)
Chronic problem with exacerbation.  Steroid injection given today.  Discussed different medication management including topical anti-inflammatories avoid oral anti-inflammatories secondary to glaucoma when possible.  Discussed icing regimen.  Follow-up again in 4 to 6 weeks.  To be a candidate for viscosupplementation the patient did respond fairly well to previously

## 2020-02-02 NOTE — Patient Instructions (Addendum)
Good to see you Arnica lotion See me again in 4-6 weeks

## 2020-02-18 DIAGNOSIS — H401122 Primary open-angle glaucoma, left eye, moderate stage: Secondary | ICD-10-CM | POA: Diagnosis not present

## 2020-03-09 ENCOUNTER — Other Ambulatory Visit: Payer: Self-pay

## 2020-03-09 ENCOUNTER — Telehealth: Payer: Self-pay | Admitting: Internal Medicine

## 2020-03-09 ENCOUNTER — Ambulatory Visit: Payer: Medicare Other | Admitting: Family Medicine

## 2020-03-09 ENCOUNTER — Encounter: Payer: Self-pay | Admitting: Family Medicine

## 2020-03-09 DIAGNOSIS — H409 Unspecified glaucoma: Secondary | ICD-10-CM

## 2020-03-09 DIAGNOSIS — M1712 Unilateral primary osteoarthritis, left knee: Secondary | ICD-10-CM | POA: Diagnosis not present

## 2020-03-09 DIAGNOSIS — I5032 Chronic diastolic (congestive) heart failure: Secondary | ICD-10-CM

## 2020-03-09 NOTE — Progress Notes (Signed)
  Chronic Care Management   Note  03/09/2020 Name: Jennifer Huang MRN: [id number] DOB: May 11, 1951  Jennifer Huang is a 69 y.o. year old female who is a primary care patient of Hoyt Koch, MD. I reached out to [name] by phone today in response to a referral sent by [name]'s PCP, Hoyt Koch, MD.   [name] was given information about Chronic Care Management services today including:  1. CCM service includes personalized support from designated clinical staff supervised by her physician, including individualized plan of care and coordination with other care providers 2. 24/7 contact phone numbers for assistance for urgent and routine care needs. 3. Service will only be billed when office clinical staff spend 20 minutes or more in a month to coordinate care. 4. Only one practitioner may furnish and bill the service in a calendar month. 5. The patient may stop CCM services at any time (effective at the end of the month) by phone call to the office staff.   Patient agreed to services and verbal consent obtained.   This note is not being shared with the patient for the following reason: To respect privacy (The patient or proxy has requested that the information not be shared).  Follow up plan:   Earney Hamburg Upstream Scheduler

## 2020-03-09 NOTE — Patient Instructions (Addendum)
Knee [email or web address] Pennsaid 2x a day  Keep doing exercises [employer] and [employer] See me again in 2 months

## 2020-03-09 NOTE — Assessment & Plan Note (Signed)
Significant improvement at this point.  Discussed which activities to do which wants to avoid.  Discussed icing regimen and home exercises.  Discussed compression sleeves.  Increasing instability we will consider the possibility of custom bracing but I believe the patient would be noncompliant at this time.  Patient is approved for viscosupplementation if necessary.  Follow-up again in 2 months total time with patient 31 minutes

## 2020-03-09 NOTE — Assessment & Plan Note (Signed)
Discussed with patient about the possibility of discussing with her ophthalmologist before any more steroid injections.  Patient without plan.

## 2020-03-09 NOTE — Progress Notes (Signed)
Jennifer Huang Phone: [phone] Subjective:   [name], am serving as a scribe for Dr. Hulan Saas. This visit occurred during the SARS-CoV-2 public health emergency.  Safety protocols were in place, including screening questions prior to the visit, additional usage of staff PPE, and extensive cleaning of exam room while observing appropriate contact time as indicated for disinfecting solutions.   I'm seeing this patient by the request  of:  Hoyt Koch, MD  CC: left knee pain   [id number]   02/02/2020 Chronic problem with exacerbation.  Steroid injection given today.  Discussed different medication management including topical anti-inflammatories avoid oral anti-inflammatories secondary to glaucoma when possible.  Discussed icing regimen.  Follow-up again in 4 to 6 weeks.  To be a candidate for viscosupplementation the patient did respond fairly well to previously  Update 03/09/2020 Jennifer Huang is a 69 y.o. female coming in with complaint of left knee pain. Patient states that she is having crepitus in the joint but that her pain did improve following the injection last visit.  Patient states discomfort.  Feels better when she is wearing the compression.  Wanting to know if she is to be more than as well.        Past Medical History:  Diagnosis Date  . Allergies   . Allergy    year around allergies  . Chronic diastolic (congestive) heart failure ([place]) 05/10/2017  . Colon polyps    anal polyp  . Gout, unspecified   . Hyperlipidemia   . Primary localized osteoarthrosis, lower leg   . Thyroid mass    right  . Unspecified essential hypertension   . Unspecified glaucoma(365.9)    Left Eye   Past Surgical History:  Procedure Laterality Date  . COLON SURGERY     anal polyp excised gen'l anesthesia  . COLONOSCOPY  10-14-2010   jacobs-hx TA 2006  . ENUCLEATION     OD 2nd to Glaucoma    . OOPHORECTOMY     Had Blockage  . SALPINGECTOMY     Had Blockage  . THYROIDECTOMY Right 09/08/2019   Procedure: RIGHT HEMI-THYROIDECTOMY;  Surgeon: Leta Baptist, MD;  Location: [place];  Service: ENT;  Laterality: Right;  . TOTAL ABDOMINAL HYSTERECTOMY  2002   Social History   Socioeconomic History  . Marital status: Widowed    Spouse name: Not on file  . Number of children: 0  . Years of education: Not on file  . Highest education level: Not on file  Occupational History  . 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 Smoking status: Never Smoker  . Smokeless tobacco: Never Used  Substance and Sexual Activity  . Alcohol use: No    Alcohol/week: 0.0 standard drinks  . Drug use: No  . Sexual activity: Not Currently    Partners: Male    Birth control/protection: Surgical  Other Topics Concern  . Not on file  Social History Narrative   HSG, @ year college. Married '73. No children. On disability - orthopedic: knee, back, shortened leg.. SO - self-employed.      Dec '12 - many stressors: husband with cancer, brother with pancreatic cancer, housing and financial issues.             Social Determinants of Health   Financial Resource Strain:   . Difficulty of Paying Living Expenses:  Food Insecurity:   . Worried About Charity fundraiser in the Last Year:   . Arboriculturist in the Last Year:   Transportation Needs:   . Film/video editor (Medical):   Marland Kitchen Lack of Transportation (Non-Medical):   Physical Activity: Inactive  . Days of Exercise per Week: 0 days  . Minutes of Exercise per Session: 0 min  Stress:   . Feeling of Stress :   Social Connections:   . Frequency of Communication with Friends and Family:   . Frequency of Social Gatherings with Friends and Family:   . Attends Religious Services:   . Active Member of Clubs or Organizations:   . Attends Archivist Meetings:   Marland Kitchen Marital  Status:    Allergies  Allergen Reactions  . Penicillins     REACTION: rash/hives  . Sulfonamide Derivatives     REACTION: rash/hives   Family History  Problem Relation Age of Onset  . Osteoarthritis Mother   . Hypertension Mother   . Emphysema Father   . Diabetes Father   . Hypertension Father   . Arthritis Sister        hip replacments, wrist surgery  . Hyperlipidemia Sister   . Hypertension Sister   . Diabetes Brother   . Hypertension Brother   . Hyperlipidemia Brother   . Cancer - Other Brother   . Heart disease Brother   . Hypertension Brother   . Hyperlipidemia Brother   . Diabetes Brother   . Colon polyps Brother   . Diabetes Brother   . Heart disease Brother   . Hypertension Brother   . Hyperlipidemia Brother   . Colon polyps Brother   . Esophageal cancer Neg Hx   . Rectal cancer Neg Hx   . Stomach cancer Neg Hx      Current Outpatient Medications (Cardiovascular):  .  amLODipine (NORVASC) 10 MG tablet, Take 1 tablet (10 mg total) by mouth daily. .  furosemide (LASIX) 20 MG tablet, TAKE 1 TABLET BY MOUTH EVERY DAY IF NEEDED  Current Outpatient Medications (Respiratory):  Marland Kitchen  Azelastine HCl 0.15 % SOLN, Place 2 sprays into the nose every 12 (twelve) hours as needed. .  fexofenadine (ALLEGRA) 180 MG tablet, Take 180 mg by mouth daily. .  montelukast (SINGULAIR) 10 MG tablet, Take 10 mg by mouth every evening.    Current Outpatient Medications (Other):  Marland Kitchen  Ascorbic Acid (VITAMIN C PO), Take by mouth. .  brimonidine (ALPHAGAN P) 0.1 % SOLN, 3 (three) times daily.   Marland Kitchen  CALCIUM PO, Take by mouth. .  dorzolamide-timolol (COSOPT) 22.3-6.8 MG/ML ophthalmic solution, 1 drop 2 (two) times daily.   Marland Kitchen  lidocaine (LIDODERM) 5 %, Place 1 patch onto the skin daily. Remove & Discard patch within 12 hours or as directed by MD .  RHOPRESSA 0.02 % SOLN,  .  VITAMIN D PO, Take by mouth. .  VYZULTA 0.024 % SOLN, instill 1 drop at bedtime into left eye   Reviewed prior  external information including notes and imaging from  primary care provider As well as notes that were available from care everywhere and other healthcare systems.  Past medical history, social, surgical and family history all reviewed in electronic medical record.  No pertanent information unless stated regarding to the chief complaint.   Review of Systems:  No headache, visual changes, nausea, vomiting, diarrhea, constipation, dizziness, abdominal pain, skin rash, fevers, chills, night sweats, weight loss, swollen lymph nodes, body  aches, joint swelling, chest pain, shortness of breath, mood changes. POSITIVE muscle aches  Objective  Blood pressure 112/68, pulse 61, height 5\' 6"  (1.676 m), weight 165 lb (74.8 kg), SpO2 98 %.   General: No apparent distress alert and oriented x3 mood and affect normal, dressed appropriately.  HEENT: glaucoma noted  Respiratory: Patient's speak in full sentences and does not appear short of breath  Cardiovascular: No lower extremity edema, non tender, no erythema  Neuro: Cranial nerves II through XII are intact, neurovascularly intact in all extremities with 2+ DTRs and 2+ pulses.  Gait normal with good balance and coordination.  MSK: Patient's knee does have some abnormal thigh to calf ratio.  Mild instability noted with valgus and varus force.  Mild crepitus with range of motion.  Tender to palpation medial and lateral joint line.    Impression and Recommendations:     This case required medical decision making of moderate complexity. The above documentation has been reviewed and is accurate and complete Lyndal Pulley, DO       Note: This dictation was prepared with Dragon dictation along with smaller phrase technology. Any transcriptional errors that result from this process are unintentional.

## 2020-03-16 DIAGNOSIS — D440 Neoplasm of uncertain behavior of thyroid gland: Secondary | ICD-10-CM | POA: Diagnosis not present

## 2020-05-04 NOTE — Chronic Care Management (AMB) (Deleted)
Chronic Care Management Pharmacy  Name: Jennifer Huang  MRN: [id number] DOB: 1950/12/18   Chief Complaint/ HPI  Jennifer Huang,  69 y.o. , female presents for their Initial CCM visit with the clinical pharmacist via telephone due to COVID-19 Pandemic.  PCP : Hoyt Koch, MD Patient Care Team: Hoyt Koch, MD as PCP - General (Internal Medicine) Ubaldo Glassing, Marny Lowenstein, MD (Inactive) (Obstetrics and Gynecology) Marily Memos, MD (Orthopedic Surgery) Marylynn Pearson, MD (Ophthalmology) Mosetta Anis, MD (Allergy) Rulon Sera, MD (Rehabilitation) Golden Circle, MD as Referring Physician (Ophthalmology) Leta Baptist, MD as Consulting Physician (Otolaryngology) Golden Circle, MD as Referring Physician (Ophthalmology) Charlton Haws, [facility] as Pharmacist (Pharmacist)  Their chronic conditions include: Hypertension, Hyperlipidemia, Heart Failure, Osteoarthritis and Gout, Osteopenia   Office Visits: 07/28/19 Dr Sharlet Salina OV: ordered BMD. No med changes.  Consult Visit: 03/09/20 Dr Tamala Julian (sports med): knee pain f/u. No changes. 02/02/20 Dr Tamala Julian (sports med): steroid injection in L knee. Rec'd topical anti-inflammatories, arnica.   Allergies  Allergen Reactions   Penicillins     REACTION: rash/hives   Sulfonamide Derivatives     REACTION: rash/hives    Medications: Outpatient Encounter Medications as of 05/05/2020  Medication Sig   amLODipine (NORVASC) 10 MG tablet Take 1 tablet (10 mg total) by mouth daily.   Ascorbic Acid (VITAMIN C PO) Take by mouth.   Azelastine HCl 0.15 % SOLN Place 2 sprays into the nose every 12 (twelve) hours as needed.   brimonidine (ALPHAGAN P) 0.1 % SOLN 3 (three) times daily.     CALCIUM PO Take by mouth.   dorzolamide-timolol (COSOPT) 22.3-6.8 MG/ML ophthalmic solution 1 drop 2 (two) times daily.     fexofenadine (ALLEGRA) 180 MG tablet Take 180 mg by mouth daily.   furosemide (LASIX) 20 MG tablet TAKE  1 TABLET BY MOUTH EVERY DAY IF NEEDED   lidocaine (LIDODERM) 5 % Place 1 patch onto the skin daily. Remove & Discard patch within 12 hours or as directed by MD   montelukast (SINGULAIR) 10 MG tablet Take 10 mg by mouth every evening.   RHOPRESSA 0.02 % SOLN    VITAMIN D PO Take by mouth.   VYZULTA 0.024 % SOLN instill 1 drop at bedtime into left eye   No facility-administered encounter medications on file as of 05/05/2020.     Current Diagnosis/Assessment:    Goals Addressed   None      Hypertension / Heart Failure   HF type: Diastolic (grade 1 diastolic dysfunction) Last ejection fraction: 60-65% (05/01/2017) BP goal is:  <130/80  Office blood pressures are  BP Readings from Last 3 Encounters:  03/09/20 112/68  02/02/20 124/70  09/09/19 122/64   Patient checks BP at home {CHL HP BP Monitoring [phone]} Patient home BP readings are ranging: ***  Patient has failed these meds in the past: n/a Patient is currently {CHL Controlled/Uncontrolled:[phone]} on the following medications:   Amodipine 10 mg daily  Furosemide 20 mg daily PRN  We discussed {CHL HP Upstream Pharmacy discussion:[id number]}  Plan  Continue {CHL HP Upstream Pharmacy [phone]}   Hyperlipidemia   LDL goal < 100  Lipid Panel     Component Value Date/Time   CHOL 223 (H) 07/25/2018 1039   TRIG 87.0 07/25/2018 1039   TRIG 68 10/03/2006 1223   HDL 66.70 07/25/2018 1039   LDLCALC 139 (H) 07/25/2018 1039   LDLDIRECT 129.6 10/17/2012 1505    Hepatic Function Latest Ref Rng & Units 07/25/2018  04/19/2017 03/28/2016  Total Protein 6.0 - 8.3 g/dL 7.4 7.1 7.6  Albumin 3.5 - 5.2 g/dL 4.5 4.7 4.7  AST 0 - 37 U/L '16 16 16  ' ALT 0 - 35 U/L '13 12 15  ' Alk Phosphatase 39 - 117 U/L 60 61 79  Total Bilirubin 0.2 - 1.2 mg/dL 0.8 0.7 0.7  Bilirubin, Direct 0.0 - 0.3 mg/dL - 0.1 -     The 10-year ASCVD risk score Mikey Bussing DC Jr., et al., 2013) is: 9.7%   Values used to calculate the  score:     Age: 31 years     Sex: Female     Is Non-Hispanic African American: Yes     Diabetic: No     Tobacco smoker: No     Systolic Blood Pressure: 902 mmHg     Is BP treated: Yes     HDL Cholesterol: 66.7 mg/dL     Total Cholesterol: 223 mg/dL   Patient has failed these meds in past: n/a Patient is currently {CHL Controlled/[phone]} on the following medications:   No medications  We discussed:  {CHL HP Upstream Pharmacy discussion:[id number]}  Plan  Continue {CHL HP Upstream Pharmacy Plans:[phone]}  Chronic Pain   Degenerative disease/Osteoarthritis of knee  Patient has failed these meds in past: *** Patient is currently {CHL Controlled/[phone]} on the following medications:   Lidocaine 5% patch  We discussed:  ***  Plan  Continue {CHL HP Upstream Pharmacy Plans:[phone]}  Allergies   Patient has failed these meds in past: *** Patient is currently {CHL Controlled/[phone]} on the following medications:   Montelukast 10 mg HS - not on dispense report  Fexofenadine 180 mg daily  Azelastine 0.15% nasal spray q12h PRN  Olopatadine 0.2% eye drops  We discussed:  ***  Plan  Continue {CHL HP Upstream Pharmacy Plans:[phone]}  Glaucoma   Patient has failed these meds in past: *** Patient is currently {CHL Controlled/[phone]} on the following medications:   Brimonidine (Alphagan P) 0.1 eye drops - TID  Dorzolamide-Timolo (Cosopt) eye drops - BID  Netarsudil-Latanorpost (Rocklatan) eye drops - daily  We discussed:  ***  Plan  Continue {CHL HP Upstream Pharmacy Plans:[phone]}  Osteopenia    Last DEXA Scan: 07/28/2019  T-Score femoral neck: -1.4  T-Score total hip: n/a  T-Score lumbar spine: -1.6  T-Score forearm radius: n/a  10-year probability of major osteoporotic fracture: 6.9%  10-year probability of hip fracture: 0.8%  VITD  Date Value Ref Range Status    01/22/2018 23.87 (L) 30.00 - 100.00 ng/mL Final    Patient is not a candidate for pharmacologic treatment  Patient has failed these meds in past: n/a Patient is currently {CHL Controlled/[phone]} on the following medications:   Calcium  Vitamin D  We discussed:  {Osteoporosis Counseling:23892}  Plan  Continue {CHL HP Upstream Pharmacy Plans:[phone]}  Health Maintenance   Patient is currently {CHL Controlled/[phone]} on the following medications:   Vitamin C  We discussed:  ***  Plan  Continue {CHL HP Upstream Pharmacy Plans:[phone]}  Medication Management   Pt uses [facility] mail order pharmacy for all medications Uses pill box? {Yes or If no, why not?:20788} Pt endorses ***% compliance  We discussed: ***  Plan  {US Pharmacy IOXB:35329}    Follow up: *** month phone visit  ***

## 2020-05-05 ENCOUNTER — Telehealth: Payer: Medicare Other

## 2020-05-05 NOTE — Addendum Note (Signed)
Addended by: Aviva Signs M on: 05/05/2020 04:10 PM   Modules accepted: Orders

## 2020-05-14 DIAGNOSIS — W1839XA Other fall on same level, initial encounter: Secondary | ICD-10-CM | POA: Diagnosis not present

## 2020-05-14 DIAGNOSIS — M47812 Spondylosis without myelopathy or radiculopathy, cervical region: Secondary | ICD-10-CM | POA: Diagnosis not present

## 2020-05-14 DIAGNOSIS — S161XXA Strain of muscle, fascia and tendon at neck level, initial encounter: Secondary | ICD-10-CM | POA: Diagnosis not present

## 2020-05-14 DIAGNOSIS — M4312 Spondylolisthesis, cervical region: Secondary | ICD-10-CM | POA: Diagnosis not present

## 2020-05-14 DIAGNOSIS — M542 Cervicalgia: Secondary | ICD-10-CM | POA: Diagnosis not present

## 2020-05-14 DIAGNOSIS — I6523 Occlusion and stenosis of bilateral carotid arteries: Secondary | ICD-10-CM | POA: Diagnosis not present

## 2020-05-20 ENCOUNTER — Ambulatory Visit: Payer: Medicare Other | Attending: Physician Assistant | Admitting: Physical Therapy

## 2020-05-20 ENCOUNTER — Other Ambulatory Visit: Payer: Self-pay

## 2020-05-20 ENCOUNTER — Encounter: Payer: Self-pay | Admitting: Physical Therapy

## 2020-05-20 DIAGNOSIS — R252 Cramp and spasm: Secondary | ICD-10-CM

## 2020-05-20 DIAGNOSIS — M542 Cervicalgia: Secondary | ICD-10-CM | POA: Diagnosis not present

## 2020-05-20 DIAGNOSIS — M6281 Muscle weakness (generalized): Secondary | ICD-10-CM | POA: Diagnosis not present

## 2020-05-20 NOTE — Therapy (Signed)
[address] [place], [place], [address] Phone: [phone]   Fax:  [phone]  Physical Therapy Evaluation  Patient Details  Name: Jennifer Huang MRN: [id number] Date of Birth: 03-23-1951 Referring Provider (PT): Loney Loh Date: 05/20/2020   PT End of Session - 05/20/20 0941    Visit Number 1    Date for PT Re-Evaluation 07/21/20    PT Start Time 0850    PT Stop Time 0929    PT Time Calculation (min) 39 min    Activity Tolerance Patient tolerated treatment well    Behavior During Therapy [facility] for tasks assessed/performed           Past Medical History:  Diagnosis Date  . Allergies   . Allergy    year around allergies  . Chronic diastolic (congestive) heart failure ([place]) 05/10/2017  . Colon polyps    anal polyp  . Gout, unspecified   . Hyperlipidemia   . Primary localized osteoarthrosis, lower leg   . Thyroid mass    right  . Unspecified essential hypertension   . Unspecified glaucoma(365.9)    Left Eye    Past Surgical History:  Procedure Laterality Date  . COLON SURGERY     anal polyp excised gen'l anesthesia  . COLONOSCOPY  10-14-2010   jacobs-hx TA 2006  . ENUCLEATION     OD 2nd to Glaucoma  . OOPHORECTOMY     Had Blockage  . SALPINGECTOMY     Had Blockage  . THYROIDECTOMY Right 09/08/2019   Procedure: RIGHT HEMI-THYROIDECTOMY;  Surgeon: Leta Baptist, MD;  Location: [facility];  Service: ENT;  Laterality: Right;  . TOTAL ABDOMINAL HYSTERECTOMY  2002    There were no vitals filed for this visit.    Subjective Assessment - 05/20/20 0852    Subjective Pt reports that she had a fall on 04/26/2020 when a tiki torch "popped" on her and split in half while she was leaning on it; she fell and hit her head on the fence. Pt reports she has been having neck pain since then. Pt reports that she is a little off balance because she also has back pain with RLE N/T. Pt  denies N/T in hands. Pt denies radiating pain into UE. Pt reports pain localized to R neck/UT region.    Pertinent History CHF, HTN, OA    Limitations Sitting;Lifting;House hold activities    Diagnostic tests xrays    Patient Stated Goals get rid of the pain, be able to sleep through night again    Currently in Pain? Yes    Pain Score 4     Pain Location Neck    Pain Orientation Right;Mid    Pain Descriptors / Indicators Aching;Throbbing    Pain Type Acute pain    Pain Onset 1 to 4 weeks ago    Pain Frequency Intermittent    Aggravating Factors  reaching overhead, picking things up, prolonged activity, sleeping    Pain Relieving Factors rest, mobic, heat, self massage              [facility] PT Assessment - 05/20/20 0001      Assessment   Medical Diagnosis Cervicalgia    Referring Provider (PT) [place]    Onset Date/Surgical Date 04/26/20    Hand Dominance Right    Prior Therapy PT for R knee      Precautions   Precautions None  Restrictions   Weight Bearing Restrictions No      Balance Screen   Has the patient fallen in the past 6 months Yes   reports numbness in RLE throws her off balance   How many times? 1    Has the patient had a decrease in activity level because of a fear of falling?  No    Is the patient reluctant to leave their home because of a fear of falling?  No      Home Environment   Additional Comments 2 sets of stairs; reports no trouble with stairs if she holds onto rail      Prior Function   Level of Independence Independent    Vocation Retired    [employer] wants to be able to return to housework and cleaning out garage    [employer] technologist Intact      Posture/Postural Control   Posture/Postural Control Postural limitations    Postural Limitations Forward head      ROM / Strength   AROM / PROM / Strength AROM;Strength      AROM   Overall AROM Comments shoulder ROM pain at end range  abduction and ER; mild limitation in R functional ER/IR    AROM Assessment Site Cervical    Cervical Flexion 30    Cervical Extension 20    Cervical - Right Side Bend 20    Cervical - Left Side Bend 15    Cervical - Right Rotation 25    Cervical - Left Rotation 40      Strength   Overall Strength Comments 4+/5 LUE, 4/5 RUE with pain in R UT with resisted MMT      Palpation   Palpation comment tender to palpation B UT, R cervical paraspinals/suboccipitals/bicep tendon/tricep      Special Tests    Special Tests Cervical    Cervical Tests Spurling's      Spurling's   Findings Negative    Side Right                      Objective measurements completed on examination: See above findings.       [facility] Adult PT Treatment/Exercise - 05/20/20 0001      Exercises   Exercises Neck      Neck Exercises: Standing   Neck Retraction 10 reps;3 secs      Neck Exercises: Stretches   Upper Trapezius Stretch Right;Left;1 rep;30 seconds    Levator Stretch Right;Left;1 rep;30 seconds    Other Neck Stretches rotation stretch with towel, cervical flexion stretch with overpressure hands behind head                  PT Education - 05/20/20 0941    Education Details Pt educated on HEP and POC    Person(s) Educated Patient    Methods Explanation;Demonstration;Handout    Comprehension Verbalized understanding;Returned demonstration            PT Short Term Goals - 05/20/20 1032      PT SHORT TERM GOAL #1   Title Pt will demonstrate proper recall of HEP    Time 2    Period Weeks    Status New    Target Date 06/03/20             PT Long Term Goals - 05/20/20 1032      PT LONG TERM GOAL #1  Title Pt will demo B cervical ROM WFL in order to complete functional ADLs    Time 6    Period Weeks    Status New    Target Date 07/01/20      PT LONG TERM GOAL #2   Title Pt will be able to perform all household activities such as reaching into cabinets and  overhead without limitation or pain.    Time 6    Period Weeks    Status New    Target Date 07/01/20      PT LONG TERM GOAL #3   Title Pt will be increase R shoulder strength to 4+/5 with no increase in symtpoms.    Time 6    Period Weeks    Status New    Target Date 07/01/20      PT LONG TERM GOAL #4   Title Pt will report ability to sleep through night without increase in cervical pain    Time 6    Period Weeks    Status New    Target Date 07/01/20                  Plan - 05/20/20 1028    Clinical Impression Statement Pt presents to clinic with reports of cervicalgia R>L following a fall on 04/26/2020. Pt has had xrays neg for fx. Pt demos limited and painful cervical ROM, pain at end range with shoulder ROM, L UE MMT decreased strength, and tenderness to palpation B UT/cervical paraspinals R>L. Pt is having trouble completing housework and other ADLs and would like to return to these activities. Pt would benefit from skilled PT to address the above impairments.    Examination-Activity Limitations Bend;Carry;Lift;Reach Overhead    Examination-Participation Restrictions Cleaning;Community Activity;Interpersonal Relationship;Laundry    Stability/Clinical Decision Making Stable/Uncomplicated    Clinical Decision Making Low    Rehab Potential Good    PT Frequency 2x / week    PT Duration 6 weeks    PT Treatment/Interventions ADLs/Self Care Home Management;Cryotherapy;Electrical Stimulation;Ultrasound;Moist Heat;Iontophoresis 4mg /ml Dexamethasone;Therapeutic activities;Neuromuscular re-education;Manual techniques;Therapeutic exercise;Patient/family education;Passive range of motion;Dry needling;Taping    PT Next Visit Plan review HEP, UE/cervical strength and flexibility, manual/modalities as indicated    PT Home Exercise Plan UT stretch, levator stretch, cervical rotation stretch w/ towel, cervical retraction, cervical flexion w/ OP hands behind head    Consulted and Agree with  Plan of Care Patient           Patient will benefit from skilled therapeutic intervention in order to improve the following deficits and impairments:  Decreased range of motion, Increased muscle spasms, Pain, Impaired flexibility, Decreased strength, Impaired UE functional use  Visit Diagnosis: Cervicalgia  Cramp and spasm  Muscle weakness (generalized)     Problem List Patient Active Problem List   Diagnosis Date Noted  . S/P partial thyroidectomy 09/08/2019  . 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 Degenerative arthritis of left knee 05/29/2019  . Talar dome fracture 05/22/2019  . Atypical mole 01/22/2018  . Chronic diastolic (congestive) heart failure ([place]) 05/10/2017  . DDD (degenerative disc disease), lumbar 01/10/2017  . 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 Goiter 03/17/2014  . Routine health maintenance 10/19/2012  . Elevated LDL cholesterol level 10/17/2012  . Gout, unspecified 09/22/2008  . Unspecified glaucoma 09/22/2008  . Essential hypertension 07/12/2007  . OSTEOARTHROSIS, LOCAL, PRIMARY, LOWER LEG 07/12/2007   Amador Cunas, PT, DPT Donald Prose Suellyn Meenan 05/20/2020, 10:35 AM  [place]  [address] [place], [place], [address] Phone: [phone]   Fax:  [phone]  Name: ZADIA UHDE MRN: [id number] Date of Birth: 02-28-1951

## 2020-05-20 NOTE — Patient Instructions (Signed)
Access Code: [email or web address] URL: [email or web address] Date: 05/20/2020 Prepared by: Amador Cunas  Exercises Seated Upper Trapezius Stretch - 1 x daily - 7 x weekly - 3 sets - 2 reps - 30 sec hold Seated Levator Scapulae Stretch - 1 x daily - 7 x weekly - 3 sets - 2 reps - 30 sec hold Seated Cervical Retraction - 1 x daily - 7 x weekly - 3 sets - 10 reps - 3 sec hold Seated Assisted Cervical Rotation with Towel - 1 x daily - 7 x weekly - 3 sets - 5 reps - 5 sec hold Seated Cervical Flexion Stretch with Finger Support Behind Neck - 1 x daily - 7 x weekly - 3 sets - 5 reps - 10 sec hold

## 2020-05-23 ENCOUNTER — Other Ambulatory Visit: Payer: Self-pay | Admitting: Internal Medicine

## 2020-05-23 DIAGNOSIS — I10 Essential (primary) hypertension: Secondary | ICD-10-CM

## 2020-05-25 ENCOUNTER — Ambulatory Visit: Payer: Medicare Other | Attending: Physician Assistant | Admitting: Physical Therapy

## 2020-05-25 ENCOUNTER — Other Ambulatory Visit: Payer: Self-pay

## 2020-05-25 DIAGNOSIS — M6281 Muscle weakness (generalized): Secondary | ICD-10-CM | POA: Diagnosis not present

## 2020-05-25 DIAGNOSIS — R252 Cramp and spasm: Secondary | ICD-10-CM | POA: Insufficient documentation

## 2020-05-25 DIAGNOSIS — M542 Cervicalgia: Secondary | ICD-10-CM | POA: Diagnosis not present

## 2020-05-25 NOTE — Therapy (Signed)
[address] [place], [place], [address] Phone: [phone]   Fax:  [phone]  Physical Therapy Treatment  Patient Details  Name: Jennifer Huang MRN: [id number] Date of Birth: 1951-01-19 Referring Provider (PT): Loney Loh Date: 05/25/2020   PT End of Session - 05/25/20 0957    Visit Number 2    Date for PT Re-Evaluation 07/21/20    PT Start Time 0930    PT Stop Time 1012    PT Time Calculation (min) 42 min           Past Medical History:  Diagnosis Date  . Allergies   . Allergy    year around allergies  . Chronic diastolic (congestive) heart failure ([place] [facility]) 05/10/2017  . Colon polyps    anal polyp  . Gout, unspecified   . Hyperlipidemia   . Primary localized osteoarthrosis, lower leg   . Thyroid mass    right  . Unspecified essential hypertension   . Unspecified glaucoma(365.9)    Left Eye    Past Surgical History:  Procedure Laterality Date  . COLON SURGERY     anal polyp excised gen'l anesthesia  . COLONOSCOPY  10-14-2010   jacobs-hx TA 2006  . ENUCLEATION     OD 2nd to Glaucoma  . OOPHORECTOMY     Had Blockage  . SALPINGECTOMY     Had Blockage  . THYROIDECTOMY Right 09/08/2019   Procedure: RIGHT HEMI-THYROIDECTOMY;  Surgeon: Leta Baptist, MD;  Location: [facility];  Service: ENT;  Laterality: Right;  . TOTAL ABDOMINAL HYSTERECTOMY  2002    There were no vitals filed for this visit.   Subjective Assessment - 05/25/20 0936    Subjective feeling better, tight an dstiff but better    Currently in Pain? Yes    Pain Location Neck    Pain Orientation Right;Mid                             [facility] Adult PT Treatment/Exercise - 05/25/20 0001      Neck Exercises: Machines for Strengthening   UBE (Upper Arm Bike) L 2 2 min fwd/ 2 min backward      Neck Exercises: Standing   Neck Retraction 10 reps;3 secs   head on ball on wall   Other  Standing Exercises 3# shld shruggs, backward rolls, shld ext and scap retraction    Other Standing Exercises ball vs wall 5 x CW and CCW      Modalities   Modalities Moist Heat;Electrical Stimulation      Moist Heat Therapy   Number Minutes Moist Heat 15 Minutes    Moist Heat Location Cervical      Electrical Stimulation   Electrical Stimulation Location RT cerv/UT/rhom    Electrical Stimulation Action IFC    Electrical Stimulation Parameters seated    Electrical Stimulation Goals Pain      Manual Therapy   Manual Therapy Soft tissue mobilization;Passive ROM    Manual therapy comments multi TP and tenderness RT side    Soft tissue mobilization cerv/trap and rhom    Passive ROM cerv PROM to tolerance                    PT Short Term Goals - 05/20/20 1032      PT SHORT TERM GOAL #1   Title Pt will demonstrate proper recall of HEP  Time 2    Period Weeks    Status New    Target Date 06/03/20             PT Long Term Goals - 05/20/20 1032      PT LONG TERM GOAL #1   Title Pt will demo B cervical ROM WFL in order to complete functional ADLs    Time 6    Period Weeks    Status New    Target Date 07/01/20      PT LONG TERM GOAL #2   Title Pt will be able to perform all household activities such as reaching into cabinets and overhead without limitation or pain.    Time 6    Period Weeks    Status New    Target Date 07/01/20      PT LONG TERM GOAL #3   Title Pt will be increase R shoulder strength to 4+/5 with no increase in symtpoms.    Time 6    Period Weeks    Status New    Target Date 07/01/20      PT LONG TERM GOAL #4   Title Pt will report ability to sleep through night without increase in cervical pain    Time 6    Period Weeks    Status New    Target Date 07/01/20                 Plan - 05/25/20 0957    Clinical Impression Statement tolerated initail progressionof ther ex well, cued needed to decrease compensation. tenderness  and TP in RT UT/rhom and cerv area, limited ROM -PROM to tolerance. pt felt relief with estim and MD. pt veb doing HEP form evaluation    PT Treatment/Interventions ADLs/Self Care Home Management;Cryotherapy;Electrical Stimulation;Ultrasound;Moist Heat;Iontophoresis 4mg /ml Dexamethasone;Therapeutic activities;Neuromuscular re-education;Manual techniques;Therapeutic exercise;Patient/family education;Passive range of motion;Dry needling;Taping    PT Next Visit Plan review HEP, UE/cervical strength and flexibility, manual/modalities as indicated           Patient will benefit from skilled therapeutic intervention in order to improve the following deficits and impairments:  Decreased range of motion, Increased muscle spasms, Pain, Impaired flexibility, Decreased strength, Impaired UE functional use  Visit Diagnosis: Cramp and spasm  Muscle weakness (generalized)  Cervicalgia     Problem List Patient Active Problem List   Diagnosis Date Noted  . S/P partial thyroidectomy 09/08/2019  . 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 Degenerative arthritis of left knee 05/29/2019  . Talar dome fracture 05/22/2019  . Atypical mole 01/22/2018  . Chronic diastolic (congestive) heart failure ([facility]) 05/10/2017  . DDD (degenerative disc disease), lumbar 01/10/2017  . 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 Goiter 03/17/2014  . Routine health maintenance 10/19/2012  . Elevated LDL cholesterol level 10/17/2012  . Gout, unspecified 09/22/2008  . Unspecified glaucoma 09/22/2008  . Essential hypertension 07/12/2007  . OSTEOARTHROSIS, LOCAL, PRIMARY, LOWER LEG 07/12/2007    Jennifer Huang,ANGIE PTA 05/25/2020, 9:59 AM  [address] [place], [place], [address] Phone: [phone]   Fax:  [phone]  Name: Jennifer Huang MRN: [id number] Date of Birth: October 18, 1951

## 2020-05-27 ENCOUNTER — Ambulatory Visit: Payer: Medicare Other | Admitting: Physical Therapy

## 2020-05-27 ENCOUNTER — Other Ambulatory Visit: Payer: Self-pay

## 2020-05-27 DIAGNOSIS — R252 Cramp and spasm: Secondary | ICD-10-CM

## 2020-05-27 DIAGNOSIS — M542 Cervicalgia: Secondary | ICD-10-CM | POA: Diagnosis not present

## 2020-05-27 DIAGNOSIS — M6281 Muscle weakness (generalized): Secondary | ICD-10-CM | POA: Diagnosis not present

## 2020-05-27 NOTE — Therapy (Signed)
[address] [place], [place], [address] Phone: [phone]   Fax:  [phone]  Physical Therapy Treatment  Patient Details  Name: Jennifer Huang MRN: [id number] Date of Birth: 1951/04/18 Referring Provider (PT): Loney Loh Date: 05/27/2020   PT End of Session - 05/27/20 1001    Visit Number 3    Date for PT Re-Evaluation 07/21/20    PT Start Time 0936    PT Stop Time 1018    PT Time Calculation (min) 42 min           Past Medical History:  Diagnosis Date  . Allergies   . Allergy    year around allergies  . Chronic diastolic (congestive) heart failure ([facility]) 05/10/2017  . Colon polyps    anal polyp  . Gout, unspecified   . Hyperlipidemia   . Primary localized osteoarthrosis, lower leg   . Thyroid mass    right  . Unspecified essential hypertension   . Unspecified glaucoma(365.9)    Left Eye    Past Surgical History:  Procedure Laterality Date  . COLON SURGERY     anal polyp excised gen'l anesthesia  . COLONOSCOPY  10-14-2010   jacobs-hx TA 2006  . ENUCLEATION     OD 2nd to Glaucoma  . OOPHORECTOMY     Had Blockage  . SALPINGECTOMY     Had Blockage  . THYROIDECTOMY Right 09/08/2019   Procedure: RIGHT HEMI-THYROIDECTOMY;  Surgeon: Leta Baptist, MD;  Location: [facility];  Service: ENT;  Laterality: Right;  . TOTAL ABDOMINAL HYSTERECTOMY  2002    There were no vitals filed for this visit.   Subjective Assessment - 05/27/20 0937    Subjective "limping with knee today" neck is okay- sore after last tx but estim/MH helped    Currently in Pain? Yes    Pain Score 3     Pain Location Neck    Pain Orientation Right                             [facility] Adult PT Treatment/Exercise - 05/27/20 0001      Neck Exercises: Machines for Strengthening   UBE (Upper Arm Bike) L 3 3 fwd/3back      Neck Exercises: Theraband   Shoulder Extension 10 reps;Red     Rows 10 reps;Red    Shoulder ADduction 10 reps;Red    Shoulder External Rotation 10 reps;Red      Neck Exercises: Standing   Neck Retraction 15 reps;3 secs   head on ball on wall   Other Standing Exercises 4# shld shruggs, backward rolls, shld ext and scap retraction    Other Standing Exercises postural wall angels 10 x      Moist Heat Therapy   Number Minutes Moist Heat 15 Minutes    Moist Heat Location Cervical      Electrical Stimulation   Electrical Stimulation Location RT cerv/UT/rhom    Electrical Stimulation Action IFC    Electrical Stimulation Parameters seated    Electrical Stimulation Goals Pain      Manual Therapy   Manual Therapy Soft tissue mobilization;Passive ROM    Manual therapy comments multi TP and tenderness RT side    Soft tissue mobilization cerv/trap and rhom    Passive ROM cerv PROM to tolerance  PT Short Term Goals - 05/27/20 1001      PT SHORT TERM GOAL #1   Title Pt will demonstrate proper recall of HEP    Status Achieved             PT Long Term Goals - 05/20/20 1032      PT LONG TERM GOAL #1   Title Pt will demo B cervical ROM WFL in order to complete functional ADLs    Time 6    Period Weeks    Status New    Target Date 07/01/20      PT LONG TERM GOAL #2   Title Pt will be able to perform all household activities such as reaching into cabinets and overhead without limitation or pain.    Time 6    Period Weeks    Status New    Target Date 07/01/20      PT LONG TERM GOAL #3   Title Pt will be increase R shoulder strength to 4+/5 with no increase in symtpoms.    Time 6    Period Weeks    Status New    Target Date 07/01/20      PT LONG TERM GOAL #4   Title Pt will report ability to sleep through night without increase in cervical pain    Time 6    Period Weeks    Status New    Target Date 07/01/20                 Plan - 05/27/20 1001    Clinical Impression Statement STG met for cerv  stretches. progressed ther ex with cuing. pt still ery tender and TP in RT cerv/UT and rhom but did tolerate increased STW. relief with modalities    PT Treatment/Interventions ADLs/Self Care Home Management;Cryotherapy;Electrical Stimulation;Ultrasound;Moist Heat;Iontophoresis 26m/ml Dexamethasone;Therapeutic activities;Neuromuscular re-education;Manual techniques;Therapeutic exercise;Patient/family education;Passive range of motion;Dry needling;Taping    PT Next Visit Plan increase HEP, UE/cervical strength and flexibility, manual/modalities as indicated           Patient will benefit from skilled therapeutic intervention in order to improve the following deficits and impairments:  Decreased range of motion, Increased muscle spasms, Pain, Impaired flexibility, Decreased strength, Impaired UE functional use  Visit Diagnosis: Cramp and spasm  Muscle weakness (generalized)  Cervicalgia     Problem List Patient Active Problem List   Diagnosis Date Noted  . S/P partial thyroidectomy 09/08/2019  . 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 Degenerative arthritis of left knee 05/29/2019  . Talar dome fracture 05/22/2019  . Atypical mole 01/22/2018  . Chronic diastolic (congestive) heart failure ([place] 05/10/2017  . DDD (degenerative disc disease), lumbar 01/10/2017  . 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 Goiter 03/17/2014  . Routine health maintenance 10/19/2012  . Elevated LDL cholesterol level 10/17/2012  . Gout, unspecified 09/22/2008  . Unspecified glaucoma 09/22/2008  . Essential hypertension 07/12/2007  . OSTEOARTHROSIS, LOCAL, PRIMARY, LOWER LEG 07/12/2007    Zylah Elsbernd,ANGIE PTA 05/27/2020, 10:03 AM  [address] [address]Phone: [phone]  Fax:  [phone] Name: MSHARISSA BRIERLEYMRN: [id number] of Birth: [birth year]

## 2020-05-31 ENCOUNTER — Encounter: Payer: Self-pay | Admitting: Internal Medicine

## 2020-05-31 ENCOUNTER — Other Ambulatory Visit: Payer: Self-pay

## 2020-05-31 ENCOUNTER — Ambulatory Visit (INDEPENDENT_AMBULATORY_CARE_PROVIDER_SITE_OTHER): Payer: Medicare Other | Admitting: Internal Medicine

## 2020-05-31 VITALS — BP 126/84 | HR 68 | Temp 98.2°F | Ht 66.0 in | Wt 164.0 lb

## 2020-05-31 DIAGNOSIS — M791 Myalgia, unspecified site: Secondary | ICD-10-CM

## 2020-05-31 DIAGNOSIS — Z0000 Encounter for general adult medical examination without abnormal findings: Secondary | ICD-10-CM | POA: Diagnosis not present

## 2020-05-31 LAB — COMPREHENSIVE METABOLIC PANEL
AG Ratio: 2.1 (calc) (ref 1.0–2.5)
ALT: 13 U/L (ref 6–29)
AST: 17 U/L (ref 10–35)
Albumin: 4.6 g/dL (ref 3.6–5.1)
Alkaline phosphatase (APISO): 53 U/L (ref 37–153)
BUN: 20 mg/dL (ref 7–25)
CO2: 28 mmol/L (ref 20–32)
Calcium: 10.2 mg/dL (ref 8.6–10.4)
Chloride: 103 mmol/L (ref 98–110)
Creat: 0.87 mg/dL (ref 0.50–0.99)
Globulin: 2.2 g/dL (calc) (ref 1.9–3.7)
Glucose, Bld: 90 mg/dL (ref 65–99)
Potassium: 4 mmol/L (ref 3.5–5.3)
Sodium: 140 mmol/L (ref 135–146)
Total Bilirubin: 0.6 mg/dL (ref 0.2–1.2)
Total Protein: 6.8 g/dL (ref 6.1–8.1)

## 2020-05-31 LAB — CBC
HCT: 36.8 % (ref 35.0–45.0)
Hemoglobin: 11.9 g/dL (ref 11.7–15.5)
MCH: 29.5 pg (ref 27.0–33.0)
MCHC: 32.3 g/dL (ref 32.0–36.0)
MCV: 91.3 fL (ref 80.0–100.0)
MPV: 11 fL (ref 7.5–12.5)
Platelets: 261 10*3/uL (ref 140–400)
RBC: 4.03 10*6/uL (ref 3.80–5.10)
RDW: 12.3 % (ref 11.0–15.0)
WBC: 6.4 10*3/uL (ref 3.8–10.8)

## 2020-05-31 LAB — LIPID PANEL
Cholesterol: 245 mg/dL — ABNORMAL HIGH (ref <200)
HDL: 74 mg/dL (ref >=50)
LDL Cholesterol (Calc): 143 mg/dL (calc) — ABNORMAL HIGH
Non-HDL Cholesterol (Calc): 171 mg/dL (calc) — ABNORMAL HIGH (ref <130)
Total CHOL/HDL Ratio: 3.3 (calc) (ref <5.0)
Triglycerides: 152 mg/dL — ABNORMAL HIGH (ref <150)

## 2020-05-31 LAB — VITAMIN D 25 HYDROXY (VIT D DEFICIENCY, FRACTURES): Vit D, 25-Hydroxy: 30 ng/mL (ref 30–100)

## 2020-05-31 LAB — TSH: TSH: 8.44 mIU/L — ABNORMAL HIGH (ref 0.40–4.50)

## 2020-05-31 LAB — VITAMIN B12: Vitamin B-12: 1033 pg/mL (ref 200–1100)

## 2020-05-31 MED ORDER — CYCLOBENZAPRINE HCL 5 MG PO TABS: 5.0000 mg | ORAL_TABLET | Freq: Three times a day (TID) | ORAL | 1 refills | Status: AC | PRN

## 2020-05-31 NOTE — Patient Instructions (Signed)
We have sent in flexeril to use up to 3 times a day for pain.

## 2020-05-31 NOTE — Addendum Note (Signed)
Addended by: Steward Ros on: 05/31/2020 02:36 PM   Modules accepted: Orders

## 2020-05-31 NOTE — Progress Notes (Signed)
   Subjective:   Patient ID: Jennifer Huang, female    DOB: 1951/02/23, 69 y.o.   MRN: [id number]  HPI The patient is a 69 YO female coming in for right sided leg pain. Had a fall in the recent past where she was trying to put up a tikki torch and it fell and took her with it. She did hit the right side of her body. Denies LOC or hitting head. She has been using otc medications for pain since that time. She has gone to orthopedics and they are having her do PT but this is not helping enough. She feels her muscles are just very tight and wonders if a muscle relaxer could help. Denies broken bones just overall aching and sore and tight.   Review of Systems  Constitutional: Negative.   HENT: Negative.   Eyes: Negative.   Respiratory: Negative for cough, chest tightness and shortness of breath.   Cardiovascular: Negative for chest pain, palpitations and leg swelling.  Gastrointestinal: Negative for abdominal distention, abdominal pain, constipation, diarrhea, nausea and vomiting.  Musculoskeletal: Positive for myalgias.  Skin: Negative.   Neurological: Negative.   Psychiatric/Behavioral: Negative.     Objective:  Physical Exam Constitutional:      Appearance: She is well-developed.  HENT:     Head: Normocephalic and atraumatic.  Cardiovascular:     Rate and Rhythm: Normal rate and regular rhythm.  Pulmonary:     Effort: Pulmonary effort is normal. No respiratory distress.     Breath sounds: Normal breath sounds. No wheezing or rales.  Abdominal:     General: Bowel sounds are normal. There is no distension.     Palpations: Abdomen is soft.     Tenderness: There is no abdominal tenderness. There is no rebound.  Musculoskeletal:        General: Tenderness present.     Cervical back: Normal range of motion.  Skin:    General: Skin is warm and dry.  Neurological:     Mental Status: She is alert and oriented to person, place, and time.     Coordination: Coordination normal.      Vitals:   05/31/20 1404  BP: 126/84  Pulse: 68  Temp: 98.2 F (36.8 C)  TempSrc: Oral  SpO2: 98%  Weight: 164 lb (74.4 kg)  Height: 5\' 6"  (1.676 m)    This visit occurred during the SARS-CoV-2 public health emergency.  Safety protocols were in place, including screening questions prior to the visit, additional usage of staff PPE, and extensive cleaning of exam room while observing appropriate contact time as indicated for disinfecting solutions.   Assessment & Plan:

## 2020-06-01 ENCOUNTER — Encounter: Payer: Self-pay | Admitting: Physical Therapy

## 2020-06-01 ENCOUNTER — Ambulatory Visit: Payer: Medicare Other | Admitting: Physical Therapy

## 2020-06-01 DIAGNOSIS — M6281 Muscle weakness (generalized): Secondary | ICD-10-CM

## 2020-06-01 DIAGNOSIS — M791 Myalgia, unspecified site: Secondary | ICD-10-CM | POA: Insufficient documentation

## 2020-06-01 DIAGNOSIS — R252 Cramp and spasm: Secondary | ICD-10-CM

## 2020-06-01 DIAGNOSIS — M542 Cervicalgia: Secondary | ICD-10-CM

## 2020-06-01 NOTE — Assessment & Plan Note (Signed)
Rx flexeril to see if this helps her symptoms.

## 2020-06-01 NOTE — Therapy (Signed)
[place], [place], [address] Phone: [phone]   Fax:  [phone]  Physical Therapy Treatment  Patient Details  Name: Jennifer Huang MRN: [id number] Date of Birth: [birth year] Referring Provider (PT): Sharlet Salina   Encounter Date: 06/01/2020   PT End of Session - 06/01/20 1020    Visit Number 4    Date for PT Re-Evaluation 07/21/20    PT Start Time 0930    PT Stop Time 1024    PT Time Calculation (min) 54 min    Activity Tolerance Patient tolerated treatment well    Behavior During Therapy [facility] for tasks assessed/performed           Past Medical History:  Diagnosis Date   Allergies    Allergy    year around allergies   Chronic diastolic (congestive) heart failure ([place]) 05/10/2017   Colon polyps    anal polyp   Gout, unspecified    Hyperlipidemia    Primary localized osteoarthrosis, lower leg    Thyroid mass    right   Unspecified essential hypertension    Unspecified glaucoma(365.9)    Left Eye    Past Surgical History:  Procedure Laterality Date   COLON SURGERY     anal polyp excised gen'l anesthesia   COLONOSCOPY  10-14-2010   jacobs-hx TA 2006   ENUCLEATION     OD 2nd to Glaucoma   OOPHORECTOMY     Had Blockage   SALPINGECTOMY     Had Blockage   THYROIDECTOMY Right 09/08/2019   Procedure: RIGHT HEMI-THYROIDECTOMY;  Surgeon: Leta Baptist, MD;  Location: [place];  Service: ENT;  Laterality: Right;   TOTAL ABDOMINAL HYSTERECTOMY  2002    There were no vitals filed for this visit.   Subjective Assessment - 06/01/20 0939    Subjective Pt reports slight decrease in R UT pain and an increase in nerve pain from R knee down to R foot. Pt denies increase in headaches or blurred vision; states she will be seeing opthamologist tomorrow.    Currently in Pain? Yes    Pain Score 2     Pain Location Neck    Pain Orientation Right                              [facility] Adult PT Treatment/Exercise - 06/01/20 0001      Neck Exercises: Machines for Strengthening   UBE (Upper Arm Bike) L 3 3 fwd/3back    Nustep L3 x 6 min      Neck Exercises: Theraband   Shoulder Extension 20 reps;Red    Rows 20 reps;Red    Shoulder External Rotation Red;10 reps    Shoulder Internal Rotation Red;10 reps      Moist Heat Therapy   Number Minutes Moist Heat 10 Minutes    Moist Heat Location Cervical      Electrical Stimulation   Electrical Stimulation Location RT cerv/UT/rhom    Electrical Stimulation Action IFC    Electrical Stimulation Parameters supine    Electrical Stimulation Goals Pain      Manual Therapy   Manual Therapy Soft tissue mobilization;Passive ROM    Manual therapy comments multi TP and tenderness RT side    Soft tissue mobilization cerv/trap and rhom    Passive ROM cerv PROM to tolerance  PT Short Term Goals - 05/27/20 1001      PT SHORT TERM GOAL #1   Title Pt will demonstrate proper recall of HEP    Status Achieved             PT Long Term Goals - 05/20/20 1032      PT LONG TERM GOAL #1   Title Pt will demo B cervical ROM WFL in order to complete functional ADLs    Time 6    Period Weeks    Status New    Target Date 07/01/20      PT LONG TERM GOAL #2   Title Pt will be able to perform all household activities such as reaching into cabinets and overhead without limitation or pain.    Time 6    Period Weeks    Status New    Target Date 07/01/20      PT LONG TERM GOAL #3   Title Pt will be increase R shoulder strength to 4+/5 with no increase in symtpoms.    Time 6    Period Weeks    Status New    Target Date 07/01/20      PT LONG TERM GOAL #4   Title Pt will report ability to sleep through night without increase in cervical pain    Time 6    Period Weeks    Status New    Target Date 07/01/20                 Plan - 06/01/20 1021     Clinical Impression Statement Pt tolerated progression of TE well with no reports of increased cervical pain with exercise. Pt required verbal and tactile cuing for TB ex's to avoid compensations. Pt reports IR exercise is more difficult on R side. Pt demos tenderness to B UT and limited cervical ROM.    PT Treatment/Interventions ADLs/Self Care Home Management;Cryotherapy;Electrical Stimulation;Ultrasound;Moist Heat;Iontophoresis 4mg /ml Dexamethasone;Therapeutic activities;Neuromuscular re-education;Manual techniques;Therapeutic exercise;Patient/family education;Passive range of motion;Dry needling;Taping    PT Next Visit Plan UE/ cervical strength/flexbility, manual/modalities as indicated    Consulted and Agree with Plan of Care Patient           Patient will benefit from skilled therapeutic intervention in order to improve the following deficits and impairments:  Decreased range of motion, Increased muscle spasms, Pain, Impaired flexibility, Decreased strength, Impaired UE functional use  Visit Diagnosis: Cramp and spasm  Muscle weakness (generalized)  Cervicalgia     Problem List Patient Active Problem List   Diagnosis Date Noted   Myalgia 06/01/2020   S/P partial thyroidectomy 09/08/2019   Presence of artificial right eye 06/06/2019   Degenerative arthritis of left knee 05/29/2019   Talar dome fracture 05/22/2019   Atypical mole 01/22/2018   Chronic diastolic (congestive) heart failure ([place]) 05/10/2017   DDD (degenerative disc disease), lumbar 01/10/2017   Arthritis of left acromioclavicular joint 11/07/2016   Goiter 03/17/2014   Routine health maintenance 10/19/2012   Elevated LDL cholesterol level 10/17/2012   Gout, unspecified 09/22/2008   Unspecified glaucoma 09/22/2008   Essential hypertension 07/12/2007   OSTEOARTHROSIS, LOCAL, PRIMARY, LOWER LEG 07/12/2007   Amador Cunas, PT, DPT Donald Prose [place] 06/01/2020, 10:23 AM  [address] [place], [place], [address] Phone: [phone]   Fax:  [phone]  Name: Jennifer Huang MRN: [id number] Date of Birth: [birth year]

## 2020-06-02 ENCOUNTER — Other Ambulatory Visit: Payer: Self-pay | Admitting: Internal Medicine

## 2020-06-02 DIAGNOSIS — H401122 Primary open-angle glaucoma, left eye, moderate stage: Secondary | ICD-10-CM | POA: Diagnosis not present

## 2020-06-02 DIAGNOSIS — R7989 Other specified abnormal findings of blood chemistry: Secondary | ICD-10-CM

## 2020-06-02 DIAGNOSIS — H1013 Acute atopic conjunctivitis, bilateral: Secondary | ICD-10-CM | POA: Diagnosis not present

## 2020-06-03 ENCOUNTER — Ambulatory Visit: Payer: Medicare Other | Admitting: Physical Therapy

## 2020-06-03 ENCOUNTER — Encounter: Payer: Self-pay | Admitting: Physical Therapy

## 2020-06-03 ENCOUNTER — Other Ambulatory Visit: Payer: Self-pay

## 2020-06-03 DIAGNOSIS — M6281 Muscle weakness (generalized): Secondary | ICD-10-CM

## 2020-06-03 DIAGNOSIS — R252 Cramp and spasm: Secondary | ICD-10-CM | POA: Diagnosis not present

## 2020-06-03 DIAGNOSIS — M542 Cervicalgia: Secondary | ICD-10-CM

## 2020-06-03 NOTE — Therapy (Signed)
[place], [place], [address] Phone: [phone]   Fax:  [phone]  Physical Therapy Treatment  Patient Details  Name: Jennifer Huang MRN: [id number] Date of Birth: 1951-02-18 Referring Provider (PT): Sharlet Salina   Encounter Date: 06/03/2020   PT End of Session - 06/03/20 1009    Visit Number 5    Date for PT Re-Evaluation 07/21/20    PT Start Time 0930    PT Stop Time 1010    PT Time Calculation (min) 40 min    Activity Tolerance Patient tolerated treatment well    Behavior During Therapy [facility] for tasks assessed/performed           Past Medical History:  Diagnosis Date  . Allergies   . Allergy    year around allergies  . Chronic diastolic (congestive) heart failure ([place]) 05/10/2017  . Colon polyps    anal polyp  . Gout, unspecified   . Hyperlipidemia   . Primary localized osteoarthrosis, lower leg   . Thyroid mass    right  . Unspecified essential hypertension   . Unspecified glaucoma(365.9)    Left Eye    Past Surgical History:  Procedure Laterality Date  . COLON SURGERY     anal polyp excised gen'l anesthesia  . COLONOSCOPY  10-14-2010   jacobs-hx TA 2006  . ENUCLEATION     OD 2nd to Glaucoma  . OOPHORECTOMY     Had Blockage  . SALPINGECTOMY     Had Blockage  . THYROIDECTOMY Right 09/08/2019   Procedure: RIGHT HEMI-THYROIDECTOMY;  Surgeon: Leta Baptist, MD;  Location: [place];  Service: ENT;  Laterality: Right;  . TOTAL ABDOMINAL HYSTERECTOMY  2002    There were no vitals filed for this visit.   Subjective Assessment - 06/03/20 0938    Subjective Pt reports neck is feeling better; R knee is feeling worse    Currently in Pain? Yes    Pain Score 1     Pain Location Neck    Pain Orientation Right              [facility] PT Assessment - 06/03/20 0001      AROM   Overall AROM Comments shoulder ROM pain at end range abduction and ER; mild limitation  in R functional ER/IR    Cervical Flexion 40    Cervical Extension 30    Cervical - Right Side Bend 25    Cervical - Left Side Bend 25    Cervical - Right Rotation 35    Cervical - Left Rotation 45                         [facility] Adult PT Treatment/Exercise - 06/03/20 0001      Neck Exercises: Machines for Strengthening   UBE (Upper Arm Bike) L 3 3 fwd/3back    Nustep L3 x 6 min      Neck Exercises: Theraband   Shoulder Extension 20 reps;Red    Rows 20 reps;Red    Shoulder External Rotation Red;10 reps    Shoulder Internal Rotation Red;10 reps                    PT Short Term Goals - 05/27/20 1001      PT SHORT TERM GOAL #1   Title Pt will demonstrate proper recall of HEP    Status Achieved  PT Long Term Goals - 05/20/20 1032      PT LONG TERM GOAL #1   Title Pt will demo B cervical ROM WFL in order to complete functional ADLs    Time 6    Period Weeks    Status New    Target Date 07/01/20      PT LONG TERM GOAL #2   Title Pt will be able to perform all household activities such as reaching into cabinets and overhead without limitation or pain.    Time 6    Period Weeks    Status New    Target Date 07/01/20      PT LONG TERM GOAL #3   Title Pt will be increase R shoulder strength to 4+/5 with no increase in symtpoms.    Time 6    Period Weeks    Status New    Target Date 07/01/20      PT LONG TERM GOAL #4   Title Pt will report ability to sleep through night without increase in cervical pain    Time 6    Period Weeks    Status New    Target Date 07/01/20                 Plan - 06/03/20 1009    Clinical Impression Statement Pt tolerated progression of TE well with no reports of increased cervical pain with exercise. Pt required verbal and tactile cuing for standing TB ex's to avoid compensations. Pt requested to drop down to 1x/week d/t having to manage appts with multiple providers.    PT  Treatment/Interventions ADLs/Self Care Home Management;Cryotherapy;Electrical Stimulation;Ultrasound;Moist Heat;Iontophoresis 4mg /ml Dexamethasone;Therapeutic activities;Neuromuscular re-education;Manual techniques;Therapeutic exercise;Patient/family education;Passive range of motion;Dry needling;Taping    PT Next Visit Plan UE/ cervical strength/flexbility, manual/modalities as indicated    Consulted and Agree with Plan of Care Patient           Patient will benefit from skilled therapeutic intervention in order to improve the following deficits and impairments:  Decreased range of motion, Increased muscle spasms, Pain, Impaired flexibility, Decreased strength, Impaired UE functional use  Visit Diagnosis: Cramp and spasm  Muscle weakness (generalized)  Cervicalgia     Problem List Patient Active Problem List   Diagnosis Date Noted  . Myalgia 06/01/2020  . S/P partial thyroidectomy 09/08/2019  . 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 Degenerative arthritis of left knee 05/29/2019  . Talar dome fracture 05/22/2019  . Atypical mole 01/22/2018  . Chronic diastolic (congestive) heart failure ([place]) 05/10/2017  . DDD (degenerative disc disease), lumbar 01/10/2017  . 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 Goiter 03/17/2014  . Routine health maintenance 10/19/2012  . Elevated LDL cholesterol level 10/17/2012  . Gout, unspecified 09/22/2008  . Unspecified glaucoma 09/22/2008  . Essential hypertension 07/12/2007  . OSTEOARTHROSIS, LOCAL, PRIMARY, LOWER LEG 07/12/2007   Amador Cunas, PT, DPT Donald Prose Edwena Mayorga 06/03/2020, 10:11 AM  [address] [place] [place], [place], [address] Phone: [phone]   Fax:  [phone]  Name: Jennifer Huang MRN: [id number] Date of Birth: 18-Jan-1951

## 2020-06-08 ENCOUNTER — Other Ambulatory Visit: Payer: Self-pay

## 2020-06-08 ENCOUNTER — Ambulatory Visit: Payer: Medicare Other | Admitting: Physical Therapy

## 2020-06-08 DIAGNOSIS — M542 Cervicalgia: Secondary | ICD-10-CM | POA: Diagnosis not present

## 2020-06-08 DIAGNOSIS — M6281 Muscle weakness (generalized): Secondary | ICD-10-CM

## 2020-06-08 DIAGNOSIS — R252 Cramp and spasm: Secondary | ICD-10-CM | POA: Diagnosis not present

## 2020-06-08 NOTE — Therapy (Signed)
Jennifer Huang, [place], [address] Phone: [phone]   Fax:  [phone]  Physical Therapy Treatment  Patient Details  Name: Jennifer Huang MRN: [id number] Date of Birth: Mar 02, 1951 Referring Provider (PT): Jennifer Huang Date: 06/08/2020   PT End of Session - 06/08/20 0957    Visit Number 6    Date for PT Re-Evaluation 07/21/20    PT Start Time 0930    PT Stop Time 1000    PT Time Calculation (min) 30 min           Past Medical History:  Diagnosis Date  . Allergies   . Allergy    year around allergies  . Chronic diastolic (congestive) heart failure ([place]) 05/10/2017  . Colon polyps    anal polyp  . Gout, unspecified   . Hyperlipidemia   . Primary localized osteoarthrosis, lower leg   . Thyroid mass    right  . Unspecified essential hypertension   . Unspecified glaucoma(365.9)    Left Eye    Past Surgical History:  Procedure Laterality Date  . COLON SURGERY     anal polyp excised gen'l anesthesia  . COLONOSCOPY  10-14-2010   jacobs-hx TA 2006  . ENUCLEATION     OD 2nd to Glaucoma  . OOPHORECTOMY     Had Blockage  . SALPINGECTOMY     Had Blockage  . THYROIDECTOMY Right 09/08/2019   Procedure: RIGHT HEMI-THYROIDECTOMY;  Surgeon: Jennifer Baptist, MD;  Location: [place];  Service: ENT;  Laterality: Right;  . TOTAL ABDOMINAL HYSTERECTOMY  2002    There were no vitals filed for this visit.   Subjective Assessment - 06/08/20 0931    Subjective neck is 100% better, I think we worked all the kinked outs and doing ex at home    Currently in Pain? No/denies              [facility] PT Assessment - 06/08/20 0001      AROM   Overall AROM Comments shld WFLs, cerv WFL limited end range rotation and SB BIL      Strength   Overall Strength Comments RT =Left shld 4+/5                         [facility] Adult PT Treatment/Exercise - 06/08/20 0001       Neck Exercises: Machines for Strengthening   UBE (Upper Arm Bike) L 3 3 fwd/3back    Power Tower row and shld ext 2 sets  5#      Neck Exercises: Standing   Neck Retraction 15 reps;3 secs   head on ball on wall   Wall Push Ups 15 reps    Other Standing Exercises 3# 2 shelf level BIL flexion 10 x, abd 10 each 3#   green tband ER 2 sets 10   Other Standing Exercises postural wall angels 10 x                  PT Education - 06/08/20 0956    Education Details reviewed HEP    Person(s) Educated Patient    Methods Demonstration    Comprehension Returned demonstration            PT Short Term Goals - 05/27/20 1001      PT SHORT TERM GOAL #1   Title Pt will demonstrate proper recall of HEP    Status  Achieved             PT Long Term Goals - 06/08/20 0932      PT LONG TERM GOAL #1   Title Pt will demo B cervical ROM WFL in order to complete functional ADLs    Status Achieved      PT LONG TERM GOAL #2   Title Pt will be able to perform all household activities such as reaching into cabinets and overhead without limitation or pain.    Status Achieved      PT LONG TERM GOAL #3   Title Pt will be increase R shoulder strength to 4+/5 with no increase in symtpoms.    Status Achieved      PT LONG TERM GOAL #4   Title Pt will report ability to sleep through night without increase in cervical pain    Status Achieved                 Plan - 06/08/20 0957    Clinical Impression Statement all goals met. pt able to demo HEP. no painn with session today just c/o fatigue in RT shld. some postural cuing still needed as pt tends to compensate at times    PT Treatment/Interventions ADLs/Self Care Home Management;Cryotherapy;Electrical Stimulation;Ultrasound;Moist Heat;Iontophoresis 51m/ml Dexamethasone;Therapeutic activities;Neuromuscular re-education;Manual techniques;Therapeutic exercise;Patient/family education;Passive range of motion;Dry needling;Taping    PT Next Visit  Plan if all is still well next session will D/C or place on hold, pt agrees           Patient will benefit from skilled therapeutic intervention in order to improve the following deficits and impairments:  Decreased range of motion, Increased muscle spasms, Pain, Impaired flexibility, Decreased strength, Impaired UE functional use  Visit Diagnosis: Muscle weakness (generalized)     Problem List Patient Active Problem List   Diagnosis Date Noted  . Myalgia 06/01/2020  . S/P partial thyroidectomy 09/08/2019  . 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 Degenerative arthritis of left knee 05/29/2019  . Talar dome fracture 05/22/2019  . Atypical mole 01/22/2018  . Chronic diastolic (congestive) heart failure ([place] 05/10/2017  . DDD (degenerative disc disease), lumbar 01/10/2017  . 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 Goiter 03/17/2014  . Routine health maintenance 10/19/2012  . Elevated LDL cholesterol level 10/17/2012  . Gout, unspecified 09/22/2008  . Unspecified glaucoma 09/22/2008  . Essential hypertension 07/12/2007  . OSTEOARTHROSIS, LOCAL, PRIMARY, LOWER LEG 07/12/2007    Jennifer Huang,Jennifer Huang 06/08/2020, 9:59 AM  [address] [address]: [phone]  Fax:  [phone] Name: [name]: [id number] of Birth: [birth year]

## 2020-06-11 ENCOUNTER — Encounter: Payer: Self-pay | Admitting: Family Medicine

## 2020-06-11 ENCOUNTER — Other Ambulatory Visit: Payer: Self-pay

## 2020-06-11 ENCOUNTER — Ambulatory Visit: Payer: Medicare Other | Admitting: Family Medicine

## 2020-06-11 DIAGNOSIS — M1712 Unilateral primary osteoarthritis, left knee: Secondary | ICD-10-CM | POA: Diagnosis not present

## 2020-06-11 NOTE — Assessment & Plan Note (Addendum)
Chronic problem with worsening symptoms.  Viscosupplementation given today.  Told patient how it is potentially going to work.  Discussed that it will be small.  Patient did have an effusion noted today and we did drain it.  Hopefully patient will do well follow-up with me again 2 to 3 months if this does not seem to work patient will need to consider the possibility of surgical intervention.  Patient did have a flare of steroid previously and we did warn her about the potential for a flare with the viscosupplementation as well.  We encouraged her to consider the potential for doing it on another date and set up a Friday.  Patient declined.  Patient knows of worsening symptoms to go to urgent care but I believe with the aspiration done today patient should do well.

## 2020-06-11 NOTE — Patient Instructions (Signed)
See me again in 2-3 months 

## 2020-06-11 NOTE — Progress Notes (Signed)
[place] [place] Phone: [phone] Subjective:   Jennifer Huang, am serving as a scribe for Dr. Hulan Saas. This visit occurred during the SARS-CoV-2 public health emergency.  Safety protocols were in place, including screening questions prior to the visit, additional usage of staff PPE, and extensive cleaning of exam room while observing appropriate contact time as indicated for disinfecting solutions.   I'm seeing this patient by the request  of:  Hoyt Koch, MD  CC: Arthritis of left knee follow-up  [id number]   03/09/2020 Significant improvement at this point.  Discussed which activities to do which wants to avoid.  Discussed icing regimen and home exercises.  Discussed compression sleeves.  Increasing instability we will consider the possibility of custom bracing but I believe the patient would be noncompliant at this time.  Patient is approved for viscosupplementation if necessary.  Follow-up again in 2 months total time with patient 31 minutes  Update 06/11/2020 Jennifer Huang is a 69 y.o. female coming in with complaint of left knee pain. Patient states that she had pain for 2 days after last injection. Is here for gel injection today. Having a hard time extending knee. Pain is constant.  Patient states that he did have a fall since we have seen her.  [address] and hit her head.  Has been evaluated and is doing relatively well with that.  Landed a little on her left knee and may be exacerbated a small amount but not severe      Past Medical History:  Diagnosis Date  . Allergies   . Allergy    year around allergies  . Chronic diastolic (congestive) heart failure ([place]) 05/10/2017  . Colon polyps    anal polyp  . Gout, unspecified   . Hyperlipidemia   . Primary localized osteoarthrosis, lower leg   . Thyroid mass    right  . Unspecified essential hypertension   . Unspecified glaucoma(365.9)     Left Eye   Past Surgical History:  Procedure Laterality Date  . COLON SURGERY     anal polyp excised gen'l anesthesia  . COLONOSCOPY  10-14-2010   jacobs-hx TA 2006  . ENUCLEATION     OD 2nd to Glaucoma  . OOPHORECTOMY     Had Blockage  . SALPINGECTOMY     Had Blockage  . THYROIDECTOMY Right 09/08/2019   Procedure: RIGHT HEMI-THYROIDECTOMY;  Surgeon: Leta Baptist, MD;  Location: [facility];  Service: ENT;  Laterality: Right;  . TOTAL ABDOMINAL HYSTERECTOMY  2002   Social History   Socioeconomic History  . Marital status: Widowed    Spouse name: Not on file  . Number of children: 0  . Years of education: Not on file  . Highest education level: Not on file  Occupational History  . 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 Smoking status: Never Smoker  . Smokeless tobacco: Never Used  Vaping Use  . Vaping Use: Never used  Substance and Sexual Activity  . Alcohol use: Huang    Alcohol/week: 0.0 standard drinks  . Drug use: Huang  . Sexual activity: Not Currently    Partners: Male    Birth control/protection: Surgical  Other Topics Concern  . Not on file  Social History Narrative   HSG, @ year college. Married '73. Huang children. On disability - orthopedic: knee, back, shortened leg.. SO - self-employed.  Dec '12 - many stressors: husband with cancer, brother with pancreatic cancer, housing and financial issues.             Social Determinants of Health   Financial Resource Strain:   . Difficulty of Paying Living Expenses: Not on file  Food Insecurity:   . Worried About Charity fundraiser in the Last Year: Not on file  . Ran Out of Food in the Last Year: Not on file  Transportation Needs:   . Lack of Transportation (Medical): Not on file  . Lack of Transportation (Non-Medical): Not on file  Physical Activity: Inactive  . Days of Exercise per Week: 0 days  . Minutes of Exercise per Session: 0 min    Stress:   . Feeling of Stress : Not on file  Social Connections:   . Frequency of Communication with Friends and Family: Not on file  . Frequency of Social Gatherings with Friends and Family: Not on file  . Attends Religious Services: Not on file  . Active Member of Clubs or Organizations: Not on file  . Attends Archivist Meetings: Not on file  . Marital Status: Not on file   Allergies  Allergen Reactions  . Penicillins     REACTION: rash/hives  . Sulfonamide Derivatives     REACTION: rash/hives   Family History  Problem Relation Age of Onset  . Osteoarthritis Mother   . Hypertension Mother   . Emphysema Father   . Diabetes Father   . Hypertension Father   . Arthritis Sister        hip replacments, wrist surgery  . Hyperlipidemia Sister   . Hypertension Sister   . Diabetes Brother   . Hypertension Brother   . Hyperlipidemia Brother   . Cancer - Other Brother   . Heart disease Brother   . Hypertension Brother   . Hyperlipidemia Brother   . Diabetes Brother   . Colon polyps Brother   . Diabetes Brother   . Heart disease Brother   . Hypertension Brother   . Hyperlipidemia Brother   . Colon polyps Brother   . Esophageal cancer Neg Hx   . Rectal cancer Neg Hx   . Stomach cancer Neg Hx      Current Outpatient Medications (Cardiovascular):  .  amLODipine (NORVASC) 10 MG tablet, TAKE 1 TABLET BY MOUTH  DAILY .  furosemide (LASIX) 20 MG tablet, TAKE 1 TABLET BY MOUTH  DAILY IF NEEDED  Current Outpatient Medications (Respiratory):  Marland Kitchen  Azelastine HCl 0.15 % SOLN, Place 2 sprays into the nose every 12 (twelve) hours as needed. .  fexofenadine (ALLEGRA) 180 MG tablet, Take 180 mg by mouth daily. .  montelukast (SINGULAIR) 10 MG tablet, Take 10 mg by mouth every evening.    Current Outpatient Medications (Other):  Marland Kitchen  Ascorbic Acid (VITAMIN C PO), Take by mouth. .  brimonidine (ALPHAGAN P) 0.1 % SOLN, 3 (three) times daily.   Marland Kitchen  CALCIUM PO, Take by  mouth. .  cyclobenzaprine (FLEXERIL) 5 MG tablet, Take 1 tablet (5 mg total) by mouth 3 (three) times daily as needed for muscle spasms. .  dorzolamide-timolol (COSOPT) 22.3-6.8 MG/ML ophthalmic solution, 1 drop 2 (two) times daily.   Marland Kitchen  lidocaine (LIDODERM) 5 %, Place 1 patch onto the skin daily. Remove & Discard patch within 12 hours or as directed by MD .  RHOPRESSA 0.02 % SOLN,  .  VITAMIN D PO, Take by mouth. .  VYZULTA 0.024 %  SOLN, instill 1 drop at bedtime into left eye   Reviewed prior external information including notes and imaging from  primary care provider As well as notes that were available from care everywhere and other healthcare systems.  Past medical history, social, surgical and family history all reviewed in electronic medical record.  Huang pertanent information unless stated regarding to the chief complaint.   Review of Systems:  Huang headache, visual changes, nausea, vomiting, diarrhea, constipation, dizziness, abdominal pain, skin rash, fevers, chills, night sweats, weight loss, swollen lymph nodes, body aches, joint swelling, chest pain, shortness of breath, mood changes. POSITIVE muscle aches  Objective  Blood pressure 116/72, pulse 79, height 5\' 6"  (1.676 m), weight 168 lb (76.2 kg), SpO2 97 %.   General: Huang apparent distress alert and oriented x3 mood and affect normal, dressed appropriately.  HEENT: Pupils asymmetric noted that seems to be patient's baseline Antalgic gait walking with the aid of a cane Knee: Left valgus deformity noted. Large thigh to calf ratio.  Small effusion noted Tender to palpation over medial and PF joint line.  ROM full in flexion and extension and lower leg rotation. instability with valgus force.  painful patellar compression. Patellar glide with severe crepitus. Patellar and quadriceps tendons unremarkable. Hamstring and quadriceps strength is normal.  After informed written and verbal consent, patient was seated on exam table.  Left knee was prepped with alcohol swab and utilizing anterolateral approach, patient's left knee space was injected with 60 mg per 3 mL of Durolane (sodium hyaluronate) in a prefilled syringe was injected easily into the knee through a 22-gauge needle after aspiration of 30 cc of straw-colored fluid..Patient tolerated the procedure well without immediate complications.   Impression and Recommendations:     The above documentation has been reviewed and is accurate and complete Lyndal Pulley, DO       Note: This dictation was prepared with Dragon dictation along with smaller phrase technology. Any transcriptional errors that result from this process are unintentional.

## 2020-06-15 ENCOUNTER — Ambulatory Visit: Payer: Medicare Other | Admitting: Family Medicine

## 2020-06-15 ENCOUNTER — Encounter: Payer: Self-pay | Admitting: Family Medicine

## 2020-06-15 ENCOUNTER — Other Ambulatory Visit: Payer: Self-pay

## 2020-06-15 ENCOUNTER — Ambulatory Visit (INDEPENDENT_AMBULATORY_CARE_PROVIDER_SITE_OTHER): Payer: Medicare Other

## 2020-06-15 ENCOUNTER — Telehealth: Payer: Self-pay | Admitting: Family Medicine

## 2020-06-15 ENCOUNTER — Ambulatory Visit: Payer: Self-pay

## 2020-06-15 ENCOUNTER — Ambulatory Visit: Payer: Medicare Other | Admitting: Physical Therapy

## 2020-06-15 ENCOUNTER — Encounter: Payer: Self-pay | Admitting: Physical Therapy

## 2020-06-15 VITALS — BP 130/70 | HR 63 | Ht 66.0 in | Wt 166.0 lb

## 2020-06-15 DIAGNOSIS — M6281 Muscle weakness (generalized): Secondary | ICD-10-CM | POA: Diagnosis not present

## 2020-06-15 DIAGNOSIS — G8929 Other chronic pain: Secondary | ICD-10-CM

## 2020-06-15 DIAGNOSIS — M542 Cervicalgia: Secondary | ICD-10-CM

## 2020-06-15 DIAGNOSIS — R252 Cramp and spasm: Secondary | ICD-10-CM | POA: Diagnosis not present

## 2020-06-15 DIAGNOSIS — M25462 Effusion, left knee: Secondary | ICD-10-CM | POA: Diagnosis not present

## 2020-06-15 DIAGNOSIS — M25562 Pain in left knee: Secondary | ICD-10-CM

## 2020-06-15 DIAGNOSIS — M1712 Unilateral primary osteoarthritis, left knee: Secondary | ICD-10-CM

## 2020-06-15 DIAGNOSIS — M109 Gout, unspecified: Secondary | ICD-10-CM | POA: Diagnosis not present

## 2020-06-15 MED ORDER — DOXYCYCLINE HYCLATE 100 MG PO TABS
100.0000 mg | ORAL_TABLET | Freq: Two times a day (BID) | ORAL | 0 refills | Status: DC
Start: 2020-06-15 — End: 2020-07-05

## 2020-06-15 MED ORDER — COLCHICINE 0.6 MG PO TABS
0.6000 mg | ORAL_TABLET | Freq: Every day | ORAL | 0 refills | Status: DC
Start: 2020-06-15 — End: 2020-08-18

## 2020-06-15 NOTE — Progress Notes (Signed)
[facility] [address] [place] Phone: [phone] Subjective:   I Jennifer Huang am serving as a Education administrator for Dr. Hulan Saas.  This visit occurred during the SARS-CoV-2 public health emergency.  Safety protocols were in place, including screening questions prior to the visit, additional usage of staff PPE, and extensive cleaning of exam room while observing appropriate contact time as indicated for disinfecting solutions.   I'm seeing this patient by the request  of:  Hoyt Koch, MD  CC: Left knee pain  [id number]   06/11/2020 Chronic problem with worsening symptoms.  Viscosupplementation given today.  Told patient how it is potentially going to work.  Discussed that it will be small.  Patient did have an effusion noted today and we did drain it.  Hopefully patient will do well follow-up with me again 2 to 3 months if this does not seem to work patient will need to consider the possibility of surgical intervention.  Patient did have a flare of steroid previously and we did warn her about the potential for a flare with the viscosupplementation as well.  We encouraged her to consider the potential for doing it on another date and set up a Friday.  Patient declined.  Patient knows of worsening symptoms to go to urgent care but I believe with the aspiration done today patient should do well.  06/15/2020 Jennifer Huang is a 69 y.o. female coming in with complaint of left knee pain. Patient states she feels pain in her patellar tendon. Knee flexion is painful. Patient is using crutches today. States she believes her knee will give out. Believes she is getting worse this time.  Left knee has been significantly more painful.  Did have the viscosupplementation last week.  States that she feels  like she is unable to bend the knee.  Seems to be more on the anterior aspect of the knee     Past Medical History:  Diagnosis Date  .  Allergies   . Allergy    year around allergies  . Chronic diastolic (congestive) heart failure ([place]) 05/10/2017  . Colon polyps    anal polyp  . Gout, unspecified   . Hyperlipidemia   . Primary localized osteoarthrosis, lower leg   . Thyroid mass    right  . Unspecified essential hypertension   . Unspecified glaucoma(365.9)    Left Eye   Past Surgical History:  Procedure Laterality Date  . COLON SURGERY     anal polyp excised gen'l anesthesia  . COLONOSCOPY  10-14-2010   jacobs-hx TA 2006  . ENUCLEATION     OD 2nd to Glaucoma  . OOPHORECTOMY     Had Blockage  . SALPINGECTOMY     Had Blockage  . THYROIDECTOMY Right 09/08/2019   Procedure: RIGHT HEMI-THYROIDECTOMY;  Surgeon: Leta Baptist, MD;  Location: [facility];  Service: ENT;  Laterality: Right;  . TOTAL ABDOMINAL HYSTERECTOMY  2002   Social History   Socioeconomic History  . Marital status: Widowed    Spouse name: Not on file  . Number of children: 0  . Years of education: Not on file  . Highest education level: Not on file  Occupational History  . 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 Smoking status: Never Smoker  . Smokeless tobacco: Never Used  Vaping Use  . Vaping Use: Never used  Substance and Sexual Activity  .  Alcohol use: No    Alcohol/week: 0.0 standard drinks  . Drug use: No  . Sexual activity: Not Currently    Partners: Male    Birth control/protection: Surgical  Other Topics Concern  . Not on file  Social History Narrative   HSG, @ year college. Married '73. No children. On disability - orthopedic: knee, back, shortened leg.. SO - self-employed.      Dec '12 - many stressors: husband with cancer, brother with pancreatic cancer, housing and financial issues.             Social Determinants of Health   Financial Resource Strain:   . Difficulty of Paying Living Expenses: Not on file  Food Insecurity:   . Worried About  Charity fundraiser in the Last Year: Not on file  . Ran Out of Food in the Last Year: Not on file  Transportation Needs:   . Lack of Transportation (Medical): Not on file  . Lack of Transportation (Non-Medical): Not on file  Physical Activity: Inactive  . Days of Exercise per Week: 0 days  . Minutes of Exercise per Session: 0 min  Stress:   . Feeling of Stress : Not on file  Social Connections:   . Frequency of Communication with Friends and Family: Not on file  . Frequency of Social Gatherings with Friends and Family: Not on file  . Attends Religious Services: Not on file  . Active Member of Clubs or Organizations: Not on file  . Attends Archivist Meetings: Not on file  . Marital Status: Not on file   Allergies  Allergen Reactions  . Penicillins     REACTION: rash/hives  . Sulfonamide Derivatives     REACTION: rash/hives   Family History  Problem Relation Age of Onset  . Osteoarthritis Mother   . Hypertension Mother   . Emphysema Father   . Diabetes Father   . Hypertension Father   . Arthritis Sister        hip replacments, wrist surgery  . Hyperlipidemia Sister   . Hypertension Sister   . Diabetes Brother   . Hypertension Brother   . Hyperlipidemia Brother   . Cancer - Other Brother   . Heart disease Brother   . Hypertension Brother   . Hyperlipidemia Brother   . Diabetes Brother   . Colon polyps Brother   . Diabetes Brother   . Heart disease Brother   . Hypertension Brother   . Hyperlipidemia Brother   . Colon polyps Brother   . Esophageal cancer Neg Hx   . Rectal cancer Neg Hx   . Stomach cancer Neg Hx      Current Outpatient Medications (Cardiovascular):  .  amLODipine (NORVASC) 10 MG tablet, TAKE 1 TABLET BY MOUTH  DAILY .  furosemide (LASIX) 20 MG tablet, TAKE 1 TABLET BY MOUTH  DAILY IF NEEDED  Current Outpatient Medications (Respiratory):  Marland Kitchen  Azelastine HCl 0.15 % SOLN, Place 2 sprays into the nose every 12 (twelve) hours as needed. .   fexofenadine (ALLEGRA) 180 MG tablet, Take 180 mg by mouth daily. .  montelukast (SINGULAIR) 10 MG tablet, Take 10 mg by mouth every evening.  Current Outpatient Medications (Analgesics):  .  colchicine 0.6 MG tablet, Take 1 tablet (0.6 mg total) by mouth daily.   Current Outpatient Medications (Other):  Marland Kitchen  Ascorbic Acid (VITAMIN C PO), Take by mouth. .  brimonidine (ALPHAGAN P) 0.1 % SOLN, 3 (three) times daily.   Marland Kitchen  CALCIUM PO, Take by mouth. .  cyclobenzaprine (FLEXERIL) 5 MG tablet, Take 1 tablet (5 mg total) by mouth 3 (three) times daily as needed for muscle spasms. .  dorzolamide-timolol (COSOPT) 22.3-6.8 MG/ML ophthalmic solution, 1 drop 2 (two) times daily.   Marland Kitchen  lidocaine (LIDODERM) 5 %, Place 1 patch onto the skin daily. Remove & Discard patch within 12 hours or as directed by MD .  RHOPRESSA 0.02 % SOLN,  .  VITAMIN D PO, Take by mouth. .  VYZULTA 0.024 % SOLN, instill 1 drop at bedtime into left eye .  doxycycline (VIBRA-TABS) 100 MG tablet, Take 1 tablet (100 mg total) by mouth 2 (two) times daily.   Reviewed prior external information including notes and imaging from  primary care provider As well as notes that were available from care everywhere and other healthcare systems.  Past medical history, social, surgical and family history all reviewed in electronic medical record.  No pertanent information unless stated regarding to the chief complaint.   Review of Systems:  No headache, visual changes, nausea, vomiting, diarrhea, constipation, dizziness, abdominal pain, skin rash, fevers, chills, night sweats, weight loss, swollen lymph nodes, body aches, joint swelling, chest pain, shortness of breath, mood changes. POSITIVE muscle aches  Objective  Blood pressure 130/70, pulse 63, height 5\' 6"  (1.676 m), weight 166 lb (75.3 kg), SpO2 97 %.   General: No apparent distress alert and oriented x3 mood and affect normal, dressed appropriately.  HEENT: Pupils equal,  extraocular movements intact  Respiratory: Patient's speak in full sentences and does not appear short of breath  Severely antalgic.  Patient is favoring the left knee and using a crutch for some stability.  Decreased range of motion of the knee noted and does have an effusion.  Very mild warmth to palpation but no erythema.  Limited musculoskeletal ultrasound was performed and interpreted by Lyndal Pulley  Knee effusion noted.  Patient does have what appears to be more gouty deposits in the knee.  No cellulitic changes noted in the area.  Significant synovitis noted.  Underlying arthritic changes still noted  Procedure: Real-time Ultrasound Guided Injection of left knee Device: GE Logiq Q7 Ultrasound guided injection is preferred based studies that show increased duration, increased effect, greater accuracy, decreased procedural pain, increased response rate, and decreased cost with ultrasound guided versus blind injection.  Verbal informed consent obtained.  Time-out conducted.  Noted no overlying erythema, induration, or other signs of local infection.  Skin prepped in a sterile fashion.  Local anesthesia: Topical Ethyl chloride.  With sterile technique and under real time ultrasound guidance: With a 22-gauge 2 inch needle patient was injected with 4 cc of 0.5% Marcaine and aspirated 35 cc of straw-colored colored fluid that does have some mild thickness in the viscosity then injected 0.5 cc of Kenalog 40 mg/dL. This was from a superior lateral approach.  Completed without difficulty  Pain immediately resolved suggesting accurate placement of the medication.  Advised to call if fevers/chills, erythema, induration, drainage, or persistent bleeding.  Images permanently stored and available for review in the ultrasound unit.  Impression: Technically successful ultrasound guided injection.    Impression and Recommendations:     The above documentation has been reviewed and is accurate and  complete Lyndal Pulley, DO       Note: This dictation was prepared with Dragon dictation along with smaller phrase technology. Any transcriptional errors that result from this process are unintentional.

## 2020-06-15 NOTE — Therapy (Signed)
[place], [place], [address] Phone: [phone]   Fax:  [phone]  Physical Therapy Treatment  Patient Details  Name: Jennifer Huang MRN: [id number] Date of Birth: [birth year] Referring Provider (PT): Sharlet Salina   Encounter Date: 06/15/2020   PT End of Session - 06/15/20 1018    Visit Number 7    Date for PT Re-Evaluation 07/21/20    PT Start Time 0940    PT Stop Time 1015    PT Time Calculation (min) 35 min    Activity Tolerance Patient tolerated treatment well    Behavior During Therapy [facility] for tasks assessed/performed           Past Medical History:  Diagnosis Date  . Allergies   . Allergy    year around allergies  . Chronic diastolic (congestive) heart failure ([place]) 05/10/2017  . Colon polyps    anal polyp  . Gout, unspecified   . Hyperlipidemia   . Primary localized osteoarthrosis, lower leg   . Thyroid mass    right  . Unspecified essential hypertension   . Unspecified glaucoma(365.9)    Left Eye    Past Surgical History:  Procedure Laterality Date  . COLON SURGERY     anal polyp excised gen'l anesthesia  . COLONOSCOPY  10-14-2010   jacobs-hx TA 2006  . ENUCLEATION     OD 2nd to Glaucoma  . OOPHORECTOMY     Had Blockage  . SALPINGECTOMY     Had Blockage  . THYROIDECTOMY Right 09/08/2019   Procedure: RIGHT HEMI-THYROIDECTOMY;  Surgeon: Leta Baptist, MD;  Location: [facility];  Service: ENT;  Laterality: Right;  . TOTAL ABDOMINAL HYSTERECTOMY  2002    There were no vitals filed for this visit.   Subjective Assessment - 06/15/20 0944    Subjective Pt reports neck is much better overall. States that she has mild pain intermittently; wants to be placed on hold and will call back if she has additional problems. Reports that her knee is hurting a lot today following an injection/fluid removal at MD.    Currently in Pain? No/denies    Pain Location Neck                              [facility] Adult PT Treatment/Exercise - 06/15/20 0001      Neck Exercises: Machines for Strengthening   UBE (Upper Arm Bike) L 3 3 fwd/3back      Neck Exercises: Theraband   Shoulder Extension 20 reps;Red    Rows 20 reps;Red      Neck Exercises: Seated   Shoulder Rolls 5 reps;Backwards;Forwards    Shoulder Flexion 10 reps    Shoulder Flexion Weights (lbs) 2    Shoulder ABduction 10 reps    Shoulder Abduction Weights (lbs) 2    Other Seated Exercise yellow ball overhead raise 2x5, shoulder press 2x5; chest press 2x5                    PT Short Term Goals - 05/27/20 1001      PT SHORT TERM GOAL #1   Title Pt will demonstrate proper recall of HEP    Status Achieved             PT Long Term Goals - 06/08/20 0932      PT LONG TERM GOAL #1   Title Pt will  demo B cervical ROM WFL in order to complete functional ADLs    Status Achieved      PT LONG TERM GOAL #2   Title Pt will be able to perform all household activities such as reaching into cabinets and overhead without limitation or pain.    Status Achieved      PT LONG TERM GOAL #3   Title Pt will be increase R shoulder strength to 4+/5 with no increase in symtpoms.    Status Achieved      PT LONG TERM GOAL #4   Title Pt will report ability to sleep through night without increase in cervical pain    Status Achieved                 Plan - 06/15/20 1018    Clinical Impression Statement Pt arrived late to session today; had difficulty getting out of house d/t pain in knee following injection and fluid removal. Performed all ex's in seated today d/t complaints of knee pain. Pt did well with cervical/shoulder ex's. Required cuing for form. Pt feels much better overall and would like to be put on hold for PT; educated on when to call back if symptoms worsen or recur.    PT Treatment/Interventions ADLs/Self Care Home Management;Cryotherapy;Electrical  Stimulation;Ultrasound;Moist Heat;Iontophoresis 4mg /ml Dexamethasone;Therapeutic activities;Neuromuscular re-education;Manual techniques;Therapeutic exercise;Patient/family education;Passive range of motion;Dry needling;Taping    PT Next Visit Plan place on hold    Consulted and Agree with Plan of Care Patient           Patient will benefit from skilled therapeutic intervention in order to improve the following deficits and impairments:  Decreased range of motion, Increased muscle spasms, Pain, Impaired flexibility, Decreased strength, Impaired UE functional use  Visit Diagnosis: Muscle weakness (generalized)  Cramp and spasm  Cervicalgia     Problem List Patient Active Problem List   Diagnosis Date Noted  . Myalgia 06/01/2020  . S/P partial thyroidectomy 09/08/2019  . 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 Degenerative arthritis of left knee 05/29/2019  . Talar dome fracture 05/22/2019  . Atypical mole 01/22/2018  . Chronic diastolic (congestive) heart failure ([place]) 05/10/2017  . DDD (degenerative disc disease), lumbar 01/10/2017  . 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 Goiter 03/17/2014  . Routine health maintenance 10/19/2012  . Elevated LDL cholesterol level 10/17/2012  . Gout, unspecified 09/22/2008  . Unspecified glaucoma 09/22/2008  . Essential hypertension 07/12/2007  . OSTEOARTHROSIS, LOCAL, PRIMARY, LOWER LEG 07/12/2007   Amador Cunas, PT, DPT Donald Prose Jomarion Mish 06/15/2020, 10:20 AM  [place] [address] [place], [place], [address] Phone: [phone]   Fax:  [phone]  Name: Jennifer Huang MRN: [id number] Date of Birth: 04-10-1951

## 2020-06-15 NOTE — Patient Instructions (Addendum)
Good to see you Drained the knee Xray of the knee today Colchicine daily for 5 days Doxy 100 mg 2 times a day for 7 days See me again in 2 weeks ok to double book

## 2020-06-15 NOTE — Telephone Encounter (Signed)
Spoke with patient and she would like to come in. Scheduled this afternoon.

## 2020-06-15 NOTE — Assessment & Plan Note (Signed)
Patient did have the viscosupplementation but does have more of a knee effusion.  Patient does have signs and symptoms that is more consistent with gout flare.  Patient given a colchicine, as well as send the fluid for analysis.  Patient felt significantly better and was able to ambulate significantly better on her way out.  X-rays of the knee ordered today as well.  Follow-up with me again in 2 weeks

## 2020-06-15 NOTE — Telephone Encounter (Signed)
Patient called stating that she was here on Friday, 06/11/2020 and had an injection in her knee. Since then the pain has gotten worse and her knee feels like is it going to give out on her. She has started using crutches because of it.  Please advise on what she should do.

## 2020-06-16 LAB — SYNOVIAL CELL COUNT + DIFF, W/ CRYSTALS
Basophils, %: 0 %
Eosinophils-Synovial: 0 % (ref 0–2)
Lymphocytes-Synovial Fld: 81 % — ABNORMAL HIGH (ref 0–74)
Monocyte/Macrophage: 0 % (ref 0–69)
Neutrophil, Synovial: 9 % (ref 0–24)
Synoviocytes, %: 0 % (ref 0–15)
WBC, Synovial: 199 cells/uL — ABNORMAL HIGH (ref <150)

## 2020-06-16 LAB — TIQ-NTM

## 2020-06-21 DIAGNOSIS — D440 Neoplasm of uncertain behavior of thyroid gland: Secondary | ICD-10-CM | POA: Diagnosis not present

## 2020-06-22 ENCOUNTER — Ambulatory Visit: Payer: Medicare Other | Admitting: Physical Therapy

## 2020-06-28 NOTE — Progress Notes (Signed)
[facility] [address] [place] Phone: [phone] Subjective:   I Jennifer Huang am serving as a Education administrator for Dr. Hulan Saas.  This visit occurred during the SARS-CoV-2 public health emergency.  Safety protocols were in place, including screening questions prior to the visit, additional usage of staff PPE, and extensive cleaning of exam room while observing appropriate contact time as indicated for disinfecting solutions.   I'm seeing this patient by the request  of:  Hoyt Koch, MD  CC: Left knee pain follow-up  UTM:[id number]   06/15/2020 Patient did have the viscosupplementation but does have more of a knee effusion.  Patient does have signs and symptoms that is more consistent with gout flare.  Patient given a colchicine, as well as send the fluid for analysis.  Patient felt significantly better and was able to ambulate significantly better on her way out.  X-rays of the knee ordered today as well.  Follow-up with me again in 2 weeks  06/29/2020 Jennifer Huang is a 68 y.o. female coming in with complaint of left knee pain. Patient states she was feeling better until this morning. Knee is sore. States she tries to exercise the knee.  Patient feels like she is making some progress.  Is feeling better than when she had prior to the viscosupplementation now.  Still having some instability from time to time     Past Medical History:  Diagnosis Date  . Allergies   . Allergy    year around allergies  . Chronic diastolic (congestive) heart failure ([place]) 05/10/2017  . Colon polyps    anal polyp  . Gout, unspecified   . Hyperlipidemia   . Primary localized osteoarthrosis, lower leg   . Thyroid mass    right  . Unspecified essential hypertension   . Unspecified glaucoma(365.9)    Left Eye   Past Surgical History:  Procedure Laterality Date  . COLON SURGERY     anal polyp excised gen'l anesthesia  . COLONOSCOPY   10-14-2010   jacobs-hx TA 2006  . ENUCLEATION     OD 2nd to Glaucoma  . OOPHORECTOMY     Had Blockage  . SALPINGECTOMY     Had Blockage  . THYROIDECTOMY Right 09/08/2019   Procedure: RIGHT HEMI-THYROIDECTOMY;  Surgeon: Leta Baptist, MD;  Location: [facility];  Service: ENT;  Laterality: Right;  . TOTAL ABDOMINAL HYSTERECTOMY  2002   Social History   Socioeconomic History  . Marital status: Widowed    Spouse name: Not on file  . Number of children: 0  . Years of education: Not on file  . Highest education level: Not on file  Occupational History  . 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 Smoking status: Never Smoker  . Smokeless tobacco: Never Used  Vaping Use  . Vaping Use: Never used  Substance and Sexual Activity  . Alcohol use: No    Alcohol/week: 0.0 standard drinks  . Drug use: No  . Sexual activity: Not Currently    Partners: Male    Birth control/protection: Surgical  Other Topics Concern  . Not on file  Social History Narrative   HSG, @ year college. Married '73. No children. On disability - orthopedic: knee, back, shortened leg.. SO - self-employed.      Dec '12 - many stressors: husband with cancer, brother with pancreatic cancer, housing and financial issues.  Social Determinants of Health   Financial Resource Strain:   . Difficulty of Paying Living Expenses: Not on file  Food Insecurity:   . Worried About Charity fundraiser in the Last Year: Not on file  . Ran Out of Food in the Last Year: Not on file  Transportation Needs:   . Lack of Transportation (Medical): Not on file  . Lack of Transportation (Non-Medical): Not on file  Physical Activity: Inactive  . Days of Exercise per Week: 0 days  . Minutes of Exercise per Session: 0 min  Stress:   . Feeling of Stress : Not on file  Social Connections:   . Frequency of Communication with Friends and Family: Not on file  .  Frequency of Social Gatherings with Friends and Family: Not on file  . Attends Religious Services: Not on file  . Active Member of Clubs or Organizations: Not on file  . Attends Archivist Meetings: Not on file  . Marital Status: Not on file   Allergies  Allergen Reactions  . Penicillins     REACTION: rash/hives  . Sulfonamide Derivatives     REACTION: rash/hives   Family History  Problem Relation Age of Onset  . Osteoarthritis Mother   . Hypertension Mother   . Emphysema Father   . Diabetes Father   . Hypertension Father   . Arthritis Sister        hip replacments, wrist surgery  . Hyperlipidemia Sister   . Hypertension Sister   . Diabetes Brother   . Hypertension Brother   . Hyperlipidemia Brother   . Cancer - Other Brother   . Heart disease Brother   . Hypertension Brother   . Hyperlipidemia Brother   . Diabetes Brother   . Colon polyps Brother   . Diabetes Brother   . Heart disease Brother   . Hypertension Brother   . Hyperlipidemia Brother   . Colon polyps Brother   . Esophageal cancer Neg Hx   . Rectal cancer Neg Hx   . Stomach cancer Neg Hx      Current Outpatient Medications (Cardiovascular):  .  amLODipine (NORVASC) 10 MG tablet, TAKE 1 TABLET BY MOUTH  DAILY .  furosemide (LASIX) 20 MG tablet, TAKE 1 TABLET BY MOUTH  DAILY IF NEEDED  Current Outpatient Medications (Respiratory):  Marland Kitchen  Azelastine HCl 0.15 % SOLN, Place 2 sprays into the nose every 12 (twelve) hours as needed. .  fexofenadine (ALLEGRA) 180 MG tablet, Take 180 mg by mouth daily. .  montelukast (SINGULAIR) 10 MG tablet, Take 10 mg by mouth every evening.  Current Outpatient Medications (Analgesics):  .  colchicine 0.6 MG tablet, Take 1 tablet (0.6 mg total) by mouth daily.   Current Outpatient Medications (Other):  Marland Kitchen  Ascorbic Acid (VITAMIN C PO), Take by mouth. .  brimonidine (ALPHAGAN P) 0.1 % SOLN, 3 (three) times daily.   Marland Kitchen  CALCIUM PO, Take by mouth. .  cyclobenzaprine  (FLEXERIL) 5 MG tablet, Take 1 tablet (5 mg total) by mouth 3 (three) times daily as needed for muscle spasms. .  dorzolamide-timolol (COSOPT) 22.3-6.8 MG/ML ophthalmic solution, 1 drop 2 (two) times daily.   Marland Kitchen  doxycycline (VIBRA-TABS) 100 MG tablet, Take 1 tablet (100 mg total) by mouth 2 (two) times daily. Marland Kitchen  lidocaine (LIDODERM) 5 %, Place 1 patch onto the skin daily. Remove & Discard patch within 12 hours or as directed by MD .  RHOPRESSA 0.02 % SOLN,  .  VITAMIN D PO, Take by mouth. .  VYZULTA 0.024 % SOLN, instill 1 drop at bedtime into left eye   Reviewed prior external information including notes and imaging from  primary care provider As well as notes that were available from care everywhere and other healthcare systems.  Past medical history, social, surgical and family history all reviewed in electronic medical record.  No pertanent information unless stated regarding to the chief complaint.   Review of Systems:  No headache, visual changes, nausea, vomiting, diarrhea, constipation, dizziness, abdominal pain, skin rash, fevers, chills, night sweats, weight loss, swollen lymph nodes, body aches, joint swelling, chest pain, shortness of breath, mood changes. POSITIVE muscle aches  Objective  Blood pressure 120/78, pulse 63, height 5\' 6"  (1.676 m), weight 165 lb (74.8 kg), SpO2 98 %.   General: No apparent distress alert and oriented x3 mood and affect normal, dressed appropriately.  HEENT: Pupils equal, extraocular movements intact  Respiratory: Patient's speak in full sentences and does not appear short of breath   Ambulating without the aid of a cane today.  Patient has significant decrease in the swelling.  Knee exam does have full range of motion which is an improvement.  Still has instability with valgus and varus force.    Impression and Recommendations:     The above documentation has been reviewed and is accurate and complete Lyndal Pulley, DO       Note:  This dictation was prepared with Dragon dictation along with smaller phrase technology. Any transcriptional errors that result from this process are unintentional.

## 2020-06-29 ENCOUNTER — Other Ambulatory Visit: Payer: Self-pay

## 2020-06-29 ENCOUNTER — Encounter: Payer: Self-pay | Admitting: Family Medicine

## 2020-06-29 ENCOUNTER — Ambulatory Visit: Payer: Medicare Other | Admitting: Family Medicine

## 2020-06-29 DIAGNOSIS — M1712 Unilateral primary osteoarthritis, left knee: Secondary | ICD-10-CM | POA: Diagnosis not present

## 2020-06-29 DIAGNOSIS — M109 Gout, unspecified: Secondary | ICD-10-CM | POA: Diagnosis not present

## 2020-06-29 NOTE — Patient Instructions (Addendum)
Good to see you Keep using compression Ice after a lot of activity Do not lace last eye on shoe  Buy new shoes when you can See me again in 6-8 weeks

## 2020-06-29 NOTE — Assessment & Plan Note (Signed)
Patient did have a flare.  We will continue with the medications at this moment.  If necessary may need to do higher dose of either colchicine daily or allopurinol or Uloric.  Follow-up with me again as scheduled

## 2020-06-29 NOTE — Assessment & Plan Note (Signed)
Significant improvement now after the aspiration.  Does have some findings that were more consistent with gout.  We discussed continuing the medications and follow-up with me again in 3 to 4 weeks

## 2020-06-30 ENCOUNTER — Ambulatory Visit: Payer: Medicare Other | Admitting: Family Medicine

## 2020-07-01 ENCOUNTER — Ambulatory Visit: Payer: Medicare Other | Admitting: Family Medicine

## 2020-07-01 NOTE — Progress Notes (Deleted)
   I, [name], LAT, ATC, am serving as scribe for Dr. [name].  Jennifer Huang is a 69 y.o. female who presents to [facility] at [facility] today for knee and elbow pain after suffering a fall on .  She was last seen by Dr. Tamala Julian on 06/29/20 for f/u of her L knee pain.  She had L knee Durolane injection on 06/11/20.  Since her last visit w/ Dr. Tamala Julian, she   Diagnostic imaging: L knee XR- 06/15/20  Pertinent review of systems: ***  Relevant historical information: ***   Exam:  There were no vitals taken for this visit. General: Well Developed, well nourished, and in no acute distress.   MSK: ***    Lab and Radiology Results No results found for this or any previous visit (from the past 72 hour(s)). No results found.     Assessment and Plan: 69 y.o. female with ***   PDMP not reviewed this encounter. No orders of the defined types were placed in this encounter.  No orders of the defined types were placed in this encounter.    Discussed warning signs or symptoms. Please see discharge instructions. Patient expresses understanding.   ***

## 2020-07-05 ENCOUNTER — Other Ambulatory Visit: Payer: Self-pay

## 2020-07-05 ENCOUNTER — Ambulatory Visit (INDEPENDENT_AMBULATORY_CARE_PROVIDER_SITE_OTHER): Payer: Medicare Other | Admitting: Family

## 2020-07-05 ENCOUNTER — Ambulatory Visit (INDEPENDENT_AMBULATORY_CARE_PROVIDER_SITE_OTHER)
Admission: RE | Admit: 2020-07-05 | Discharge: 2020-07-05 | Disposition: A | Discharge status: Home / Self Care | Payer: Medicare Other | Source: Ambulatory Visit | Attending: Family | Admitting: Family

## 2020-07-05 VITALS — BP 124/68 | HR 67 | Temp 98.4°F | Ht 66.0 in | Wt 164.0 lb

## 2020-07-05 DIAGNOSIS — M79662 Pain in left lower leg: Secondary | ICD-10-CM | POA: Diagnosis not present

## 2020-07-05 DIAGNOSIS — M25572 Pain in left ankle and joints of left foot: Secondary | ICD-10-CM | POA: Diagnosis not present

## 2020-07-05 DIAGNOSIS — M25522 Pain in left elbow: Secondary | ICD-10-CM | POA: Diagnosis not present

## 2020-07-05 DIAGNOSIS — Z23 Encounter for immunization: Secondary | ICD-10-CM

## 2020-07-05 DIAGNOSIS — M79605 Pain in left leg: Secondary | ICD-10-CM

## 2020-07-05 NOTE — Progress Notes (Signed)
Jennifer Huang is a 69 y.o. female with the following history as recorded in EpicCare:  Patient Active Problem List   Diagnosis Date Noted  . Myalgia 06/01/2020  . S/P partial thyroidectomy 09/08/2019  . 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 Degenerative arthritis of left knee 05/29/2019  . Talar dome fracture 05/22/2019  . Atypical mole 01/22/2018  . Chronic diastolic (congestive) heart failure ([place]) 05/10/2017  . DDD (degenerative disc disease), lumbar 01/10/2017  . 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 Goiter 03/17/2014  . Routine health maintenance 10/19/2012  . Elevated LDL cholesterol level 10/17/2012  . Gout, unspecified 09/22/2008  . Unspecified glaucoma 09/22/2008  . Essential hypertension 07/12/2007  . OSTEOARTHROSIS, LOCAL, PRIMARY, LOWER LEG 07/12/2007    Current Outpatient Medications  Medication Sig Dispense Refill  . amLODipine (NORVASC) 10 MG tablet TAKE 1 TABLET BY MOUTH  DAILY 90 tablet 0  . Ascorbic Acid (VITAMIN C PO) Take by mouth.    . Azelastine HCl 0.15 % SOLN Place 2 sprays into the nose every 12 (twelve) hours as needed. 30 mL 2  . brimonidine (ALPHAGAN P) 0.1 % SOLN 3 (three) times daily.      Marland Kitchen CALCIUM PO Take by mouth.    . cyclobenzaprine (FLEXERIL) 5 MG tablet Take 1 tablet (5 mg total) by mouth 3 (three) times daily as needed for muscle spasms. 30 tablet 1  . dorzolamide-timolol (COSOPT) 22.3-6.8 MG/ML ophthalmic solution 1 drop 2 (two) times daily.      . fexofenadine (ALLEGRA) 180 MG tablet Take 180 mg by mouth daily.    . furosemide (LASIX) 20 MG tablet TAKE 1 TABLET BY MOUTH  DAILY IF NEEDED 90 tablet 0  . lidocaine (LIDODERM) 5 % Place 1 patch onto the skin daily. Remove & Discard patch within 12 hours or as directed by MD 30 patch 0  . montelukast (SINGULAIR) 10 MG tablet Take 10 mg by mouth every evening.  0  . ROCKLATAN 0.02-0.005 % SOLN     . VITAMIN D PO Take by mouth.    . colchicine 0.6 MG tablet Take 1  tablet (0.6 mg total) by mouth daily. (Patient not taking: Reported on 07/05/2020) 5 tablet 0   No current facility-administered medications for this visit.    Allergies: Penicillins and Sulfonamide derivatives  Past Medical History:  Diagnosis Date  . Allergies   . Allergy    year around allergies  . Chronic diastolic (congestive) heart failure ([place]) 05/10/2017  . Colon polyps    anal polyp  . Gout, unspecified   . Hyperlipidemia   . Primary localized osteoarthrosis, lower leg   . Thyroid mass    right  . Unspecified essential hypertension   . Unspecified glaucoma(365.9)    Left Eye    Past Surgical History:  Procedure Laterality Date  . COLON SURGERY     anal polyp excised gen'l anesthesia  . COLONOSCOPY  10-14-2010   jacobs-hx TA 2006  . ENUCLEATION     OD 2nd to Glaucoma  . OOPHORECTOMY     Had Blockage  . SALPINGECTOMY     Had Blockage  . THYROIDECTOMY Right 09/08/2019   Procedure: RIGHT HEMI-THYROIDECTOMY;  Surgeon: Leta Baptist, MD;  Location: [facility];  Service: ENT;  Laterality: Right;  . TOTAL ABDOMINAL HYSTERECTOMY  2002    Family History  Problem Relation Age of Onset  . Osteoarthritis Mother   . Hypertension Mother   . Emphysema Father   .  Diabetes Father   . Hypertension Father   . Arthritis Sister        hip replacments, wrist surgery  . Hyperlipidemia Sister   . Hypertension Sister   . Diabetes Brother   . Hypertension Brother   . Hyperlipidemia Brother   . Cancer - Other Brother   . Heart disease Brother   . Hypertension Brother   . Hyperlipidemia Brother   . Diabetes Brother   . Colon polyps Brother   . Diabetes Brother   . Heart disease Brother   . Hypertension Brother   . Hyperlipidemia Brother   . Colon polyps Brother   . Esophageal cancer Neg Hx   . Rectal cancer Neg Hx   . Stomach cancer Neg Hx     Social History   Tobacco Use  . Smoking status: Never Smoker  . Smokeless tobacco: Never Used  Substance Use Topics   . Alcohol use: No    Alcohol/week: 0.0 standard drinks    Subjective:  Patient tripped in her home last week while stepping over a baby gate; landed on her left side- very concerned about bruising over left elbow and left lower leg; has had tibia fracture in the past and wants to get x-ray to make sure there is no fracture; has been treating bruises and feels they are healing appropriately;   Objective:  Vitals:   07/05/20 1347  BP: 124/68  Pulse: 67  Temp: 98.4 F (36.9 C)  TempSrc: Oral  SpO2: 97%  Weight: 164 lb (74.4 kg)  Height: 5\' 6"  (1.676 m)    General: Well developed, well nourished, in no acute distress  Skin : Warm and dry. Extensive bruising noted over inner lower left leg- no hematoma noted Head: Normocephalic and atraumatic  Lungs: Respirations unlabored;  Musculoskeletal: No deformities; no active joint inflammation  Extremities: No edema, cyanosis, clubbing  Vessels: Symmetric bilaterally  Neurologic: Alert and oriented; speech intact; face symmetrical; moves all extremities well; CNII-XII intact without focal deficit   Assessment:  1. Left leg pain   2. Acute left ankle pain   3. Left elbow pain   4. Needs flu shot     Plan:  Reassurance that bruising will heal and she is in agreement; update X-rays to rule out any fractures; follow up to be determined;  Flu shot given;   This visit occurred during the SARS-CoV-2 public health emergency.  Safety protocols were in place, including screening questions prior to the visit, additional usage of staff PPE, and extensive cleaning of exam room while observing appropriate contact time as indicated for disinfecting solutions.     No follow-ups on file.  Orders Placed This Encounter  Procedures  . DG Elbow Complete Left    Standing Status:   Future    Number of Occurrences:   1    Standing Expiration Date:   07/05/2021    Order Specific Question:   Reason for Exam (SYMPTOM  OR DIAGNOSIS REQUIRED)    Answer:    fall/ bruising    Order Specific Question:   Preferred imaging location?    Answer:   [facility]    Order Specific Question:   Radiology Contrast Protocol - do NOT remove file path    Answer:   [email or web address]  . DG Ankle Complete Left    Standing Status:   Future    Number of Occurrences:   1    Standing Expiration Date:   07/05/2021    Order Specific  Question:   Reason for Exam (SYMPTOM  OR DIAGNOSIS REQUIRED)    Answer:   pain/ fall    Order Specific Question:   Preferred imaging location?    Answer:   [facility]    Order Specific Question:   Radiology Contrast Protocol - do NOT remove file path    Answer:   [email or web address]  . DG Tibia/Fibula Left    Standing Status:   Future    Number of Occurrences:   1    Standing Expiration Date:   07/05/2021    Order Specific Question:   Reason for Exam (SYMPTOM  OR DIAGNOSIS REQUIRED)    Answer:   pain/ fall    Order Specific Question:   Preferred imaging location?    Answer:   [facility]    Order Specific Question:   Radiology Contrast Protocol - do NOT remove file path    Answer:   [email or web address]  . Flu Vaccine QUAD High Dose(Fluad)    Requested Prescriptions    No prescriptions requested or ordered in this encounter

## 2020-07-12 ENCOUNTER — Encounter: Payer: Self-pay | Admitting: Internal Medicine

## 2020-07-12 ENCOUNTER — Telehealth (INDEPENDENT_AMBULATORY_CARE_PROVIDER_SITE_OTHER): Payer: Medicare Other | Admitting: Internal Medicine

## 2020-07-12 DIAGNOSIS — R197 Diarrhea, unspecified: Secondary | ICD-10-CM | POA: Diagnosis not present

## 2020-07-12 MED ORDER — METRONIDAZOLE 500 MG PO TABS: 500.0000 mg | ORAL_TABLET | Freq: Four times a day (QID) | ORAL | 0 refills | Status: DC

## 2020-07-12 MED ORDER — DIPHENOXYLATE-ATROPINE 2.5-0.025 MG PO TABS: 1.0000 | ORAL_TABLET | Freq: Four times a day (QID) | ORAL | 0 refills | Status: DC | PRN

## 2020-07-12 MED ORDER — METRONIDAZOLE 500 MG PO TABS
500.0000 mg | ORAL_TABLET | Freq: Four times a day (QID) | ORAL | 0 refills | Status: DC
Start: 2020-07-12 — End: 2020-09-15

## 2020-07-12 NOTE — Assessment & Plan Note (Addendum)
Flagyl po Lomotil po OV if not better or worse

## 2020-07-12 NOTE — Progress Notes (Signed)
Virtual Visit via Telephone Note  I connected with [name] on 07/12/20 at  1:20 PM EDT by telephone and verified that I am speaking with the correct person using two identifiers.   I discussed the limitations, risks, security and privacy concerns of performing an evaluation and management service by telephone and the availability of in person appointments. I also discussed with the patient that there may be a patient responsible charge related to this service. The patient expressed understanding and agreed to proceed.   History of Present Illness: C/o diarrhea 3/day since Mon last week. C/o gas and bloating. No recent abx   Observations/Objective:  Sounds in NAD  Assessment and Plan:  See plan Follow Up Instructions:    I discussed the assessment and treatment plan with the patient. The patient was provided an opportunity to ask questions and all were answered. The patient agreed with the plan and demonstrated an understanding of the instructions.   The patient was advised to call back or seek an in-person evaluation if the symptoms worsen or if the condition fails to improve as anticipated.  I provided 15 minutes of non-face-to-face time during this encounter.   Walker Kehr, MD

## 2020-07-16 ENCOUNTER — Encounter: Payer: Self-pay | Admitting: Family Medicine

## 2020-07-16 ENCOUNTER — Ambulatory Visit: Payer: Self-pay

## 2020-07-16 ENCOUNTER — Other Ambulatory Visit: Payer: Self-pay

## 2020-07-16 ENCOUNTER — Ambulatory Visit: Payer: Medicare Other | Admitting: Family Medicine

## 2020-07-16 VITALS — BP 110/70 | HR 67 | Ht 66.0 in | Wt 165.0 lb

## 2020-07-16 DIAGNOSIS — M79605 Pain in left leg: Secondary | ICD-10-CM

## 2020-07-16 DIAGNOSIS — T792XXA Traumatic secondary and recurrent hemorrhage and seroma, initial encounter: Secondary | ICD-10-CM | POA: Diagnosis not present

## 2020-07-16 NOTE — Patient Instructions (Signed)
Thank you for coming in today.  Please use voltaren gel up to 4x daily for pain as needed.   I recommend you obtained a compression sleeve to help with your joint problems. There are many options on the market however I recommend obtaining a full calf Body Helix compression sleeve.  You can find information (including how to appropriate measure yourself for sizing) can be found at www.Body http://www.lambert.com/.  Many of these products are health savings account (HSA) eligible.   You can use the compression sleeve at any time throughout the day but is most important to use while being active as well as for 2 hours post-activity.   It is appropriate to ice following activity with the compression sleeve in place.  Recheck in 4 weeks or sooner if needed.    Seroma A seroma is a collection of fluid on the body that looks like swelling or a mass. Seromas form where tissue has been injured or cut. Seromas vary in size. Some are small and painless. Others may become large and cause pain or discomfort. Many seromas go away on their own as the fluid is naturally absorbed by the body, and some seromas need to be drained. What are the causes? Seromas form as the result of damage to tissue or the removal of tissue. This tissue damage may occur during surgery or because of an injury or trauma. When tissue is disrupted or removed, empty space is created. The body's natural defense system (immune system) causes fluid to enter the empty space and form a seroma. What are the signs or symptoms? Symptoms of this condition include:  Swelling at the site of a surgical cut (incision) or an injury.  Drainage of clear fluid at the surgery or injury site.  Discomfort or pain. How is this diagnosed? This condition is diagnosed based on your symptoms, your medical history, and a physical exam. During the exam, your health care provider will press on the seroma. You may also have tests, including:  Blood tests.  Imaging tests,  such as an ultrasound or CT scan. How is this treated? Some seromas go away (resolve) on their own. Your health care provider may monitor you to make sure the seroma does not cause any complications. If your seroma does not resolve on its own, treatment may include:  Using a needle to drain the fluid from the seroma (needle aspiration).  Inserting a flexible tube (catheter) to drain the fluid.  Applying a bandage (dressing), such as an elastic bandage or binder.  Antibiotic medicines, if the seroma becomes infected. In rare cases, surgery may be done to remove the seroma and repair the area. Follow these instructions at home:   If you were prescribed an antibiotic medicine, take it as told by your health care provider. Do not stop taking the antibiotic even if you start to feel better.  Return to your normal activities as told by your health care provider. Ask your health care provider what activities are safe for you.  Take over-the-counter and prescription medicines only as told by your health care provider.  Check your seroma every day for signs of infection. Check for: ? Redness or pain. ? Fluid or pus. ? More swelling. ? Warmth.  Keep all follow-up visits as told by your health care provider. This is important. Contact a health care provider if:  You have a fever.  You have redness or pain at the site of the seroma.  You have fluid or pus coming  from the seroma.  Your seroma is more swollen or is getting bigger.  Your seroma is warm to the touch. This information is not intended to replace advice given to you by your health care provider. Make sure you discuss any questions you have with your health care provider. Document Revised: 09/21/2017 Document Reviewed: 07/21/2016 Elsevier Patient Education  2020 [employer].

## 2020-07-16 NOTE — Progress Notes (Signed)
   Rito Ehrlich, am serving as a Education administrator for Dr. [name].  Jennifer Huang is a 69 y.o. female who presents to [place] [facility] today for L calf pain, swelling and bruising after tripping over a baby gate in early Sept 2021 and landing on her L side.  She was last seen by Dr. Tamala Julian on Sept 7, 2021 for her L knee pain.  She saw primary care on 07/05/20 regarding her L leg after tripping over the baby gate and falling on her L side.  Since then, she reports the metal of the gate hit down on her L calf and is bruised and swollen and painful.   Diagnostic testing: L ankle and L tib/fib XR- 07/05/20   Pertinent review of systems: No fevers or chills  Relevant historical information: Hypertension heart failure frequent falls   Exam:  BP 110/70 (BP Location: Left Arm, Patient Position: Sitting, Cuff Size: Normal)   Pulse 67   Ht 5\' 6"  (1.676 m)   Wt 165 lb (74.8 kg)   SpO2 97%   BMI 26.63 kg/m  General: Well Developed, well nourished, and in no acute distress.   MSK: Left leg medially swollen and somewhat red with some resolving ecchymosis.  Mildly tender palpation not hot to touch. Normal calf motion. Sensation is intact distally.    Lab and Radiology Results  Diagnostic Limited MSK Ultrasound of: Left medial calf Area of hyperechoic change superficial to the soleus muscle and fascia at medial calf distal to the musculotendinous junction of medial gastrocnemius.  Some surrounding isoechoic change present as well.  This is consistent with hematoma draining into a seroma Impression: Seroma and hematoma medial mid calf left     Assessment and Plan: 69 y.o. female with left seroma and hematoma.  Injury occurred about 2 weeks ago.  I am optimistic that this will resolve on its own with some conservative management.  Plan for compression and continue activity as tolerated.  We will also recommend Voltaren gel.  Recheck in about 4 weeks.  At that time if  needed we will proceed to aspiration and injection.  Additionally at this time I do not think that the skin changes are due to infection.  At follow-up we may send to physical therapy for balance training as she does have frequent falls.   PDMP not reviewed this encounter. Orders Placed This Encounter  Procedures  . [place] LIMITED JOINT SPACE STRUCTURES LOW LEFT(NO LINKED CHARGES)    Standing Status:   Future    Number of Occurrences:   1    Standing Expiration Date:   07/16/2021    Order Specific Question:   Reason for Exam (SYMPTOM  OR DIAGNOSIS REQUIRED)    Answer:   Left leg Pain    Order Specific Question:   Preferred imaging location?    Answer:   [place]   No orders of the defined types were placed in this encounter.    Discussed warning signs or symptoms. Please see discharge instructions. Patient expresses understanding.   The above documentation has been reviewed and is accurate and complete [name], M.D.

## 2020-07-19 DIAGNOSIS — R946 Abnormal results of thyroid function studies: Secondary | ICD-10-CM | POA: Diagnosis not present

## 2020-07-19 DIAGNOSIS — R609 Edema, unspecified: Secondary | ICD-10-CM | POA: Diagnosis not present

## 2020-07-19 DIAGNOSIS — Z8639 Personal history of other endocrine, nutritional and metabolic disease: Secondary | ICD-10-CM | POA: Diagnosis not present

## 2020-07-19 DIAGNOSIS — I10 Essential (primary) hypertension: Secondary | ICD-10-CM | POA: Diagnosis not present

## 2020-07-20 ENCOUNTER — Other Ambulatory Visit: Payer: Self-pay | Admitting: Internal Medicine

## 2020-07-20 DIAGNOSIS — E890 Postprocedural hypothyroidism: Secondary | ICD-10-CM

## 2020-07-20 DIAGNOSIS — M5412 Radiculopathy, cervical region: Secondary | ICD-10-CM | POA: Diagnosis not present

## 2020-07-20 DIAGNOSIS — Z8639 Personal history of other endocrine, nutritional and metabolic disease: Secondary | ICD-10-CM

## 2020-07-20 DIAGNOSIS — M545 Low back pain: Secondary | ICD-10-CM | POA: Diagnosis not present

## 2020-07-20 DIAGNOSIS — M47816 Spondylosis without myelopathy or radiculopathy, lumbar region: Secondary | ICD-10-CM | POA: Diagnosis not present

## 2020-07-20 DIAGNOSIS — M5136 Other intervertebral disc degeneration, lumbar region: Secondary | ICD-10-CM | POA: Diagnosis not present

## 2020-07-20 DIAGNOSIS — M47812 Spondylosis without myelopathy or radiculopathy, cervical region: Secondary | ICD-10-CM | POA: Diagnosis not present

## 2020-07-21 ENCOUNTER — Ambulatory Visit: Payer: Medicare Other | Admitting: Pharmacist

## 2020-07-21 ENCOUNTER — Other Ambulatory Visit: Payer: Self-pay

## 2020-07-21 DIAGNOSIS — E7800 Pure hypercholesterolemia, unspecified: Secondary | ICD-10-CM

## 2020-07-21 DIAGNOSIS — I5032 Chronic diastolic (congestive) heart failure: Secondary | ICD-10-CM

## 2020-07-21 DIAGNOSIS — I10 Essential (primary) hypertension: Secondary | ICD-10-CM

## 2020-07-21 NOTE — Patient Instructions (Addendum)
Visit Information  Phone number for Pharmacist: [phone]  Thank you for meeting with me to discuss your medications! I look forward to working with you to achieve your health care goals. Below is a summary of what we talked about during the visit:  Goals Addressed            This Visit's Progress   . Keep Pain Under Control          - plan exercise or activity when pain is best controlled - stay active - track times pain is worst and when it is best - use ice or heat for pain relief    Why is this important?   Day-to-day life can be hard when you have back pain.  Pain medicine is just one piece of the treatment puzzle. There are many things you can do to manage pain and keep your back strong.   Lifestyle changes, like stopping smoking and eating foods with Vitamin D and calcium, keep your bones and muscles healthy. Your back is better when it is supported by strong muscles.  You can try these action steps to help you manage your pain.     Notes:     Marland Kitchen Manage My Medicine          - keep a list of all the medicines I take; vitamins and herbals too - use a pillbox to sort medicine    Why is this important?   These steps will help you keep on track with your medicines.    Notes:     . Pharmacy Care Plan       CARE PLAN ENTRY (see longitudinal plan of care for additional care plan information)  Current Barriers:  . Chronic Disease Management support, education, and care coordination needs related to Hypertension, Hyperlipidemia, Heart Failure, and Osteopenia   Hypertension / Heart Failure BP Readings from Last 3 Encounters:  03/09/20 112/68  02/02/20 124/70  09/09/19 122/64 .  Pharmacist Clinical Goal(s): o Over the next 180 days, patient will work with PharmD and providers to maintain BP goal <130/80 . Current regimen:  o Amodipine 10 mg daily o Furosemide 20 mg daily PRN . Interventions: o Discussed BP goals and benefits of medications for prevention of heart  attack / stroke . Patient self care activities - Over the next 180 days, patient will: o Check BP as needed, document, and provide at future appointments o Ensure daily salt intake < 2300 mg/day  Hyperlipidemia Lab Results  Component Value Date/Time   LDLCALC 139 (H) 07/25/2018 10:39 AM   LDLDIRECT 129.6 10/17/2012 03:05 PM .  Pharmacist Clinical Goal(s): o Over the next 180 days, patient will work with PharmD and providers to achieve LDL goal < 100 . Current regimen:  o No medications . Interventions: o Discussed cholesterol goals and benefits of healthy lifestyle for prevention of heart attack / stroke . Patient self care activities - Over the next 180 days, patient will: o Continue low cholesterol diet o Try yoga a few times a week  Osteopenia . Pharmacist Clinical Goal(s) o Over the next 180 days, patient will work with PharmD and providers to optimize therapy . Current regimen:  o Calcium o Vitamin D . Interventions: o Advised to move calcium to bedtime to avoid interaction with levothyroxine . Patient self care activities - Over the next 180 days, patient will: o Continue medications as directed  Medication management . Pharmacist Clinical Goal(s): o Over the next 180 days, patient will  work with PharmD and providers to maintain optimal medication adherence . Current pharmacy: [facility] mail order . Interventions o Comprehensive medication review performed. o Continue current medication management strategy o Consult insurance formulary for Epi-Pen . Patient self care activities - Over the next 180 days, patient will: o Focus on medication adherence by fill date o Take medications as prescribed o Report any questions or concerns to PharmD and/or provider(s)  Initial goal documentation    . Track and Manage Fluids and Swelling          - do ankle pumps when sitting - keep legs up while sitting - use salt in moderation - watch for swelling in feet, ankles and  legs every day - weigh myself daily    Why is this important?   It is important to check your weight daily and watch how much salt and liquids you have.  It will help you to manage your heart failure.    Notes:       Jennifer Huang was given information about Chronic Care Management services today including:  1. CCM service includes personalized support from designated clinical staff supervised by her physician, including individualized plan of care and coordination with other care providers 2. 24/7 contact phone numbers for assistance for urgent and routine care needs. 3. Standard insurance, coinsurance, copays and deductibles apply for chronic care management only during months in which we provide at least 20 minutes of these services. Most insurances cover these services at 100%, however patients may be responsible for any copay, coinsurance and/or deductible if applicable. This service may help you avoid the need for more expensive face-to-face services. 4. Only one practitioner may furnish and bill the service in a calendar month. 5. The patient may stop CCM services at any time (effective at the end of the month) by phone call to the office staff.  Patient agreed to services and verbal consent obtained.   Patient verbalizes understanding of instructions provided today.  Telephone follow up appointment with pharmacy team member scheduled for: 6 months  Charlene Brooke, PharmD, BCACP Clinical Pharmacist [facility] [place] Maintenance for Postmenopausal Women Menopause is a normal process in which your ability to get pregnant comes to an end. This process happens slowly over many months or years, usually between the ages of 69 and 27. Menopause is complete when you have missed your menstrual periods for 12 months. It is important to talk with your health care provider about some of the most common conditions that affect women after menopause  (postmenopausal women). These include heart disease, cancer, and bone loss (osteoporosis). Adopting a healthy lifestyle and getting preventive care can help to promote your health and wellness. The actions you take can also lower your chances of developing some of these common conditions. What should I know about menopause? During menopause, you may get a number of symptoms, such as:  Hot flashes. These can be moderate or severe.  Night sweats.  Decrease in sex drive.  Mood swings.  Headaches.  Tiredness.  Irritability.  Memory problems.  Insomnia. Choosing to treat or not to treat these symptoms is a decision that you make with your health care provider. Do I need hormone replacement therapy?  Hormone replacement therapy is effective in treating symptoms that are caused by menopause, such as hot flashes and night sweats.  Hormone replacement carries certain risks, especially as you become older. If you are thinking about using estrogen or estrogen with  progestin, discuss the benefits and risks with your health care provider. What is my risk for heart disease and stroke? The risk of heart disease, heart attack, and stroke increases as you age. One of the causes may be a change in the body's hormones during menopause. This can affect how your body uses dietary fats, triglycerides, and cholesterol. Heart attack and stroke are medical emergencies. There are many things that you can do to help prevent heart disease and stroke. Watch your blood pressure  High blood pressure causes heart disease and increases the risk of stroke. This is more likely to develop in people who have high blood pressure readings, are of African descent, or are overweight.  Have your blood pressure checked: ? Every 3-5 years if you are 39-63 years of age. ? Every year if you are 65 years old or older. Eat a healthy diet   Eat a diet that includes plenty of vegetables, fruits, low-fat dairy products, and  lean protein.  Do not eat a lot of foods that are high in solid fats, added sugars, or sodium. Get regular exercise Get regular exercise. This is one of the most important things you can do for your health. Most adults should:  Try to exercise for at least 150 minutes each week. The exercise should increase your heart rate and make you sweat (moderate-intensity exercise).  Try to do strengthening exercises at least twice each week. Do these in addition to the moderate-intensity exercise.  Spend less time sitting. Even light physical activity can be beneficial. Other tips  Work with your health care provider to achieve or maintain a healthy weight.  Do not use any products that contain nicotine or tobacco, such as cigarettes, e-cigarettes, and chewing tobacco. If you need help quitting, ask your health care provider.  Know your numbers. Ask your health care provider to check your cholesterol and your blood sugar (glucose). Continue to have your blood tested as directed by your health care provider. Do I need screening for cancer? Depending on your health history and family history, you may need to have cancer screening at different stages of your life. This may include screening for:  Breast cancer.  Cervical cancer.  Lung cancer.  Colorectal cancer. What is my risk for osteoporosis? After menopause, you may be at increased risk for osteoporosis. Osteoporosis is a condition in which bone destruction happens more quickly than new bone creation. To help prevent osteoporosis or the bone fractures that can happen because of osteoporosis, you may take the following actions:  If you are 61-32 years old, get at least 1,000 mg of calcium and at least 600 mg of vitamin D per day.  If you are older than age 65 but younger than age 44, get at least 1,200 mg of calcium and at least 600 mg of vitamin D per day.  If you are older than age 1, get at least 1,200 mg of calcium and at least 800 mg  of vitamin D per day. Smoking and drinking excessive alcohol increase the risk of osteoporosis. Eat foods that are rich in calcium and vitamin D, and do weight-bearing exercises several times each week as directed by your health care provider. How does menopause affect my mental health? Depression may occur at any age, but it is more common as you become older. Common symptoms of depression include:  Low or sad mood.  Changes in sleep patterns.  Changes in appetite or eating patterns.  Feeling an overall lack of  motivation or enjoyment of activities that you previously enjoyed.  Frequent crying spells. Talk with your health care provider if you think that you are experiencing depression. General instructions See your health care provider for regular wellness exams and vaccines. This may include:  Scheduling regular health, dental, and eye exams.  Getting and maintaining your vaccines. These include: ? Influenza vaccine. Get this vaccine each year before the flu season begins. ? Pneumonia vaccine. ? Shingles vaccine. ? Tetanus, diphtheria, and pertussis (Tdap) booster vaccine. Your health care provider may also recommend other immunizations. Tell your health care provider if you have ever been abused or do not feel safe at home. Summary  Menopause is a normal process in which your ability to get pregnant comes to an end.  This condition causes hot flashes, night sweats, decreased interest in sex, mood swings, headaches, or lack of sleep.  Treatment for this condition may include hormone replacement therapy.  Take actions to keep yourself healthy, including exercising regularly, eating a healthy diet, watching your weight, and checking your blood pressure and blood sugar levels.  Get screened for cancer and depression. Make sure that you are up to date with all your vaccines. This information is not intended to replace advice given to you by your health care provider. Make sure  you discuss any questions you have with your health care provider. Document Revised: 10/02/2018 Document Reviewed: 10/02/2018 Elsevier Patient Education  2020 [employer].

## 2020-07-21 NOTE — Chronic Care Management (AMB) (Signed)
Chronic Care Management Pharmacy  Name: KHYRA VISCUSO  MRN: [id number] DOB: 1951-07-12   Chief Complaint/ HPI  [name],  69 y.o. , female presents for their Initial CCM visit with the clinical pharmacist via telephone due to COVID-19 Pandemic.  PCP : Hoyt Koch, MD Patient Care Team: Hoyt Koch, MD as PCP - General (Internal Medicine) Ubaldo Glassing, Marny Lowenstein, MD (Inactive) (Obstetrics and Gynecology) Marily Memos, MD (Orthopedic Surgery) Marylynn Pearson, MD (Ophthalmology) Mosetta Anis, MD (Allergy) Rulon Sera, MD (Rehabilitation) Golden Circle, MD as Referring Physician (Ophthalmology) Leta Baptist, MD as Consulting Physician (Otolaryngology) Delsa Sale Sudie Grumbling, MD as Referring Physician (Ophthalmology) Charlton Haws, [facility] as Pharmacist (Pharmacist)  Their chronic conditions include: Hypertension, Hyperlipidemia, Heart Failure, Osteoarthritis, Gout and Glaucoma   Pt is a widow, she lives alone, she has a cousin close by but many other close relatives have passed as well.   Office Visits: 07/12/20 Dr Alain Marion VV: acute diarrhea, rx'd Flagyl and Lomotil.   07/05/20 NP Jodi Mourning OV: f/u for mechanical fall, Xray negative for fracture.  05/31/20 Dr Sharlet Salina OV: acute visit for R leg pain s/p fall. Rx'd Flexeril.  Consult Visit: 07/20/20 PA Elonda Husky ([facility]): f/u for low back pain. Referred to physiatrist and PT  07/16/20 Dr Georgina Snell ([facility]): rec'd voltaren gel and compression sleeve for joint pain (fall on arm)  06/29/20 Dr Tamala Julian ([facility] med): knee pain f/u, Pt also had gout flare. Improvement in knee pain after aspiration.  06/15/20 Dr Tamala Julian (sports med): f/u for knee pain, c/w gout flare, rx'd colchicine for 5 days.  Allergies  Allergen Reactions  . Penicillins     REACTION: rash/hives  . Sulfonamide Derivatives     REACTION: rash/hives    Medications: Outpatient Encounter Medications as of 07/21/2020    Medication Sig  . amLODipine (NORVASC) 10 MG tablet TAKE 1 TABLET BY MOUTH  DAILY  . Ascorbic Acid (VITAMIN C PO) Take by mouth.  Marland Kitchen aspirin EC 81 MG tablet Take 81 mg by mouth daily. Swallow whole.  . Azelastine HCl 0.15 % SOLN Place 2 sprays into the nose every 12 (twelve) hours as needed.  . brimonidine (ALPHAGAN P) 0.1 % SOLN 3 (three) times daily.    Marland Kitchen CALCIUM PO Take by mouth.  . diclofenac Sodium (VOLTAREN) 1 % GEL Apply 2 g topically 4 (four) times daily.  . dorzolamide-timolol (COSOPT) 22.3-6.8 MG/ML ophthalmic solution 1 drop 2 (two) times daily.    . fexofenadine (ALLEGRA) 180 MG tablet Take 180 mg by mouth daily.  . fluticasone (FLONASE) 50 MCG/ACT nasal spray Place 2 sprays into both nostrils daily.  . furosemide (LASIX) 20 MG tablet TAKE 1 TABLET BY MOUTH  DAILY IF NEEDED  . levothyroxine (SYNTHROID) 50 MCG tablet Take 50 mcg by mouth daily.  Marland Kitchen lidocaine (LIDODERM) 5 % Place 1 patch onto the skin daily. Remove & Discard patch within 12 hours or as directed by MD  . meloxicam (MOBIC) 15 MG tablet Take 15 mg by mouth daily.  . montelukast (SINGULAIR) 10 MG tablet Take 10 mg by mouth every evening.  . Multiple Vitamin (MULTIVITAMIN) tablet Take 1 tablet by mouth daily.  . Olopatadine HCl 0.2 % SOLN Apply 1 drop to eye daily.  Marland Kitchen ROCKLATAN 0.02-0.005 % SOLN   . TART CHERRY PO Take 1 tablet by mouth daily.  Marland Kitchen VITAMIN D PO Take by mouth.  . colchicine 0.6 MG tablet Take 1 tablet (0.6 mg total) by  mouth daily. (Patient not taking: Reported on 07/05/2020)  . cyclobenzaprine (FLEXERIL) 5 MG tablet Take 1 tablet (5 mg total) by mouth 3 (three) times daily as needed for muscle spasms. (Patient not taking: Reported on 07/21/2020)  . diphenoxylate-atropine (LOMOTIL) 2.5-0.025 MG tablet Take 1 tablet by mouth 4 (four) times daily as needed for diarrhea or loose stools. (Patient not taking: Reported on 07/21/2020)  . metroNIDAZOLE (FLAGYL) 500 MG tablet Take 1 tablet (500 mg total) by mouth 4  (four) times daily. (Patient not taking: Reported on 07/21/2020)   No facility-administered encounter medications on file as of 07/21/2020.    Wt Readings from Last 3 Encounters:  07/16/20 165 lb (74.8 kg)  07/05/20 164 lb (74.4 kg)  06/29/20 165 lb (74.8 kg)    Current Diagnosis/Assessment:  SDOH Interventions     Most Recent Value  SDOH Interventions  Financial Strain Interventions Other (Comment)  [consult formulary for cheaper options]      Goals Addressed            This Visit's Progress   . Pharmacy Care Plan       CARE PLAN ENTRY (see longitudinal plan of care for additional care plan information)  Current Barriers:  . Chronic Disease Management support, education, and care coordination needs related to Hypertension, Hyperlipidemia, Heart Failure, and Osteopenia   Hypertension / Heart Failure BP Readings from Last 3 Encounters:  03/09/20 112/68  02/02/20 124/70  09/09/19 122/64 .  Pharmacist Clinical Goal(s): o Over the next 180 days, patient will work with PharmD and providers to maintain BP goal <130/80 . Current regimen:  o Amodipine 10 mg daily o Furosemide 20 mg daily PRN . Interventions: o Discussed BP goals and benefits of medications for prevention of heart attack / stroke . Patient self care activities - Over the next 180 days, patient will: o Check BP as needed, document, and provide at future appointments o Ensure daily salt intake < 2300 mg/day  Hyperlipidemia Lab Results  Component Value Date/Time   LDLCALC 139 (H) 07/25/2018 10:39 AM   LDLDIRECT 129.6 10/17/2012 03:05 PM .  Pharmacist Clinical Goal(s): o Over the next 180 days, patient will work with PharmD and providers to achieve LDL goal < 100 . Current regimen:  o No medications . Interventions: o Discussed cholesterol goals and benefits of healthy lifestyle for prevention of heart attack / stroke . Patient self care activities - Over the next 180 days, patient will: o Continue low  cholesterol diet o Try yoga a few times a week  Osteopenia . Pharmacist Clinical Goal(s) o Over the next 180 days, patient will work with PharmD and providers to optimize therapy . Current regimen:  o Calcium o Vitamin D . Interventions: o Advised to move calcium to bedtime to avoid interaction with levothyroxine . Patient self care activities - Over the next 180 days, patient will: o Continue medications as directed  Medication management . Pharmacist Clinical Goal(s): o Over the next 180 days, patient will work with PharmD and providers to maintain optimal medication adherence . Current pharmacy: [facility] mail order . Interventions o Comprehensive medication review performed. o Continue current medication management strategy o Consult insurance formulary for Epi-Pen . Patient self care activities - Over the next 180 days, patient will: o Focus on medication adherence by fill date o Take medications as prescribed o Report any questions or concerns to PharmD and/or provider(s)  Initial goal documentation       Heart Failure / HTN  Type: Diastolic (Grade 1)  Last ejection fraction: 60-65% (05/01/2017)  BP goal is:  <130/80 BP Readings from Last 3 Encounters:  07/16/20 110/70  07/05/20 124/68  06/29/20 120/78   Kidney Function Lab Results  Component Value Date/Time   CREATININE 0.87 05/31/2020 02:40 PM   CREATININE 1.11 (H) 09/04/2019 03:00 PM   CREATININE 0.77 07/25/2018 10:39 AM   GFR 95.99 07/25/2018 10:39 AM   GFRNONAA 51 (L) 09/04/2019 03:00 PM   GFRAA 59 (L) 09/04/2019 03:00 PM   K 4.0 05/31/2020 02:40 PM   K 4.5 09/04/2019 03:00 PM   Patient checks BP at home infrequently Patient home BP readings are ranging: n/a  Patient has failed these meds in past: n/a Patient is currently controlled on the following medications:  . Amlodipine 10 mg daily . Furosemide 20 mg daily PRN  We discussed: BP goals; benefits of medications; pt takes furosemide  typically 1-2 times per week as needed for LE swelling  Plan  Continue current medications   Hyperlipidemia   LDL goal < 130  Lipid Panel     Component Value Date/Time   CHOL 245 (H) 05/31/2020 1440   TRIG 152 (H) 05/31/2020 1440   TRIG 68 10/03/2006 1223   HDL 74 05/31/2020 1440   LDLCALC 143 (H) 05/31/2020 1440   LDLDIRECT 129.6 10/17/2012 1505    Hepatic Function Latest Ref Rng & Units 05/31/2020 07/25/2018 04/19/2017  Total Protein 6.1 - 8.1 g/dL 6.8 7.4 7.1  Albumin 3.5 - 5.2 g/dL - 4.5 4.7  AST 10 - 35 U/L '17 16 16  ' ALT 6 - 29 U/L '13 13 12  ' Alk Phosphatase 39 - 117 U/L - 60 61  Total Bilirubin 0.2 - 1.2 mg/dL 0.6 0.8 0.7  Bilirubin, Direct 0.0 - 0.3 mg/dL - - 0.1     The 10-year ASCVD risk score Mikey Bussing DC Jr., et al., 2013) is: 14.5%   Values used to calculate the score:     Age: 29 years     Sex: Female     Is Non-Hispanic African American: Yes     Diabetic: No     Tobacco smoker: No     Systolic Blood Pressure: 433 mmHg     Is BP treated: Yes     HDL Cholesterol: 74 mg/dL     Total Cholesterol: 245 mg/dL   Patient has failed these meds in past: n/a Patient is currently uncontrolled on the following medications:  . No medications  We discussed:  diet and exercise extensively; Cholesterol goals; importance of diet and exercise   Plan  Continue current medications  Hypothyroidism   Follows with endocrine  Lab Results  Component Value Date/Time   TSH 8.44 (H) 05/31/2020 02:40 PM   TSH 1.31 01/22/2018 01:54 PM   FREET4 0.74 03/17/2014 12:14 PM   Patient has failed these meds in past: n/a Patient is currently controlled on the following medications:  . Levothyroxine 50 mcg daily  We discussed:  Pt has not picked up new rx yet; counseled to take levothyroxine first thing in AM, on empty stomach, at least 30 min before eating or other medications  Plan  Continue current medications  Gout   Patient has failed these meds in past: n/a Patient is  currently controlled on the following medications:  . Colchicine 0.6 mg PRN (not taking)  We discussed:  Pt had first gout flare recently, improved with colchicine; discussed low purine diet  Plan  Continue current medications  Chronic pain  Lumbar, cervical spondylosis Radiculopathy of cervical region Osteoarthritis  Follows with [facility]  Patient has failed these meds in past: cyclobenzaprine Patient is currently controlled on the following medications:  . Lidocaine patch q12h PRN . Meloxicam 15 mg daily . Pennsaid . Voltaren gel  We discussed:  Patient is satisfied with current regimen and is awaiting results of scans from [facility] for further treatment strategy  Plan  Continue current medications  Allergic rhinitis   Follows with Dr Donneta Romberg (asthma/allergy)  Patient has failed these meds in past: n/a Patient is currently controlled on the following medications:  . Azelastine 0.15% nasal spray PRN . Fluticasone nasal spray . Fexofenadine 180 mg daily AM . Montelukast 10 mg HS . Olopatadine 0.2% eye drops  We discussed:  Patient is satisfied with current regimen and denies issues. 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 Per insurance formulary the generic Epi-Pen is Tier 3 $47 (or ~$100 in [place]ut hole).   Plan  Continue current medications  Glaucoma   Per Dr Jimmye Norman  Patient has failed these meds in past: n/a Patient is currently controlled on the following medications:  . Brimonidine (Alphagan) 0.1% eye drops TID . Dorzolamide-timolol (Cosopt) eye drops BID . Rocklatan (netarsudil-latanoprost) eye drops  We discussed:  Pt reports Rocklatan is very expensive; per formulary latanoprost is the preferred product however patient reports she was switched to Rocklatan due to inability to control IOP with previous meds including latanoprost. Rocklatan does not have PAP  available.  Plan  Continue current medications  Health Maintenance   Patient is currently controlled on the following medications:  Marland Kitchen Vitamin C . Calcium . Vitamin D . [place]nell . Multivitamin  We discussed:  Patient is satisfied with current OTC regimen and denies issues   Plan  Continue current medications   Medication Management   Pt uses [facility] mail pharmacy for all medications Uses pill box? Yes Pt endorses 100% compliance  We discussed: Current pharmacy is preferred with insurance plan and patient is satisfied with pharmacy services  Plan  Continue current medication management strategy    Follow up: 6 month phone visit  Charlene Brooke, PharmD, BCACP Clinical Pharmacist [facility] [phone]

## 2020-07-23 ENCOUNTER — Encounter: Payer: Self-pay | Admitting: Gastroenterology

## 2020-07-26 DIAGNOSIS — Z91030 Bee allergy status: Secondary | ICD-10-CM | POA: Diagnosis not present

## 2020-07-26 DIAGNOSIS — J300 Vasomotor rhinitis: Secondary | ICD-10-CM | POA: Diagnosis not present

## 2020-07-28 ENCOUNTER — Ambulatory Visit
Admission: RE | Admit: 2020-07-28 | Discharge: 2020-07-28 | Disposition: A | Discharge status: Home / Self Care | Payer: Medicare Other | Source: Ambulatory Visit | Attending: Internal Medicine | Admitting: Internal Medicine

## 2020-07-28 DIAGNOSIS — Z8639 Personal history of other endocrine, nutritional and metabolic disease: Secondary | ICD-10-CM

## 2020-07-28 DIAGNOSIS — E890 Postprocedural hypothyroidism: Secondary | ICD-10-CM

## 2020-07-28 DIAGNOSIS — E041 Nontoxic single thyroid nodule: Secondary | ICD-10-CM | POA: Diagnosis not present

## 2020-08-02 ENCOUNTER — Other Ambulatory Visit: Payer: Self-pay | Admitting: Internal Medicine

## 2020-08-02 DIAGNOSIS — I10 Essential (primary) hypertension: Secondary | ICD-10-CM

## 2020-08-03 ENCOUNTER — Ambulatory Visit: Payer: Self-pay

## 2020-08-03 ENCOUNTER — Encounter: Payer: Self-pay | Admitting: Internal Medicine

## 2020-08-09 DIAGNOSIS — M5412 Radiculopathy, cervical region: Secondary | ICD-10-CM | POA: Diagnosis not present

## 2020-08-09 DIAGNOSIS — M5116 Intervertebral disc disorders with radiculopathy, lumbar region: Secondary | ICD-10-CM | POA: Diagnosis not present

## 2020-08-17 ENCOUNTER — Ambulatory Visit: Payer: Medicare Other | Admitting: Family Medicine

## 2020-08-17 NOTE — Progress Notes (Deleted)
   I, Jennifer Huang, LAT, ATC, am serving as scribe for Dr. [name].  Jennifer Huang is a 69 y.o. female who presents to [place] at [facility] today for f/u of L calf pain after tripping over a baby gate and falling.  She was last seen by Dr. Georgina Snell on 07/16/20 and was advised to use Voltaren gel and a calf compression sleeve.  Since her last visit, pt reports  Diagnostic testing: L ankle and L tib/fib XR- 07/05/20   Pertinent review of systems: ***  Relevant historical information: ***   Exam:  There were no vitals taken for this visit. General: Well Developed, well nourished, and in no acute distress.   MSK: ***    Lab and Radiology Results No results found for this or any previous visit (from the past 72 hour(s)). No results found.     Assessment and Plan: 69 y.o. female with ***   PDMP not reviewed this encounter. No orders of the defined types were placed in this encounter.  No orders of the defined types were placed in this encounter.    Discussed warning signs or symptoms. Please see discharge instructions. Patient expresses understanding.   ***

## 2020-08-18 ENCOUNTER — Ambulatory Visit: Payer: Medicare Other | Admitting: Family Medicine

## 2020-08-18 ENCOUNTER — Ambulatory Visit: Payer: Self-pay

## 2020-08-18 ENCOUNTER — Other Ambulatory Visit: Payer: Self-pay

## 2020-08-18 ENCOUNTER — Encounter: Payer: Self-pay | Admitting: Family Medicine

## 2020-08-18 VITALS — BP 128/76 | HR 74 | Ht 66.0 in | Wt 165.0 lb

## 2020-08-18 DIAGNOSIS — M1A0620 Idiopathic chronic gout, left knee, without tophus (tophi): Secondary | ICD-10-CM

## 2020-08-18 DIAGNOSIS — M25562 Pain in left knee: Secondary | ICD-10-CM

## 2020-08-18 DIAGNOSIS — G8929 Other chronic pain: Secondary | ICD-10-CM | POA: Diagnosis not present

## 2020-08-18 DIAGNOSIS — T792XXD Traumatic secondary and recurrent hemorrhage and seroma, subsequent encounter: Secondary | ICD-10-CM | POA: Diagnosis not present

## 2020-08-18 LAB — URIC ACID: Uric Acid, Serum: 3.7 mg/dL (ref 2.4–7.0)

## 2020-08-18 MED ORDER — COLCHICINE 0.6 MG PO TABS
0.6000 mg | ORAL_TABLET | Freq: Every day | ORAL | 1 refills | Status: DC | PRN
Start: 2020-08-18 — End: 2020-09-27

## 2020-08-18 NOTE — Patient Instructions (Signed)
Thank you for coming in today.  Plan for injection today for gout.  Restart colchicine as needed.   Continue the compression on your calf.   Get labs today.    Recheck as needed.

## 2020-08-18 NOTE — Progress Notes (Signed)
I, [name], LAT, ATC, am serving as scribe for Dr. [name].  Jennifer Huang is a 69 y.o. female who presents to [place] at [facility] today for f/u of L leg / calf pain that began after tripping over a baby gate.  She was last seen by Dr. Georgina Snell on 07/16/20 and was advised to use Voltaren gel and a calf sleeve.  Since her last visit, pt reports that she has had some swelling in her L distal, medial lower leg.  She has some bruising in this area and is noticing some new sensations in this area.  She is also c/o L knee pain and notes that she has a hx of gout.  She does have some mild swelling in her L knee.  Diagnostic imaging: L ankle and L tib/fib XR- 07/05/20   Pertinent review of systems: No fevers or chills  Relevant historical information: History of gout well managed with colchicine previously   Exam:  BP 128/76 (BP Location: Left Arm, Patient Position: Sitting, Cuff Size: Normal)   Pulse 74   Ht 5\' 6"  (1.676 m)   Wt 165 lb (74.8 kg)   SpO2 98%   BMI 26.63 kg/m  General: Well Developed, well nourished, and in no acute distress.   MSK: Left knee moderate effusion otherwise normal-appearing normal motion with crepitation.  Stable ligamentous exam.  Left medial calf slightly hyperpigmented skin with some firmness.  Nontender to touch.  No fluctuance.  Left great toe swelling and erythema at IP joint nontender MTP.    Lab and Radiology Results  Procedure: Real-time Ultrasound Guided Injection of left knee superior patellar space laterally Device: Philips Affiniti 50G Images permanently stored and available for review in PACS Verbal informed consent obtained.  Discussed risks and benefits of procedure. Warned about infection bleeding damage to structures skin hypopigmentation and fat atrophy among others. Patient expresses understanding and agreement Time-out conducted.   Noted no overlying erythema, induration, or other signs of local  infection.   Skin prepped in a sterile fashion.   Local anesthesia: Topical Ethyl chloride.   With sterile technique and under real time ultrasound guidance:  40 mg of Kenalog and 2 mL of Marcaine injected into joint. Fluid seen entering the joint capsule.   Completed without difficulty   Pain immediately resolved suggesting accurate placement of the medication.   Advised to call if fevers/chills, erythema, induration, drainage, or persistent bleeding.   Images permanently stored and available for review in the ultrasound unit.  Impression: Technically successful ultrasound guided injection.   Diagnostic Limited MSK Ultrasound of: Left medial calf seroma Small area of hyperechoic change middle of hyperpigmented skin medial calf consistent with small seroma measuring 0.5 x 1 cm.  This is considerably smaller in size than previously identified last month. Impression: Small seroma     Chemistry      Component Value Date/Time   NA 140 05/31/2020 1440   K 4.0 05/31/2020 1440   CL 103 05/31/2020 1440   CO2 28 05/31/2020 1440   BUN 20 05/31/2020 1440   CREATININE 0.87 05/31/2020 1440      Component Value Date/Time   CALCIUM 10.2 05/31/2020 1440   ALKPHOS 60 07/25/2018 1039   AST 17 05/31/2020 1440   ALT 13 05/31/2020 1440   BILITOT 0.6 05/31/2020 1440     Lab Results  Component Value Date   LABURIC 4.2 10/17/2012       Assessment and Plan: 69 y.o. female  with left knee pain thought to be due to gout exacerbation.  Plan for steroid injection and restart colchicine which is tolerated previously. I reviewed her creatinine obtained in August of this year which was normal.  Uric acid has not been checked since 2013 we will update uric acid lab today.  Prescribed colchicine recheck back as needed.  Left leg seroma improving both symptomatically and in size appearance on ultrasound.  Plan to continue compression and recheck as needed.  If does not resolve would consider aspiration and  injection but since it is improving will hopefully be able to avoid interventions on the seroma.  PDMP not reviewed this encounter. Orders Placed This Encounter  Procedures  . [place] LIMITED JOINT SPACE STRUCTURES LOW LEFT(NO LINKED CHARGES)    Order Specific Question:   Reason for Exam (SYMPTOM  OR DIAGNOSIS REQUIRED)    Answer:   L knee pain    Order Specific Question:   Preferred imaging location?    Answer:   [place]  . Uric acid    Standing Status:   Future    Standing Expiration Date:   08/18/2021   Meds ordered this encounter  Medications  . colchicine 0.6 MG tablet    Sig: Take 1 tablet (0.6 mg total) by mouth daily as needed (gout pain).    Dispense:  30 tablet    Refill:  1     Discussed warning signs or symptoms. Please see discharge instructions. Patient expresses understanding.   The above documentation has been reviewed and is accurate and complete [name], M.D.

## 2020-08-19 NOTE — Progress Notes (Signed)
Uric acid level is normal. It is not clear how much of your pain is due to gout. The cortisone shot should help with the pain and swelling. Use the colchicine as needed.

## 2020-08-20 ENCOUNTER — Other Ambulatory Visit: Payer: Self-pay | Admitting: Family Medicine

## 2020-08-30 ENCOUNTER — Telehealth: Payer: Self-pay | Admitting: Pharmacist

## 2020-08-30 NOTE — Progress Notes (Signed)
Chronic Care Management Pharmacy Assistant   Name: GENIECE AKERS  MRN: [id number] DOB: 1950-11-28  Reason for Encounter: General Adherence Call  Patient Questions:  1.  Have you seen any other providers since your last visit? Yes, Patient was seen by Dr. [name] on 08/18/20  2.  Any changes in your medicines or health? No    PCP : Hoyt Koch, MD  Allergies:   Allergies  Allergen Reactions  . Penicillins     REACTION: rash/hives  . Sulfonamide Derivatives     REACTION: rash/hives    Medications: Outpatient Encounter Medications as of 08/30/2020  Medication Sig  . amLODipine (NORVASC) 10 MG tablet TAKE 1 TABLET BY MOUTH  DAILY  . Ascorbic Acid (VITAMIN C PO) Take by mouth.  Marland Kitchen aspirin EC 81 MG tablet Take 81 mg by mouth daily. Swallow whole.  . Azelastine HCl 0.15 % SOLN Place 2 sprays into the nose every 12 (twelve) hours as needed.  . brimonidine (ALPHAGAN P) 0.1 % SOLN 3 (three) times daily.    Marland Kitchen CALCIUM PO Take by mouth.  . colchicine 0.6 MG tablet Take 1 tablet (0.6 mg total) by mouth daily as needed (gout pain).  . cyclobenzaprine (FLEXERIL) 5 MG tablet Take 1 tablet (5 mg total) by mouth 3 (three) times daily as needed for muscle spasms. (Patient not taking: Reported on 07/21/2020)  . diclofenac Sodium (VOLTAREN) 1 % GEL Apply 2 g topically 4 (four) times daily.  . diphenoxylate-atropine (LOMOTIL) 2.5-0.025 MG tablet Take 1 tablet by mouth 4 (four) times daily as needed for diarrhea or loose stools. (Patient not taking: Reported on 07/21/2020)  . dorzolamide-timolol (COSOPT) 22.3-6.8 MG/ML ophthalmic solution 1 drop 2 (two) times daily.    Marland Kitchen EPINEPHrine 0.3 mg/0.3 mL IJ SOAJ injection Inject 0.3 mg into the muscle as needed for anaphylaxis. GENERIC TWINJECT PREFERRED  . fexofenadine (ALLEGRA) 180 MG tablet Take 180 mg by mouth daily.  . fluticasone (FLONASE) 50 MCG/ACT nasal spray Place 2 sprays into both nostrils daily.  . furosemide (LASIX) 20 MG tablet  TAKE 1 TABLET BY MOUTH  DAILY IF NEEDED  . levothyroxine (SYNTHROID) 50 MCG tablet Take 50 mcg by mouth daily.  Marland Kitchen lidocaine (LIDODERM) 5 % Place 1 patch onto the skin daily. Remove & Discard patch within 12 hours or as directed by MD  . meloxicam (MOBIC) 15 MG tablet Take 15 mg by mouth daily.  . metroNIDAZOLE (FLAGYL) 500 MG tablet Take 1 tablet (500 mg total) by mouth 4 (four) times daily. (Patient not taking: Reported on 07/21/2020)  . montelukast (SINGULAIR) 10 MG tablet Take 10 mg by mouth every evening.  . Multiple Vitamin (MULTIVITAMIN) tablet Take 1 tablet by mouth daily.  . Olopatadine HCl 0.2 % SOLN Apply 1 drop to eye daily.  Marland Kitchen ROCKLATAN 0.02-0.005 % SOLN   . TART CHERRY PO Take 1 tablet by mouth daily.  Marland Kitchen VITAMIN D PO Take by mouth.   No facility-administered encounter medications on file as of 08/30/2020.    Current Diagnosis: Patient Active Problem List   Diagnosis Date Noted  . Acute diarrhea 07/12/2020  . Myalgia 06/01/2020  . S/P partial thyroidectomy 09/08/2019  . 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 Degenerative arthritis of left knee 05/29/2019  . Talar dome fracture 05/22/2019  . Atypical mole 01/22/2018  . Chronic diastolic (congestive) heart failure ([place]) 05/10/2017  . DDD (degenerative disc disease), lumbar 01/10/2017  . 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  Goiter 03/17/2014  . Routine health maintenance 10/19/2012  . Elevated LDL cholesterol level 10/17/2012  . Gout, unspecified 09/22/2008  . Unspecified glaucoma 09/22/2008  . Essential hypertension 07/12/2007  . OSTEOARTHROSIS, LOCAL, PRIMARY, LOWER LEG 07/12/2007    Goals Addressed   None     Follow-Up:  Pharmacist Review   Spoke with patient who states that she is doing ok but one of her biggest concerns is that she does not feel that she is getting adequate care at her doctors office. She states that she has had issues with not being able to get a physical, the  wellness nurse not calling her back for an appointment, issues with her back and a couple falls where she had to drive to [facility] just be be seen. She would like to discuss some of these issues with someone before she decides to change health care provider.  [name], Clinical Pharmacist Assistant [facility]

## 2020-09-03 ENCOUNTER — Telehealth: Payer: Self-pay | Admitting: Internal Medicine

## 2020-09-03 ENCOUNTER — Telehealth: Payer: Self-pay | Admitting: *Deleted

## 2020-09-03 MED ORDER — TRAMADOL HCL 50 MG PO TABS
50.0000 mg | ORAL_TABLET | Freq: Three times a day (TID) | ORAL | 0 refills | Status: DC | PRN
Start: 2020-09-03 — End: 2021-03-23

## 2020-09-03 NOTE — Telephone Encounter (Signed)
Called pt and relayed Dr. Clovis Riley message.  Pt states that she never got the colchicine and I advise her that she needs to pick up that medication and take it daily for next 1-2 weeks.  Additionally I advise her that Dr. Georgina Snell has also prescribed Tramadol that she is to use very sparingly and only at bedtime.

## 2020-09-03 NOTE — Telephone Encounter (Signed)
LVM for pt to rtn my call to schedule AWV with [facility]. Please schedule this appt if pt calls the office.  °

## 2020-09-03 NOTE — Telephone Encounter (Signed)
Pt called stating that her knee pain has been waking her up at night and she would like a rx sent into [facility].

## 2020-09-03 NOTE — Telephone Encounter (Signed)
Were you able to get the gout medicine I prescribed previously?  Has that helped any?  I have prescribed tramadol.  This is a stronger medicine for pain.  Use this with caution sparingly at bedtime.

## 2020-09-07 DIAGNOSIS — R946 Abnormal results of thyroid function studies: Secondary | ICD-10-CM | POA: Diagnosis not present

## 2020-09-14 DIAGNOSIS — I10 Essential (primary) hypertension: Secondary | ICD-10-CM | POA: Diagnosis not present

## 2020-09-14 DIAGNOSIS — Z8639 Personal history of other endocrine, nutritional and metabolic disease: Secondary | ICD-10-CM | POA: Diagnosis not present

## 2020-09-14 DIAGNOSIS — R946 Abnormal results of thyroid function studies: Secondary | ICD-10-CM | POA: Diagnosis not present

## 2020-09-15 ENCOUNTER — Ambulatory Visit (AMBULATORY_SURGERY_CENTER): Payer: Self-pay

## 2020-09-15 ENCOUNTER — Encounter: Payer: Self-pay | Admitting: Gastroenterology

## 2020-09-15 ENCOUNTER — Other Ambulatory Visit: Payer: Self-pay

## 2020-09-15 VITALS — Ht 66.0 in | Wt 162.8 lb

## 2020-09-15 DIAGNOSIS — Z86010 Personal history of colonic polyps: Secondary | ICD-10-CM

## 2020-09-15 NOTE — Progress Notes (Signed)
No egg or soy allergy known to patient  No issues with past sedation with any surgeries or procedures No intubation problems in the past  No FH of Malignant Hyperthermia No diet pills per patient No home 02 use per patient  No blood thinners per patient  Pt denies issues with constipation  No A fib or A flutter  COVID 19 guidelines implemented in [facility] today with Pt and RN  Coupon given to pt in [facility] today , Code to Pharmacy  COVID vaccines completed on 12/2019 per pt;  Due to the COVID-19 pandemic we are asking patients to follow these guidelines. Please only bring one care partner. Please be aware that your care partner may wait in the car in the parking lot or if they feel like they will be too hot to wait in the car, they may wait in the lobby on the 4th floor. All care partners are required to wear a mask the entire time (we do not have any that we can provide them), they need to practice social distancing, and we will do a Covid check for all patient's and care partners when you arrive. Also we will check their temperature and your temperature. If the care partner waits in their car they need to stay in the parking lot the entire time and we will call them on their cell phone when the patient is ready for discharge so they can bring the car to the front of the building. Also all patient's will need to wear a mask into building.

## 2020-09-22 ENCOUNTER — Encounter: Payer: Self-pay | Admitting: Gastroenterology

## 2020-09-22 ENCOUNTER — Other Ambulatory Visit: Payer: Self-pay

## 2020-09-22 ENCOUNTER — Ambulatory Visit (AMBULATORY_SURGERY_CENTER): Payer: Medicare Other | Admitting: Gastroenterology

## 2020-09-22 ENCOUNTER — Encounter: Payer: Medicare Other | Admitting: Gastroenterology

## 2020-09-22 ENCOUNTER — Ambulatory Visit: Payer: Medicare Other | Admitting: Family Medicine

## 2020-09-22 VITALS — BP 122/65 | HR 56 | Temp 97.3°F | Resp 21 | Ht 66.0 in | Wt 162.0 lb

## 2020-09-22 DIAGNOSIS — Z86010 Personal history of colonic polyps: Secondary | ICD-10-CM | POA: Diagnosis not present

## 2020-09-22 MED ORDER — SODIUM CHLORIDE 0.9 % IV SOLN: 500.0000 mL | INTRAVENOUS | Status: DC

## 2020-09-22 NOTE — Progress Notes (Signed)
Report given to PACU, vss 

## 2020-09-22 NOTE — Op Note (Signed)
[facility] Patient Name: Jennifer Huang Procedure Date: 09/22/2020 10:35 AM MRN: [id number] Endoscopist: Milus Banister , MD Age: 69 Referring MD:  Date of Birth: Aug 29, 1951 Gender: Female Account #: [id number] Procedure:                Colonoscopy Indications:              High risk colon cancer surveillance: Personal                            history of colonic polyps; Colonoscopy 2006 single                            subCM adenoma. Colonsocopy 2011 normal. Colonoscopy                            09/2015 two subCM adenomas Medicines:                Monitored Anesthesia Care Procedure:                Pre-Anesthesia Assessment:                           - Prior to the procedure, a History and Physical                            was performed, and patient medications and                            allergies were reviewed. The patient's tolerance of                            previous anesthesia was also reviewed. The risks                            and benefits of the procedure and the sedation                            options and risks were discussed with the patient.                            All questions were answered, and informed consent                            was obtained. Prior Anticoagulants: The patient has                            taken no previous anticoagulant or antiplatelet                            agents. ASA Grade Assessment: II - A patient with                            mild systemic disease. After reviewing the risks  and benefits, the patient was deemed in                            satisfactory condition to undergo the procedure.                           After obtaining informed consent, the colonoscope                            was passed under direct vision. Throughout the                            procedure, the patient's blood pressure, pulse, and                            oxygen saturations were monitored  continuously. The                            Colonoscope was introduced through the anus and                            advanced to the the cecum, identified by                            appendiceal orifice and ileocecal valve. The                            colonoscopy was performed without difficulty. The                            patient tolerated the procedure well. The quality                            of the bowel preparation was good. The ileocecal                            valve, appendiceal orifice, and rectum were                            photographed. Scope In: 10:41:49 AM Scope Out: 10:52:21 AM Scope Withdrawal Time: 0 hours 6 minutes 32 seconds  Total Procedure Duration: 0 hours 10 minutes 32 seconds  Findings:                 The entire examined colon appeared normal on direct                            and retroflexion views. Complications:            No immediate complications. Estimated blood loss:                            None. Estimated Blood Loss:     Estimated blood loss: none. Impression:               - The entire examined colon is normal  on direct and                            retroflexion views.                           - No polyps or cancers. Recommendation:           - Patient has a contact number available for                            emergencies. The signs and symptoms of potential                            delayed complications were discussed with the                            patient. Return to normal activities tomorrow.                            Written discharge instructions were provided to the                            patient.                           - Resume previous diet.                           - Continue present medications.                           - Repeat colonoscopy in 10 years for screening. Milus Banister, MD 09/22/2020 10:55:16 AM This report has been signed electronically.

## 2020-09-22 NOTE — Patient Instructions (Signed)
Repeat colonoscopy in 10 years for screening purposes.  ° °YOU HAD AN ENDOSCOPIC PROCEDURE TODAY AT THE [facility]:   Refer to the procedure report that was given to you for any specific questions about what was found during the examination.  If the procedure report does not answer your questions, please call your gastroenterologist to clarify.  If you requested that your care partner not be given the details of your procedure findings, then the procedure report has been included in a sealed envelope for you to review at your convenience later. ° °YOU SHOULD EXPECT: Some feelings of bloating in the abdomen. Passage of more gas than usual.  Walking can help get rid of the air that was put into your GI tract during the procedure and reduce the bloating. If you had a lower endoscopy (such as a colonoscopy or flexible sigmoidoscopy) you may notice spotting of blood in your stool or on the toilet paper. If you underwent a bowel prep for your procedure, you may not have a normal bowel movement for a few days. ° °Please Note:  You might notice some irritation and congestion in your nose or some drainage.  This is from the oxygen used during your procedure.  There is no need for concern and it should clear up in a day or so. ° °SYMPTOMS TO REPORT IMMEDIATELY: ° °Following lower endoscopy (colonoscopy or flexible sigmoidoscopy): ° Excessive amounts of blood in the stool ° Significant tenderness or worsening of abdominal pains ° Swelling of the abdomen that is new, acute ° Fever of 100°F or higher ° °For urgent or emergent issues, a gastroenterologist can be reached at any hour by calling [phone]. °Do not use MyChart messaging for urgent concerns.  ° ° °DIET:  We do recommend a small meal at first, but then you may proceed to your regular diet.  Drink plenty of fluids but you should avoid alcoholic beverages for 24 hours. ° °ACTIVITY:  You should plan to take it easy for the rest of today and you should  NOT DRIVE or use heavy machinery until tomorrow (because of the sedation medicines used during the test).   ° °FOLLOW UP: °Our staff will call the number listed on your records 48-72 hours following your procedure to check on you and address any questions or concerns that you may have regarding the information given to you following your procedure. If we do not reach you, we will leave a message.  We will attempt to reach you two times.  During this call, we will ask if you have developed any symptoms of COVID 19. If you develop any symptoms (ie: fever, flu-like symptoms, shortness of breath, cough etc.) before then, please call [phone].  If you test positive for Covid 19 in the 2 weeks post procedure, please call and report this information to us.   ° °If any biopsies were taken you will be contacted by phone or by letter within the next 1-3 weeks.  Please call us at [phone] if you have not heard about the biopsies in 3 weeks.  ° ° °SIGNATURES/CONFIDENTIALITY: °You and/or your care partner have signed paperwork which will be entered into your electronic medical record.  These signatures attest to the fact that that the information above on your After Visit Summary has been reviewed and is understood.  Full responsibility of the confidentiality of this discharge information lies with you and/or your care-partner. ° °

## 2020-09-24 ENCOUNTER — Telehealth: Payer: Self-pay | Admitting: *Deleted

## 2020-09-24 NOTE — Telephone Encounter (Signed)
  Follow up Call-  Call back number 09/22/2020  Post procedure Call Back phone  # [phone]  Permission to leave phone message Yes  Some recent data might be hidden     Patient questions:  Do you have a fever, pain , or abdominal swelling? No. Pain Score  0 *  Have you tolerated food without any problems? Yes.    Have you been able to return to your normal activities? Yes.    Do you have any questions about your discharge instructions: Diet   No. Medications  No. Follow up visit  No.  Do you have questions or concerns about your Care? No.  Actions: * If pain score is 4 or above: No action needed, pain <4.  1. Have you developed a fever since your procedure? no  2.   Have you had an respiratory symptoms (SOB or cough) since your procedure? no  3.   Have you tested positive for COVID 19 since your procedure no  4.   Have you had any family members/close contacts diagnosed with the COVID 19 since your procedure? no   If yes to any of these questions please route to Joylene John, RN and Joella Prince, RN

## 2020-09-25 ENCOUNTER — Other Ambulatory Visit: Payer: Self-pay | Admitting: Family Medicine

## 2020-09-28 ENCOUNTER — Other Ambulatory Visit: Payer: Self-pay

## 2020-09-28 ENCOUNTER — Encounter: Payer: Self-pay | Admitting: Family Medicine

## 2020-09-28 ENCOUNTER — Ambulatory Visit: Payer: Self-pay

## 2020-09-28 ENCOUNTER — Ambulatory Visit (INDEPENDENT_AMBULATORY_CARE_PROVIDER_SITE_OTHER): Payer: Medicare Other | Admitting: Internal Medicine

## 2020-09-28 ENCOUNTER — Encounter: Payer: Self-pay | Admitting: Internal Medicine

## 2020-09-28 ENCOUNTER — Ambulatory Visit (INDEPENDENT_AMBULATORY_CARE_PROVIDER_SITE_OTHER): Payer: Medicare Other

## 2020-09-28 ENCOUNTER — Ambulatory Visit: Payer: Medicare Other | Admitting: Family Medicine

## 2020-09-28 VITALS — BP 130/60 | HR 54 | Temp 98.2°F | Resp 16 | Ht 66.0 in | Wt 161.6 lb

## 2020-09-28 VITALS — BP 110/70 | HR 68 | Ht 66.0 in | Wt 162.0 lb

## 2020-09-28 VITALS — BP 130/60 | HR 54 | Temp 98.2°F | Ht 66.0 in | Wt 161.0 lb

## 2020-09-28 DIAGNOSIS — S8012XA Contusion of left lower leg, initial encounter: Secondary | ICD-10-CM | POA: Insufficient documentation

## 2020-09-28 DIAGNOSIS — I10 Essential (primary) hypertension: Secondary | ICD-10-CM | POA: Diagnosis not present

## 2020-09-28 DIAGNOSIS — R7989 Other specified abnormal findings of blood chemistry: Secondary | ICD-10-CM | POA: Diagnosis not present

## 2020-09-28 DIAGNOSIS — S8012XD Contusion of left lower leg, subsequent encounter: Secondary | ICD-10-CM | POA: Diagnosis not present

## 2020-09-28 DIAGNOSIS — G8929 Other chronic pain: Secondary | ICD-10-CM

## 2020-09-28 DIAGNOSIS — Z970 Presence of artificial eye: Secondary | ICD-10-CM | POA: Diagnosis not present

## 2020-09-28 DIAGNOSIS — E890 Postprocedural hypothyroidism: Secondary | ICD-10-CM

## 2020-09-28 DIAGNOSIS — Z0000 Encounter for general adult medical examination without abnormal findings: Secondary | ICD-10-CM | POA: Diagnosis not present

## 2020-09-28 DIAGNOSIS — M25562 Pain in left knee: Secondary | ICD-10-CM

## 2020-09-28 DIAGNOSIS — M79605 Pain in left leg: Secondary | ICD-10-CM | POA: Diagnosis not present

## 2020-09-28 LAB — T4, FREE: Free T4: 0.9 ng/dL (ref 0.60–1.60)

## 2020-09-28 MED ORDER — AMLODIPINE BESYLATE 10 MG PO TABS: 10.0000 mg | ORAL_TABLET | Freq: Every day | ORAL | 3 refills | Status: DC

## 2020-09-28 MED ORDER — FUROSEMIDE 20 MG PO TABS
ORAL_TABLET | ORAL | 3 refills | Status: DC
Start: 2020-09-28 — End: 2020-10-04

## 2020-09-28 NOTE — Assessment & Plan Note (Signed)
Patient does have what appears to still be a left leg contusion.  Discussed with patient in great length.  Due to the longevity of this I would like to get a D-dimer to rule out anything such as a superficial phlebitis.  Discussed the potential for antibiotics which patient declined.  Does think it is getting better.  Discussed topical medicine, heat and massage, and compression.  Follow-up again in 4 to 6 weeks

## 2020-09-28 NOTE — Progress Notes (Signed)
   Subjective:   Patient ID: Jennifer Huang, female    DOB: January 02, 1951, 69 y.o.   MRN: [id number]  HPI The patient is a 69 YO female coming in for physical.   PMH, [place], social history reviewed and updated  Review of Systems  Constitutional: Negative.   HENT: Negative.   Eyes: Negative.   Respiratory: Negative for cough, chest tightness and shortness of breath.   Cardiovascular: Negative for chest pain, palpitations and leg swelling.  Gastrointestinal: Negative for abdominal distention, abdominal pain, constipation, diarrhea, nausea and vomiting.  Musculoskeletal: Positive for arthralgias and back pain.  Skin: Negative.   Neurological: Negative.   Psychiatric/Behavioral: Negative.     Objective:  Physical Exam Constitutional:      Appearance: She is well-developed.  HENT:     Head: Normocephalic and atraumatic.     Comments: Right eye artificial Cardiovascular:     Rate and Rhythm: Normal rate and regular rhythm.  Pulmonary:     Effort: Pulmonary effort is normal. No respiratory distress.     Breath sounds: Normal breath sounds. No wheezing or rales.  Abdominal:     General: Bowel sounds are normal. There is no distension.     Palpations: Abdomen is soft.     Tenderness: There is no abdominal tenderness. There is no rebound.  Musculoskeletal:     Cervical back: Normal range of motion.  Skin:    General: Skin is warm and dry.  Neurological:     Mental Status: She is alert and oriented to person, place, and time.     Coordination: Coordination normal.     Vitals:   09/28/20 1325  BP: 130/60  Pulse: (!) 54  Temp: 98.2 F (36.8 C)  TempSrc: Oral  SpO2: 97%  Weight: 161 lb (73 kg)  Height: 5\' 6"  (1.676 m)    This visit occurred during the SARS-CoV-2 public health emergency.  Safety protocols were in place, including screening questions prior to the visit, additional usage of staff PPE, and extensive cleaning of exam room while observing appropriate contact time  as indicated for disinfecting solutions.   Assessment & Plan:

## 2020-09-28 NOTE — Addendum Note (Signed)
Addended by: Cresenciano Lick on: 09/28/2020 01:55 PM   Modules accepted: Orders

## 2020-09-28 NOTE — Patient Instructions (Signed)
Jennifer Huang , Thank you for taking time to come for your Medicare Wellness Visit. I appreciate your ongoing commitment to your health goals. Please review the following plan we discussed and let me know if I can assist you in the future.   Screening recommendations/referrals: Colonoscopy: 09/22/2020; due every 10 years Mammogram: 10/07/2019 Bone Density: 07/28/2019; due every 2 years Recommended yearly ophthalmology/optometry visit for glaucoma screening and checkup Recommended yearly dental visit for hygiene and checkup  Vaccinations: Influenza vaccine: 07/05/2020 Pneumococcal vaccine: up to date Tdap vaccine: never done Shingles vaccine: never done Covid-19: up to date  Advanced directives: Please bring a copy of your health care power of attorney and living will to the office at your convenience.  Conditions/risks identified: Yes; Reviewed health maintenance screenings with patient today and relevant education, vaccines, and/or referrals were provided. Please continue to do your personal lifestyle choices by: daily care of teeth and gums, regular physical activity (goal should be 5 days a week for 30 minutes), eat a healthy diet, avoid tobacco and drug use, limiting any alcohol intake, taking a low-dose aspirin (if not allergic or have been advised by your provider otherwise) and taking vitamins and minerals as recommended by your provider. Continue doing brain stimulating activities (puzzles, reading, adult coloring books, staying active) to keep memory sharp. Continue to eat heart healthy diet (full of fruits, vegetables, whole grains, lean protein, water--limit salt, fat, and sugar intake) and increase physical activity as tolerated.  Next appointment: Please schedule your next Medicare Wellness Visit with your Nurse Health Advisor in 1 year by calling [phone].   Preventive Care 14 Years and Older, Female Preventive care refers to lifestyle choices and visits with your health care  provider that can promote health and wellness. What does preventive care include?  A yearly physical exam. This is also called an annual well check.  Dental exams once or twice a year.  Routine eye exams. Ask your health care provider how often you should have your eyes checked.  Personal lifestyle choices, including:  Daily care of your teeth and gums.  Regular physical activity.  Eating a healthy diet.  Avoiding tobacco and drug use.  Limiting alcohol use.  Practicing safe sex.  Taking low-dose aspirin every day.  Taking vitamin and mineral supplements as recommended by your health care provider. What happens during an annual well check? The services and screenings done by your health care provider during your annual well check will depend on your age, overall health, lifestyle risk factors, and family history of disease. Counseling  Your health care provider may ask you questions about your:  Alcohol use.  Tobacco use.  Drug use.  Emotional well-being.  Home and relationship well-being.  Sexual activity.  Eating habits.  History of falls.  Memory and ability to understand (cognition).  Work and work Statistician.  Reproductive health. Screening  You may have the following tests or measurements:  Height, weight, and BMI.  Blood pressure.  Lipid and cholesterol levels. These may be checked every 5 years, or more frequently if you are over 42 years old.  Skin check.  Lung cancer screening. You may have this screening every year starting at age 45 if you have a 30-pack-year history of smoking and currently smoke or have quit within the past 15 years.  Fecal occult blood test (FOBT) of the stool. You may have this test every year starting at age 44.  Flexible sigmoidoscopy or colonoscopy. You may have a sigmoidoscopy every 5 years  or a colonoscopy every 10 years starting at age 26.  Hepatitis C blood test.  Hepatitis B blood test.  Sexually  transmitted disease (STD) testing.  Diabetes screening. This is done by checking your blood sugar (glucose) after you have not eaten for a while (fasting). You may have this done every 1-3 years.  Bone density scan. This is done to screen for osteoporosis. You may have this done starting at age 67.  Mammogram. This may be done every 1-2 years. Talk to your health care provider about how often you should have regular mammograms. Talk with your health care provider about your test results, treatment options, and if necessary, the need for more tests. Vaccines  Your health care provider may recommend certain vaccines, such as:  Influenza vaccine. This is recommended every year.  Tetanus, diphtheria, and acellular pertussis (Tdap, Td) vaccine. You may need a Td booster every 10 years.  Zoster vaccine. You may need this after age 69.  Pneumococcal 13-valent conjugate (PCV13) vaccine. One dose is recommended after age 41.  Pneumococcal polysaccharide (PPSV23) vaccine. One dose is recommended after age 24. Talk to your health care provider about which screenings and vaccines you need and how often you need them. This information is not intended to replace advice given to you by your health care provider. Make sure you discuss any questions you have with your health care provider. Document Released: 11/05/2015 Document Revised: 06/28/2016 Document Reviewed: 08/10/2015 Elsevier Interactive Patient Education  2017 [employer] Prevention in the Home Falls can cause injuries. They can happen to people of all ages. There are many things you can do to make your home safe and to help prevent falls. What can I do on the outside of my home?  Regularly fix the edges of walkways and driveways and fix any cracks.  Remove anything that might make you trip as you walk through a door, such as a raised step or threshold.  Trim any bushes or trees on the path to your home.  Use bright outdoor  lighting.  Clear any walking paths of anything that might make someone trip, such as rocks or tools.  Regularly check to see if handrails are loose or broken. Make sure that both sides of any steps have handrails.  Any raised decks and porches should have guardrails on the edges.  Have any leaves, snow, or ice cleared regularly.  Use sand or salt on walking paths during winter.  Clean up any spills in your garage right away. This includes oil or grease spills. What can I do in the bathroom?  Use night lights.  Install grab bars by the toilet and in the tub and shower. Do not use towel bars as grab bars.  Use non-skid mats or decals in the tub or shower.  If you need to sit down in the shower, use a plastic, non-slip stool.  Keep the floor dry. Clean up any water that spills on the floor as soon as it happens.  Remove soap buildup in the tub or shower regularly.  Attach bath mats securely with double-sided non-slip rug tape.  Do not have throw rugs and other things on the floor that can make you trip. What can I do in the bedroom?  Use night lights.  Make sure that you have a light by your bed that is easy to reach.  Do not use any sheets or blankets that are too big for your bed. They should not hang down  onto the floor.  Have a firm chair that has side arms. You can use this for support while you get dressed.  Do not have throw rugs and other things on the floor that can make you trip. What can I do in the kitchen?  Clean up any spills right away.  Avoid walking on wet floors.  Keep items that you use a lot in easy-to-reach places.  If you need to reach something above you, use a strong step stool that has a grab bar.  Keep electrical cords out of the way.  Do not use floor polish or wax that makes floors slippery. If you must use wax, use non-skid floor wax.  Do not have throw rugs and other things on the floor that can make you trip. What can I do with my  stairs?  Do not leave any items on the stairs.  Make sure that there are handrails on both sides of the stairs and use them. Fix handrails that are broken or loose. Make sure that handrails are as long as the stairways.  Check any carpeting to make sure that it is firmly attached to the stairs. Fix any carpet that is loose or worn.  Avoid having throw rugs at the top or bottom of the stairs. If you do have throw rugs, attach them to the floor with carpet tape.  Make sure that you have a light switch at the top of the stairs and the bottom of the stairs. If you do not have them, ask someone to add them for you. What else can I do to help prevent falls?  Wear shoes that:  Do not have high heels.  Have rubber bottoms.  Are comfortable and fit you well.  Are closed at the toe. Do not wear sandals.  If you use a stepladder:  Make sure that it is fully opened. Do not climb a closed stepladder.  Make sure that both sides of the stepladder are locked into place.  Ask someone to hold it for you, if possible.  Clearly mark and make sure that you can see:  Any grab bars or handrails.  First and last steps.  Where the edge of each step is.  Use tools that help you move around (mobility aids) if they are needed. These include:  Canes.  Walkers.  Scooters.  Crutches.  Turn on the lights when you go into a dark area. Replace any light bulbs as soon as they burn out.  Set up your furniture so you have a clear path. Avoid moving your furniture around.  If any of your floors are uneven, fix them.  If there are any pets around you, be aware of where they are.  Review your medicines with your doctor. Some medicines can make you feel dizzy. This can increase your chance of falling. Ask your doctor what other things that you can do to help prevent falls. This information is not intended to replace advice given to you by your health care provider. Make sure you discuss any  questions you have with your health care provider. Document Released: 08/05/2009 Document Revised: 03/16/2016 Document Reviewed: 11/13/2014 Elsevier Interactive Patient Education  2017 [employer].

## 2020-09-28 NOTE — Patient Instructions (Signed)
Health Maintenance, Female Adopting a healthy lifestyle and getting preventive care are important in promoting health and wellness. Ask your health care provider about:  The right schedule for you to have regular tests and exams.  Things you can do on your own to prevent diseases and keep yourself healthy. What should I know about diet, weight, and exercise? Eat a healthy diet   Eat a diet that includes plenty of vegetables, fruits, low-fat dairy products, and lean protein.  Do not eat a lot of foods that are high in solid fats, added sugars, or sodium. Maintain a healthy weight Body mass index (BMI) is used to identify weight problems. It estimates body fat based on height and weight. Your health care provider can help determine your BMI and help you achieve or maintain a healthy weight. Get regular exercise Get regular exercise. This is one of the most important things you can do for your health. Most adults should:  Exercise for at least 150 minutes each week. The exercise should increase your heart rate and make you sweat (moderate-intensity exercise).  Do strengthening exercises at least twice a week. This is in addition to the moderate-intensity exercise.  Spend less time sitting. Even light physical activity can be beneficial. Watch cholesterol and blood lipids Have your blood tested for lipids and cholesterol at 69 years of age, then have this test every 5 years. Have your cholesterol levels checked more often if:  Your lipid or cholesterol levels are high.  You are older than 69 years of age.  You are at high risk for heart disease. What should I know about cancer screening? Depending on your health history and family history, you may need to have cancer screening at various ages. This may include screening for:  Breast cancer.  Cervical cancer.  Colorectal cancer.  Skin cancer.  Lung cancer. What should I know about heart disease, diabetes, and high blood  pressure? Blood pressure and heart disease  High blood pressure causes heart disease and increases the risk of stroke. This is more likely to develop in people who have high blood pressure readings, are of African descent, or are overweight.  Have your blood pressure checked: ? Every 3-5 years if you are 18-39 years of age. ? Every year if you are 40 years old or older. Diabetes Have regular diabetes screenings. This checks your fasting blood sugar level. Have the screening done:  Once every three years after age 40 if you are at a normal weight and have a low risk for diabetes.  More often and at a younger age if you are overweight or have a high risk for diabetes. What should I know about preventing infection? Hepatitis B If you have a higher risk for hepatitis B, you should be screened for this virus. Talk with your health care provider to find out if you are at risk for hepatitis B infection. Hepatitis C Testing is recommended for:  Everyone born from 1945 through 1965.  Anyone with known risk factors for hepatitis C. Sexually transmitted infections (STIs)  Get screened for STIs, including gonorrhea and chlamydia, if: ? You are sexually active and are younger than 69 years of age. ? You are older than 69 years of age and your health care provider tells you that you are at risk for this type of infection. ? Your sexual activity has changed since you were last screened, and you are at increased risk for chlamydia or gonorrhea. Ask your health care provider if   you are at risk.  Ask your health care provider about whether you are at high risk for HIV. Your health care provider may recommend a prescription medicine to help prevent HIV infection. If you choose to take medicine to prevent HIV, you should first get tested for HIV. You should then be tested every 3 months for as long as you are taking the medicine. Pregnancy  If you are about to stop having your period (premenopausal) and  you may become pregnant, seek counseling before you get pregnant.  Take 400 to 800 micrograms (mcg) of folic acid every day if you become pregnant.  Ask for birth control (contraception) if you want to prevent pregnancy. Osteoporosis and menopause Osteoporosis is a disease in which the bones lose minerals and strength with aging. This can result in bone fractures. If you are 65 years old or older, or if you are at risk for osteoporosis and fractures, ask your health care provider if you should:  Be screened for bone loss.  Take a calcium or vitamin D supplement to lower your risk of fractures.  Be given hormone replacement therapy (HRT) to treat symptoms of menopause. Follow these instructions at home: Lifestyle  Do not use any products that contain nicotine or tobacco, such as cigarettes, e-cigarettes, and chewing tobacco. If you need help quitting, ask your health care provider.  Do not use street drugs.  Do not share needles.  Ask your health care provider for help if you need support or information about quitting drugs. Alcohol use  Do not drink alcohol if: ? Your health care provider tells you not to drink. ? You are pregnant, may be pregnant, or are planning to become pregnant.  If you drink alcohol: ? Limit how much you use to 0-1 drink a day. ? Limit intake if you are breastfeeding.  Be aware of how much alcohol is in your drink. In the [place] one drink equals one 12 oz bottle of beer (355 mL), one 5 oz glass of wine (148 mL), or one 1 oz glass of hard liquor (44 mL). General instructions  Schedule regular health, dental, and eye exams.  Stay current with your vaccines.  Tell your health care provider if: ? You often feel depressed. ? You have ever been abused or do not feel safe at home. Summary  Adopting a healthy lifestyle and getting preventive care are important in promoting health and wellness.  Follow your health care provider's instructions about healthy  diet, exercising, and getting tested or screened for diseases.  Follow your health care provider's instructions on monitoring your cholesterol and blood pressure. This information is not intended to replace advice given to you by your health care provider. Make sure you discuss any questions you have with your health care provider. Document Revised: 10/02/2018 Document Reviewed: 10/02/2018 [employer] Patient Education  2020 [employer]  

## 2020-09-28 NOTE — Progress Notes (Signed)
Subjective:   Jennifer Huang is a 69 y.o. female who presents for Medicare Annual (Subsequent) preventive examination.  Review of Systems    No ROS. Medicare Wellness Visit. Additional risk factors are reflected in social history. Cardiac Risk Factors include: advanced age (>66men, >33 women);dyslipidemia;family history of premature cardiovascular disease;hypertension     Objective:    Today's Vitals   09/28/20 1225  BP: 130/60  Pulse: (!) 54  Resp: 16  Temp: 98.2 F (36.8 C)  SpO2: 97%  Weight: 161 lb 9.6 oz (73.3 kg)  Height: 5\' 6"  (1.676 m)  PainSc: 0-No pain   Body mass index is 26.08 kg/m.  Advanced Directives 09/28/2020 09/08/2019 09/01/2019 07/28/2019 07/25/2018 02/03/2017 10/24/2016  Does Patient Have a Medical Advance Directive? Yes No No No No No No  Type of Advance Directive [place]  Does patient want to make changes to medical advance directive? No - Patient declined - - - - - -  Copy of [place] in Chart? No - copy requested - - - - - -  Would patient like information on creating a medical advance directive? - No - Patient declined No - Patient declined Yes (ED - Information included in AVS) Yes (ED - Information included in AVS) No - Patient declined -    Current Medications (verified) Outpatient Encounter Medications as of 09/28/2020  Medication Sig  . amLODipine (NORVASC) 10 MG tablet TAKE 1 TABLET BY MOUTH  DAILY  . Ascorbic Acid (VITAMIN C PO) Take by mouth.  Marland Kitchen aspirin EC 81 MG tablet Take 81 mg by mouth daily. Swallow whole.   . Azelastine HCl 0.15 % SOLN Place 2 sprays into the nose every 12 (twelve) hours as needed.  . brimonidine (ALPHAGAN P) 0.1 % SOLN 3 (three) times daily.    Marland Kitchen CALCIUM PO Take by mouth.  . colchicine 0.6 MG tablet TAKE 1 TABLET (0.6 MG TOTAL) BY MOUTH DAILY AS NEEDED (GOUT PAIN).  Marland Kitchen diclofenac Sodium (VOLTAREN) 1 % GEL Apply 2 g topically 4 (four) times daily as needed.   .  dorzolamide-timolol (COSOPT) 22.3-6.8 MG/ML ophthalmic solution 1 drop 2 (two) times daily.    . fexofenadine (ALLEGRA) 180 MG tablet Take 180 mg by mouth daily.  . fluticasone (FLONASE) 50 MCG/ACT nasal spray Place 2 sprays into both nostrils daily.  . furosemide (LASIX) 20 MG tablet TAKE 1 TABLET BY MOUTH  DAILY IF NEEDED  . levothyroxine (SYNTHROID) 50 MCG tablet Take 50 mcg by mouth daily.  . montelukast (SINGULAIR) 10 MG tablet Take 10 mg by mouth every evening.   . Multiple Vitamin (MULTIVITAMIN) tablet Take 1 tablet by mouth daily.  . Olopatadine HCl 0.2 % SOLN Apply 1 drop to eye daily.  Marland Kitchen ROCKLATAN 0.02-0.005 % SOLN   . TART CHERRY PO Take 1 tablet by mouth daily.   . traMADol (ULTRAM) 50 MG tablet Take 1 tablet (50 mg total) by mouth every 8 (eight) hours as needed for severe pain.  . vitamin B-12 (CYANOCOBALAMIN) 100 MCG tablet Take 100 mcg by mouth daily.  Marland Kitchen VITAMIN D PO Take by mouth.  . cyclobenzaprine (FLEXERIL) 5 MG tablet Take 1 tablet (5 mg total) by mouth 3 (three) times daily as needed for muscle spasms.  Marland Kitchen EPINEPHrine 0.3 mg/0.3 mL IJ SOAJ injection Inject 0.3 mg into the muscle as needed for anaphylaxis. GENERIC TWINJECT PREFERRED   . lidocaine (LIDODERM) 5 % Place 1  patch onto the skin daily. Remove & Discard patch within 12 hours or as directed by MD   No facility-administered encounter medications on file as of 09/28/2020.    Allergies (verified) Penicillins and Sulfonamide derivatives   History: Past Medical History:  Diagnosis Date  . Allergies   . Allergy    year around seasonal allergies  . Chronic diastolic (congestive) heart failure ([place]) 05/10/2017  . Colon polyps    anal polyp  . Diabetes mellitus without complication ([place])    pre DM-dieto controlled at this time;  . Gout, unspecified   . Hyperlipidemia    on meds  . Osteoporosis    per Dr. Sharlet [place] with proof from bone density scan   . Primary localized osteoarthrosis, lower leg   . Thyroid  disease 08/2019   RIGHT thyroid removed  . Thyroid mass    right  . Unspecified essential hypertension    on meds  . Unspecified glaucoma(365.9)    Left Eye   Past Surgical History:  Procedure Laterality Date  . COLON SURGERY     anal polyp excised gen'l anesthesia  . COLONOSCOPY  10-14-2010   jacobs-hx TA 2006  . COLONOSCOPY  2016   jacobs-TA x 2 -49yr recall-miralax good  . ENUCLEATION     OD 2nd to Glaucoma  . OOPHORECTOMY     Had Blockage  . POLYPECTOMY  2016   TA x 2  . SALPINGECTOMY     Had Blockage  . THYROIDECTOMY Right 09/08/2019   Procedure: RIGHT HEMI-THYROIDECTOMY;  Surgeon: Leta Baptist, MD;  Location: [facility];  Service: ENT;  Laterality: Right;  . THYROIDECTOMY, PARTIAL Right   . TOTAL ABDOMINAL HYSTERECTOMY  2002  . WISDOM TOOTH EXTRACTION     Family History  Problem Relation Age of Onset  . Osteoarthritis Mother   . Hypertension Mother   . Emphysema Father   . Diabetes Father   . Hypertension Father   . Arthritis Sister        hip replacments, wrist surgery  . Hyperlipidemia Sister   . Hypertension Sister   . Diabetes Brother   . Hypertension Brother   . Hyperlipidemia Brother   . Cancer - Other Brother   . Heart disease Brother   . Hypertension Brother   . Hyperlipidemia Brother   . Diabetes Brother   . Colon polyps Brother   . Diabetes Brother   . Heart disease Brother   . Hypertension Brother   . Hyperlipidemia Brother   . Colon polyps Brother   . Esophageal cancer Neg Hx   . Rectal cancer Neg Hx   . Stomach cancer Neg Hx    Social History   Socioeconomic History  . Marital status: Widowed    Spouse name: Not on file  . Number of children: 0  . Years of education: Not on file  . Highest education level: Not on file  Occupational History  . 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 Smoking status: Never Smoker  . Smokeless tobacco: Never Used  Vaping Use  .  Vaping Use: Never used  Substance and Sexual Activity  . Alcohol use: No    Alcohol/week: 0.0 standard drinks  . Drug use: No  . Sexual activity: Not Currently    Partners: Male    Birth control/protection: Surgical  Other Topics Concern  . Not on file  Social History Narrative   HSG, @ year  college. Married '73. No children. On disability - orthopedic: knee, back, shortened leg.. SO - self-employed.      Dec '12 - many stressors: husband with cancer, brother with pancreatic cancer, housing and financial issues.             Social Determinants of Health   Financial Resource Strain: Medium Risk  . Difficulty of Paying Living Expenses: Somewhat hard  Food Insecurity:   . Worried About Charity fundraiser in the Last Year: Not on file  . Ran Out of Food in the Last Year: Not on file  Transportation Needs:   . Lack of Transportation (Medical): Not on file  . Lack of Transportation (Non-Medical): Not on file  Physical Activity:   . Days of Exercise per Week: Not on file  . Minutes of Exercise per Session: Not on file  Stress:   . Feeling of Stress : Not on file  Social Connections:   . Frequency of Communication with Friends and Family: Not on file  . Frequency of Social Gatherings with Friends and Family: Not on file  . Attends Religious Services: Not on file  . Active Member of Clubs or Organizations: Not on file  . Attends Archivist Meetings: Not on file  . Marital Status: Not on file    Tobacco Counseling Counseling given: Not Answered   Clinical Intake:  Pre-visit preparation completed: Yes  Pain : No/denies pain Pain Score: 0-No pain     BMI - recorded: 26.08 Nutritional Status: BMI 25 -29 Overweight Nutritional Risks: None Diabetes: No  How often do you need to have someone help you when you read instructions, pamphlets, or other written materials from your doctor or pharmacy?: 1 - Never What is the last grade level you completed in school?:  HSG; [employer]  Diabetic? no  Interpreter Needed?: No  Information entered by :: Lisette Abu, LPN   Activities of Daily Living In your present state of health, do you have any difficulty performing the following activities: 09/28/2020  Hearing? N  Vision? N  Difficulty concentrating or making decisions? N  Walking or climbing stairs? N  Dressing or bathing? N  Doing errands, shopping? N  Preparing Food and eating ? N  Using the Toilet? N  In the past six months, have you accidently leaked urine? N  Do you have problems with loss of bowel control? N  Managing your Medications? N  Managing your Finances? N  Housekeeping or managing your Housekeeping? N  Some recent data might be hidden    Patient Care Team: Hoyt Koch, MD as PCP - General (Internal Medicine) Lomax, Marny Lowenstein, MD (Inactive) (Obstetrics and Gynecology) Marily Memos, MD (Orthopedic Surgery) Marylynn Pearson, MD (Ophthalmology) Mosetta Anis, MD (Allergy) Rulon Sera, MD (Rehabilitation) Golden Circle, MD as Referring Physician (Ophthalmology) Leta Baptist, MD as Consulting Physician (Otolaryngology) Golden Circle, MD as Referring Physician (Ophthalmology) Charlton Haws, [facility] as Pharmacist (Pharmacist)  Indicate any recent Medical Services you may have received from other than [facility] providers in the past year (date may be approximate).     Assessment:   This is a routine wellness examination for [facility].  Hearing/Vision screen No exam data present  Dietary issues and exercise activities discussed: Current Exercise Habits: Home exercise routine, Type of exercise: walking, Time (Minutes): 30, Frequency (Times/Week): 5, Weekly Exercise (Minutes/Week): 150, Intensity: Mild, Exercise limited by: orthopedic condition(s)  Goals    .  Keep Pain Under Control         -  plan exercise or activity when pain is best controlled - stay active - track times pain is worst  and when it is best - use ice or heat for pain relief    Why is this important?   Day-to-day life can be hard when you have back pain.  Pain medicine is just one piece of the treatment puzzle. There are many things you can do to manage pain and keep your back strong.   Lifestyle changes, like stopping smoking and eating foods with Vitamin D and calcium, keep your bones and muscles healthy. Your back is better when it is supported by strong muscles.  You can try these action steps to help you manage your pain.     Notes:     Marland Kitchen  Manage My Medicine         - keep a list of all the medicines I take; vitamins and herbals too - use a pillbox to sort medicine    Why is this important?   These steps will help you keep on track with your medicines.    Notes:     .  Patient Stated      I want to start to set my alarm so I will not forget to take my glaucoma eye drops. Continue to stay in touch with my friends and church family.    .  Patient Stated (pt-stated)      To continue to be proactive with my health and independent.    .  Pharmacy Care Plan      CARE PLAN ENTRY (see longitudinal plan of care for additional care plan information)  Current Barriers:  . Chronic Disease Management support, education, and care coordination needs related to Hypertension, Hyperlipidemia, Heart Failure, and Osteopenia   Hypertension / Heart Failure BP Readings from Last 3 Encounters:  03/09/20 112/68  02/02/20 124/70  09/09/19 122/64 .  Pharmacist Clinical Goal(s): o Over the next 180 days, patient will work with PharmD and providers to maintain BP goal <130/80 . Current regimen:  o Amodipine 10 mg daily o Furosemide 20 mg daily PRN . Interventions: o Discussed BP goals and benefits of medications for prevention of heart attack / stroke . Patient self care activities - Over the next 180 days, patient will: o Check BP as needed, document, and provide at future appointments o Ensure daily salt  intake < 2300 mg/day  Hyperlipidemia Lab Results  Component Value Date/Time   LDLCALC 139 (H) 07/25/2018 10:39 AM   LDLDIRECT 129.6 10/17/2012 03:05 PM .  Pharmacist Clinical Goal(s): o Over the next 180 days, patient will work with PharmD and providers to achieve LDL goal < 100 . Current regimen:  o No medications . Interventions: o Discussed cholesterol goals and benefits of healthy lifestyle for prevention of heart attack / stroke . Patient self care activities - Over the next 180 days, patient will: o Continue low cholesterol diet o Try yoga a few times a week  Osteopenia . Pharmacist Clinical Goal(s) o Over the next 180 days, patient will work with PharmD and providers to optimize therapy . Current regimen:  o Calcium o Vitamin D . Interventions: o Advised to move calcium to bedtime to avoid interaction with levothyroxine . Patient self care activities - Over the next 180 days, patient will: o Continue medications as directed  Medication management . Pharmacist Clinical Goal(s): o Over the next 180 days, patient will work with PharmD and providers to maintain optimal medication adherence . Current  pharmacy: [facility] mail order . Interventions o Comprehensive medication review performed. o Continue current medication management strategy o Consult insurance formulary for Epi-Pen . Patient self care activities - Over the next 180 days, patient will: o Focus on medication adherence by fill date o Take medications as prescribed o Report any questions or concerns to PharmD and/or provider(s)  Initial goal documentation    .  Track and Manage Fluids and Swelling         - do ankle pumps when sitting - keep legs up while sitting - use salt in moderation - watch for swelling in feet, ankles and legs every day - weigh myself daily    Why is this important?   It is important to check your weight daily and watch how much salt and liquids you have.  It will help you to  manage your heart failure.    Notes:       Depression Screen PHQ 2/9 Scores 09/28/2020 07/28/2019 07/25/2018 07/24/2017 04/28/2016  PHQ - 2 Score 0 0 1 0 0  PHQ- 9 Score - - 3 - -    Fall Risk Fall Risk  09/28/2020 07/28/2019 07/25/2018 07/24/2017 04/28/2016  Falls in the past year? 1 1 Yes Yes Yes  Number falls in past yr: 1 0 1 1 1   Injury with Fall? 1 1 Yes Yes Yes  Risk for fall due to : Orthopedic patient;Impaired balance/gait - Impaired mobility - -  Follow up Falls evaluation completed;Education provided - Falls prevention discussed;Education provided - -    FALL RISK PREVENTION PERTAINING TO THE HOME:  Any stairs in or around the home? Yes  If so, are there any without handrails? No  Home free of loose throw rugs in walkways, pet beds, electrical cords, etc? Yes  Adequate lighting in your home to reduce risk of falls? Yes   ASSISTIVE DEVICES UTILIZED TO PREVENT FALLS:  Life alert? No  Use of a cane, walker or w/c? No  Grab bars in the bathroom? Yes  Shower chair or bench in shower? Yes  Elevated toilet seat or a handicapped toilet? Yes   TIMED UP AND GO:  Was the test performed? No .  Length of time to ambulate 10 feet: 0 sec.   Gait slow and steady without use of assistive device  Cognitive Function: Normal cognitive status assessed by direct observation by this Nurse Health Advisor. No abnormalities found.   MMSE - Mini Mental State Exam 07/25/2018  Orientation to time 5  Orientation to Place 5  Registration 3  Attention/ Calculation 5  Recall 1  Language- name 2 objects 2  Language- repeat 1  Language- follow 3 step command 3  Language- read & follow direction 1  Write a sentence 1  Copy design 1  Total score 28     6CIT Screen 07/28/2019  What Year? 0 points  What month? 0 points  What time? 0 points  Count back from 20 0 points  Months in reverse 0 points  Repeat phrase 2 points  Total Score 2    Immunizations Immunization History  Administered  Date(s) Administered  . Fluad Quad(high Dose 65+) 07/28/2019, 07/05/2020  . Influenza Whole 11/09/2009  . Influenza, High Dose Seasonal PF 08/31/2016, 07/24/2017, 07/25/2018  . Influenza, Seasonal, Injecte, Preservative Fre 10/17/2012  . Influenza,inj,Quad PF,6+ Mos 07/10/2014  . Moderna SARS-COVID-2 Vaccination 12/05/2019, 01/05/2020  . Pneumococcal Conjugate-13 07/24/2017  . Pneumococcal Polysaccharide-23 03/28/2016  . Tetanus 10/17/2012    TDAP status: Up  to date  Flu Vaccine status: Up to date  Pneumococcal vaccine status: Up to date  Covid-19 vaccine status: Completed vaccines  Qualifies for Shingles Vaccine? Yes   Zostavax completed No   Shingrix Completed?: No.    Education has been provided regarding the importance of this vaccine. Patient has been advised to call insurance company to determine out of pocket expense if they have not yet received this vaccine. Advised may also receive vaccine at local pharmacy or Health Dept. Verbalized acceptance and understanding.  Screening Tests Health Maintenance  Topic Date Due  . MAMMOGRAM  10/06/2021  . TETANUS/TDAP  10/17/2022  . COLONOSCOPY  09/22/2025  . INFLUENZA VACCINE  Completed  . DEXA SCAN  Completed  . COVID-19 Vaccine  Completed  . Hepatitis C Screening  Completed  . PNA vac Low Risk Adult  Completed    Health Maintenance  There are no preventive care reminders to display for this patient.  Colorectal cancer screening: Type of screening: Colonoscopy. Completed 09/22/2020. Repeat every 10 years  Mammogram status: Completed 10/13/2020. Repeat every year  Bone Density status: Completed 07/28/2019. Results reflect: Bone density results: OSTEOPENIA. Repeat every 2 years.  Lung Cancer Screening: (Low Dose CT Chest recommended if Age 20-80 years, 30 pack-year currently smoking OR have quit w/in 15years.) does not qualify.   Lung Cancer Screening Referral: no  Additional Screening:  Hepatitis C Screening: does  qualify; Completed yes  Vision Screening: Recommended annual ophthalmology exams for early detection of glaucoma and other disorders of the eye. Is the patient up to date with their annual eye exam?  Yes  Who is the provider or what is the name of the office in which the patient attends annual eye exams? [facility], MD If pt is not established with a provider, would they like to be referred to a provider to establish care? No .   Dental Screening: Recommended annual dental exams for proper oral hygiene  Community Resource Referral / Chronic Care Management: CRR required this visit?  No   CCM required this visit?  No      Plan:     I have personally reviewed and noted the following in the patient's chart:   . Medical and social history . Use of alcohol, tobacco or illicit drugs  . Current medications and supplements . Functional ability and status . Nutritional status . Physical activity . Advanced directives . List of other physicians . Hospitalizations, surgeries, and ER visits in previous 12 months . Vitals . Screenings to include cognitive, depression, and falls . Referrals and appointments  In addition, I have reviewed and discussed with patient certain preventive protocols, quality metrics, and best practice recommendations. A written personalized care plan for preventive services as well as general preventive health recommendations were provided to patient.     Sheral Flow, LPN   06/29/2835   Nurse Notes: n/a

## 2020-09-28 NOTE — Patient Instructions (Addendum)
Good to see you  Get labs on way out D dimer Potentially knee high compress to help if wanting Arnica lotion Likely will take more time See me again in 6-8 week or call if things change drastically

## 2020-09-28 NOTE — Progress Notes (Signed)
[name] [place] Phone: [phone] Subjective:   Jennifer Huang, am serving as a scribe for Dr. Hulan Saas. This visit occurred during the SARS-CoV-2 public health emergency.  Safety protocols were in place, including screening questions prior to the visit, additional usage of staff PPE, and extensive cleaning of exam room while observing appropriate contact time as indicated for disinfecting solutions.   I'm seeing this patient by the request  of:  Hoyt Koch, MD  CC: Left ankle and leg pain follow-up  [id number]    08/18/2020- saw Dr. Georgina Snell  left knee pain thought to be due to gout exacerbation.  Plan for steroid injection and restart colchicine which is tolerated previously. I reviewed her creatinine obtained in August of this year which was normal.  Uric acid has not been checked since 2013 we will update uric acid lab today.  Prescribed colchicine recheck back as needed.  Left leg seroma improving both symptomatically and in size appearance on ultrasound.  Plan to continue compression and recheck as needed.  If does not resolve would consider aspiration and injection but since it is improving will hopefully be able to avoid interventions on the seroma.  Update 09/27/2020  Jennifer Huang is a 69 y.o. female coming in with follow up of the L ankle states that the compression sleeve she was told to get for her calf has seemed to make the swelling worse in the L ankle. Patient states her left knee has good days and bad days, tries to keep up with her HEP and use ice when it gets too bad. Patient had questions about the colchicine. Is she supposed to take it all the time or only when needed or just that one time she got it. Patient states that the area where patient was hit initially has improved but continues to give her discomfort.  Patient wants to make sure she continues to get better.     Past  Medical History:  Diagnosis Date  . Allergies   . Allergy    year around seasonal allergies  . Chronic diastolic (congestive) heart failure ([place]) 05/10/2017  . Colon polyps    anal polyp  . Diabetes mellitus without complication ([place])    pre DM-dieto controlled at this time;  . Gout, unspecified   . Hyperlipidemia    on meds  . Osteoporosis    per Dr. Sharlet Salina with proof from bone density scan   . Primary localized osteoarthrosis, lower leg   . Thyroid disease 08/2019   RIGHT thyroid removed  . Thyroid mass    right  . Unspecified essential hypertension    on meds  . Unspecified glaucoma(365.9)    Left Eye   Past Surgical History:  Procedure Laterality Date  . COLON SURGERY     anal polyp excised gen'l anesthesia  . COLONOSCOPY  10-14-2010   jacobs-hx TA 2006  . COLONOSCOPY  2016   jacobs-TA x 2 -22yr recall-miralax good  . ENUCLEATION     OD 2nd to Glaucoma  . OOPHORECTOMY     Had Blockage  . POLYPECTOMY  2016   TA x 2  . SALPINGECTOMY     Had Blockage  . THYROIDECTOMY Right 09/08/2019   Procedure: RIGHT HEMI-THYROIDECTOMY;  Surgeon: Leta Baptist, MD;  Location: [facility];  Service: ENT;  Laterality: Right;  . TOTAL ABDOMINAL HYSTERECTOMY  2002  . [place] EXTRACTION     Social  History   Socioeconomic History  . Marital status: Widowed    Spouse name: Not on file  . Number of children: 0  . Years of education: Not on file  . Highest education level: Not on file  Occupational History  . 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 Smoking status: Never Smoker  . Smokeless tobacco: Never Used  Vaping Use  . Vaping Use: Never used  Substance and Sexual Activity  . Alcohol use: No    Alcohol/week: 0.0 standard drinks  . Drug use: No  . Sexual activity: Not Currently    Partners: Male    Birth control/protection: Surgical  Other Topics Concern  . Not on file  Social History Narrative    HSG, @ year college. Married '73. No children. On disability - orthopedic: knee, back, shortened leg.. SO - self-employed.      Dec '12 - many stressors: husband with cancer, brother with pancreatic cancer, housing and financial issues.             Social Determinants of Health   Financial Resource Strain: Medium Risk  . Difficulty of Paying Living Expenses: Somewhat hard  Food Insecurity:   . Worried About Charity fundraiser in the Last Year: Not on file  . Ran Out of Food in the Last Year: Not on file  Transportation Needs:   . Lack of Transportation (Medical): Not on file  . Lack of Transportation (Non-Medical): Not on file  Physical Activity:   . Days of Exercise per Week: Not on file  . Minutes of Exercise per Session: Not on file  Stress:   . Feeling of Stress : Not on file  Social Connections:   . Frequency of Communication with Friends and Family: Not on file  . Frequency of Social Gatherings with Friends and Family: Not on file  . Attends Religious Services: Not on file  . Active Member of Clubs or Organizations: Not on file  . Attends Archivist Meetings: Not on file  . Marital Status: Not on file   Allergies  Allergen Reactions  . Penicillins     REACTION: rash/hives  . Sulfonamide Derivatives     REACTION: rash/hives   Family History  Problem Relation Age of Onset  . Osteoarthritis Mother   . Hypertension Mother   . Emphysema Father   . Diabetes Father   . Hypertension Father   . Arthritis Sister        hip replacments, wrist surgery  . Hyperlipidemia Sister   . Hypertension Sister   . Diabetes Brother   . Hypertension Brother   . Hyperlipidemia Brother   . Cancer - Other Brother   . Heart disease Brother   . Hypertension Brother   . Hyperlipidemia Brother   . Diabetes Brother   . Colon polyps Brother   . Diabetes Brother   . Heart disease Brother   . Hypertension Brother   . Hyperlipidemia Brother   . Colon polyps Brother   .  Esophageal cancer Neg Hx   . Rectal cancer Neg Hx   . Stomach cancer Neg Hx     Current Outpatient Medications (Endocrine & Metabolic):  .  levothyroxine (SYNTHROID) 50 MCG tablet, Take 50 mcg by mouth daily.  Current Outpatient Medications (Cardiovascular):  .  amLODipine (NORVASC) 10 MG tablet, TAKE 1 TABLET BY MOUTH  DAILY .  furosemide (LASIX) 20 MG tablet, TAKE 1 TABLET  BY MOUTH  DAILY IF NEEDED .  EPINEPHrine 0.3 mg/0.3 mL IJ SOAJ injection, Inject 0.3 mg into the muscle as needed for anaphylaxis. GENERIC TWINJECT PREFERRED (Patient not taking: Reported on 09/15/2020)  Current Outpatient Medications (Respiratory):  Marland Kitchen  Azelastine HCl 0.15 % SOLN, Place 2 sprays into the nose every 12 (twelve) hours as needed. .  fexofenadine (ALLEGRA) 180 MG tablet, Take 180 mg by mouth daily. .  fluticasone (FLONASE) 50 MCG/ACT nasal spray, Place 2 sprays into both nostrils daily. .  montelukast (SINGULAIR) 10 MG tablet, Take 10 mg by mouth every evening.   Current Outpatient Medications (Analgesics):  .  aspirin EC 81 MG tablet, Take 81 mg by mouth daily. Swallow whole.  .  colchicine 0.6 MG tablet, TAKE 1 TABLET (0.6 MG TOTAL) BY MOUTH DAILY AS NEEDED (GOUT PAIN). Marland Kitchen  traMADol (ULTRAM) 50 MG tablet, Take 1 tablet (50 mg total) by mouth every 8 (eight) hours as needed for severe pain.  Current Outpatient Medications (Hematological):  .  vitamin B-12 (CYANOCOBALAMIN) 100 MCG tablet, Take 100 mcg by mouth daily.  Current Outpatient Medications (Other):  Marland Kitchen  Ascorbic Acid (VITAMIN C PO), Take by mouth. .  brimonidine (ALPHAGAN P) 0.1 % SOLN, 3 (three) times daily.   Marland Kitchen  CALCIUM PO, Take by mouth. .  diclofenac Sodium (VOLTAREN) 1 % GEL, Apply 2 g topically 4 (four) times daily as needed.  .  dorzolamide-timolol (COSOPT) 22.3-6.8 MG/ML ophthalmic solution, 1 drop 2 (two) times daily.   .  Multiple Vitamin (MULTIVITAMIN) tablet, Take 1 tablet by mouth daily. .  Olopatadine HCl 0.2 % SOLN, Apply 1  drop to eye daily. Marland Kitchen  ROCKLATAN 0.02-0.005 % SOLN,  .  TART CHERRY PO, Take 1 tablet by mouth daily.  Marland Kitchen  VITAMIN D PO, Take by mouth. .  cyclobenzaprine (FLEXERIL) 5 MG tablet, Take 1 tablet (5 mg total) by mouth 3 (three) times daily as needed for muscle spasms. (Patient not taking: Reported on 07/21/2020) .  lidocaine (LIDODERM) 5 %, Place 1 patch onto the skin daily. Remove & Discard patch within 12 hours or as directed by MD (Patient not taking: Reported on 09/22/2020)   Reviewed prior external information including notes and imaging from  primary care provider As well as notes that were available from care everywhere and other healthcare systems.  Past medical history, social, surgical and family history all reviewed in electronic medical record.  No pertanent information unless stated regarding to the chief complaint.   Review of Systems:  No headache, visual changes, nausea, vomiting, diarrhea, constipation, dizziness, abdominal pain, skin rash, fevers, chills, night sweats, weight loss, swollen lymph nodes, chest pain, shortness of breath, mood changes. POSITIVE muscle aches, joint swelling  Objective  Blood pressure 110/70, pulse 68, height 5\' 6"  (1.676 m), weight 162 lb (73.5 kg), SpO2 97 %.   General: No apparent distress alert and oriented x3 mood and affect normal, dressed appropriately.  Respiratory: Patient's speak in full sentences and does not appear short of breath  Cardiovascular: 1+ lower extremity edema, non tender, no erythema  Patient's left lower extremity shows the patient does have an area with some thickening noted over the distal middle third tibia area.  Tender to palpation in this area.  Still seems to be a contusion.  Surrounded with 1+ pitting edema but fairly symmetric to the contralateral side. Significant arthritic changes of the left knee as well as the left ankle.  Limited musculoskeletal ultrasound was performed and interpreted  by Lyndal Pulley   Limited ultrasound of patient's left tibia area shows the patient does have mostly soft tissue swelling noted.  There is an area that does seem to be resolving hematoma of her seroma.  Mild increase in Doppler flow surrounding the vicinity.  Varicose vein noted as well.   Impression and Recommendations:     The above documentation has been reviewed and is accurate and complete Lyndal Pulley, DO

## 2020-09-29 ENCOUNTER — Ambulatory Visit (HOSPITAL_COMMUNITY)
Admission: RE | Admit: 2020-09-29 | Discharge: 2020-09-29 | Disposition: A | Discharge status: Home / Self Care | Payer: Medicare Other | Source: Ambulatory Visit | Attending: Cardiovascular Disease | Admitting: Cardiovascular Disease

## 2020-09-29 ENCOUNTER — Other Ambulatory Visit: Payer: Self-pay

## 2020-09-29 DIAGNOSIS — M79605 Pain in left leg: Secondary | ICD-10-CM | POA: Insufficient documentation

## 2020-09-29 LAB — D-DIMER, QUANTITATIVE: D-Dimer, Quant: 0.73 mcg/mL FEU — ABNORMAL HIGH (ref <0.50)

## 2020-09-29 NOTE — Assessment & Plan Note (Signed)
Recent TSH elevated, checking T4 for adequacy of therapy with synthroid 50 mcg daily.

## 2020-09-29 NOTE — Assessment & Plan Note (Signed)
BP at goal on amlodipine and lasix. Reviewed labs with patient.

## 2020-09-29 NOTE — Assessment & Plan Note (Signed)
Flu shot complete. Covid-19 up to date including booster will let us know date of booster. Pneumonia complete. Shingrix counseled. Tetanus due 2023. Colonoscopy due 2031. Mammogram due 2023, pap smear aged out and dexa due 2025. Counseled about sun safety and mole surveillance. Counseled about the dangers of distracted driving. Given 10 year screening recommendations.

## 2020-09-29 NOTE — Assessment & Plan Note (Signed)
Stable and now following with [facility] for adjustments and cleaning.

## 2020-10-03 ENCOUNTER — Other Ambulatory Visit: Payer: Self-pay | Admitting: Internal Medicine

## 2020-10-06 DIAGNOSIS — H401122 Primary open-angle glaucoma, left eye, moderate stage: Secondary | ICD-10-CM | POA: Diagnosis not present

## 2020-10-06 DIAGNOSIS — H1013 Acute atopic conjunctivitis, bilateral: Secondary | ICD-10-CM | POA: Diagnosis not present

## 2020-10-12 ENCOUNTER — Other Ambulatory Visit: Payer: Self-pay | Admitting: Family Medicine

## 2020-10-12 DIAGNOSIS — Z1231 Encounter for screening mammogram for malignant neoplasm of breast: Secondary | ICD-10-CM | POA: Diagnosis not present

## 2020-10-12 NOTE — Telephone Encounter (Signed)
Please advise 

## 2020-10-18 ENCOUNTER — Telehealth: Payer: Self-pay | Admitting: Internal Medicine

## 2020-10-18 DIAGNOSIS — R300 Dysuria: Secondary | ICD-10-CM

## 2020-10-18 NOTE — Telephone Encounter (Signed)
Please advise in PCP's absence. Thanks! 

## 2020-10-18 NOTE — Telephone Encounter (Signed)
   Patient calling to report burning sensation with urination Can urine test be ordered

## 2020-10-19 ENCOUNTER — Other Ambulatory Visit: Payer: Self-pay

## 2020-10-19 ENCOUNTER — Other Ambulatory Visit (INDEPENDENT_AMBULATORY_CARE_PROVIDER_SITE_OTHER): Payer: Medicare Other

## 2020-10-19 DIAGNOSIS — R300 Dysuria: Secondary | ICD-10-CM | POA: Diagnosis not present

## 2020-10-19 NOTE — Telephone Encounter (Signed)
Patient called and was wondering if UA could be placed.

## 2020-10-19 NOTE — Telephone Encounter (Signed)
The order is placed. Thanks.

## 2020-10-19 NOTE — Telephone Encounter (Signed)
Okay UA.  Office visit with any provider.  Okay virtual.  Thanks

## 2020-10-19 NOTE — Telephone Encounter (Signed)
Pt informed of below.  

## 2020-10-20 DIAGNOSIS — M5116 Intervertebral disc disorders with radiculopathy, lumbar region: Secondary | ICD-10-CM | POA: Diagnosis not present

## 2020-10-20 LAB — URINALYSIS, ROUTINE W REFLEX MICROSCOPIC
Bilirubin Urine: NEGATIVE
Hgb urine dipstick: NEGATIVE
Ketones, ur: NEGATIVE
Nitrite: NEGATIVE
Specific Gravity, Urine: 1.02 (ref 1.000–1.030)
Total Protein, Urine: NEGATIVE
Urine Glucose: NEGATIVE
Urobilinogen, UA: 0.2 (ref 0.0–1.0)
pH: 5.5 (ref 5.0–8.0)

## 2020-10-21 ENCOUNTER — Telehealth (INDEPENDENT_AMBULATORY_CARE_PROVIDER_SITE_OTHER): Payer: Medicare Other | Admitting: Internal Medicine

## 2020-10-21 ENCOUNTER — Encounter: Payer: Self-pay | Admitting: Internal Medicine

## 2020-10-21 ENCOUNTER — Other Ambulatory Visit: Payer: Self-pay

## 2020-10-21 DIAGNOSIS — R300 Dysuria: Secondary | ICD-10-CM | POA: Diagnosis not present

## 2020-10-21 MED ORDER — NITROFURANTOIN MONOHYD MACRO 100 MG PO CAPS: 100.0000 mg | ORAL_CAPSULE | Freq: Two times a day (BID) | ORAL | 0 refills | Status: DC

## 2020-10-21 NOTE — Assessment & Plan Note (Signed)
Rx macrobid and encouraged fluids.

## 2020-10-21 NOTE — Progress Notes (Signed)
Virtual Visit via Audio Note  I connected with Jennifer Huang on 10/21/20 at 12:40 PM EST by an audio-only enabled telemedicine application and verified that I am speaking with the correct person using two identifiers.  The patient and the provider were at separate locations throughout the entire encounter. Patient location: home, Provider location: work   I discussed the limitations of evaluation and management by telemedicine and the availability of in person appointments. The patient expressed understanding and agreed to proceed. The patient and the provider were the only parties present for the visit unless noted in HPI below.  History of Present Illness: The patient is a 69 y.o. female with visit for burning with urination. Started Monday with burning. Has this after urinating and going more often. Denies fevers or chills. Overall it is not improving. Has tried nothing for it except more fluids and cranberry juice.   Observations/Objective: Voice strong, no coughing or dyspnea. A and O times 3  Assessment and Plan: See problem oriented charting  Follow Up Instructions: rx macrobid  Visit time 11 minutes in non-face to face communication with patient and coordination of care.  I discussed the assessment and treatment plan with the patient. The patient was provided an opportunity to ask questions and all were answered. The patient agreed with the plan and demonstrated an understanding of the instructions.   The patient was advised to call back or seek an in-person evaluation if the symptoms worsen or if the condition fails to improve as anticipated.  Myrlene Broker, MD

## 2020-11-09 ENCOUNTER — Ambulatory Visit: Payer: Medicare Other | Admitting: Family Medicine

## 2020-11-11 ENCOUNTER — Other Ambulatory Visit: Payer: Self-pay

## 2020-11-11 ENCOUNTER — Ambulatory Visit: Payer: Medicare Other | Admitting: Family Medicine

## 2020-11-11 ENCOUNTER — Encounter: Payer: Self-pay | Admitting: Family Medicine

## 2020-11-11 DIAGNOSIS — M1712 Unilateral primary osteoarthritis, left knee: Secondary | ICD-10-CM | POA: Diagnosis not present

## 2020-11-11 DIAGNOSIS — S8012XD Contusion of left lower leg, subsequent encounter: Secondary | ICD-10-CM | POA: Diagnosis not present

## 2020-11-11 NOTE — Assessment & Plan Note (Signed)
Patient did have injection in October 2021 and is still doing relatively well.  Patient's x-rays have always shown more of a mild to moderate arthritic changes.  We discussed the potential for an MRI of this as well as if he wanted to evaluate the contusion of the leg.  Patient at this point though is doing relatively well and wants to continue with conservative therapy.  Discussed with patient about this.  Patient would like to follow-up with me in 2 months with no other large changes at this time

## 2020-11-11 NOTE — Assessment & Plan Note (Signed)
Improved noted.  We discussed though that patient needs to continue massage so it does not seem to calcify in the area.  I believe that this will take another month or 2 to fully heal.  Patient will follow-up in 2 months already have ruled out any type of bone fracture or any type of clot.

## 2020-11-11 NOTE — Patient Instructions (Signed)
Check play it again sports Continue to be active Heat and massage in that area Get leg above heart when possible Lets consider PT or MRI for knee if it worsens See me in 2 months

## 2020-11-11 NOTE — Progress Notes (Signed)
[facility] Phone: [phone] Subjective:   [name], am serving as a scribe for Dr. Hulan Saas. This visit occurred during the SARS-CoV-2 public health emergency.  Safety protocols were in place, including screening questions prior to the visit, additional usage of staff PPE, and extensive cleaning of exam room while observing appropriate contact time as indicated for disinfecting solutions.   I'm seeing this patient by the request  of:  Hoyt Koch, MD  CC: Left leg and knee pain follow-up  [id number]   09/28/2020 Patient does have what appears to still be a left leg contusion.  Discussed with patient in great length.  Due to the longevity of this I would like to get a D-dimer to rule out anything such as a superficial phlebitis.  Discussed the potential for antibiotics which patient declined.  Does think it is getting better.  Discussed topical medicine, heat and massage, and compression.  Follow-up again in 4 to 6 weeks  Update 11/11/2020 Jennifer Huang is a 70 y.o. female coming in with complaint of left knee and leg pain. Patient states that she has stiffness in knee especially at night. Wears brace at night sometimes if knee pain becomes bad.   Soreness over distal tibia from hitting leg. Does notice swelling in this area.  Patient states though it is improving.  Seems to be very slow.   Patient last examined and had a mild elevation in D-dimer and did have a Doppler that was negative for any clot    Past Medical History:  Diagnosis Date  . Allergies   . Allergy    year around seasonal allergies  . Colon polyps    anal polyp  . Diabetes mellitus without complication ([place])    pre DM-dieto controlled at this time;  . Gout, unspecified   . Hyperlipidemia    on meds  . Osteoporosis    per Dr. Sharlet Salina with proof from bone density scan   . Primary localized osteoarthrosis, lower  leg   . Thyroid disease 08/2019   RIGHT thyroid removed  . Thyroid mass    right  . Unspecified essential hypertension    on meds  . Unspecified glaucoma(365.9)    Left Eye   Past Surgical History:  Procedure Laterality Date  . COLON SURGERY     anal polyp excised gen'l anesthesia  . COLONOSCOPY  10-14-2010   jacobs-hx TA 2006  . COLONOSCOPY  2016   jacobs-TA x 2 -41yr recall-miralax good  . ENUCLEATION     OD 2nd to Glaucoma  . OOPHORECTOMY     Had Blockage  . POLYPECTOMY  2016   TA x 2  . SALPINGECTOMY     Had Blockage  . THYROIDECTOMY Right 09/08/2019   Procedure: RIGHT HEMI-THYROIDECTOMY;  Surgeon: Leta Baptist, MD;  Location: [facility];  Service: ENT;  Laterality: Right;  . THYROIDECTOMY, PARTIAL Right   . TOTAL ABDOMINAL HYSTERECTOMY  2002  . WISDOM TOOTH EXTRACTION     Social History   Socioeconomic History  . Marital status: Widowed    Spouse name: Not on file  . Number of children: 0  . Years of education: Not on file  . Highest education level: Not on file  Occupational History  . 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 Smoking status: Never Smoker  . Smokeless tobacco: Never Used  Vaping Use  . Vaping Use: Never used  Substance and Sexual Activity  . Alcohol use: No    Alcohol/week: 0.0 standard drinks  . Drug use: No  . Sexual activity: Not Currently    Partners: Male    Birth control/protection: Surgical  Other Topics Concern  . Not on file  Social History Narrative   HSG, @ year college. Married '73. No children. On disability - orthopedic: knee, back, shortened leg.. SO - self-employed.      Dec '12 - many stressors: husband with cancer, brother with pancreatic cancer, housing and financial issues.             Social Determinants of Health   Financial Resource Strain: Low Risk   . Difficulty of Paying Living Expenses: Not hard at all  Food Insecurity: No Food Insecurity   . Worried About Charity fundraiser in the Last Year: Never true  . Ran Out of Food in the Last Year: Never true  Transportation Needs: No Transportation Needs  . Lack of Transportation (Medical): No  . Lack of Transportation (Non-Medical): No  Physical Activity: Sufficiently Active  . Days of Exercise per Week: 5 days  . Minutes of Exercise per Session: 30 min  Stress: No Stress Concern Present  . Feeling of Stress : Not at all  Social Connections: Moderately Integrated  . Frequency of Communication with Friends and Family: More than three times a week  . Frequency of Social Gatherings with Friends and Family: Once a week  . Attends Religious Services: More than 4 times per year  . Active Member of Clubs or Organizations: Yes  . Attends Archivist Meetings: More than 4 times per year  . Marital Status: Widowed   Allergies  Allergen Reactions  . Penicillins     REACTION: rash/hives  . Sulfonamide Derivatives     REACTION: rash/hives   Family History  Problem Relation Age of Onset  . Osteoarthritis Mother   . Hypertension Mother   . Emphysema Father   . Diabetes Father   . Hypertension Father   . Arthritis Sister        hip replacments, wrist surgery  . Hyperlipidemia Sister   . Hypertension Sister   . Diabetes Brother   . Hypertension Brother   . Hyperlipidemia Brother   . Cancer - Other Brother   . Heart disease Brother   . Hypertension Brother   . Hyperlipidemia Brother   . Diabetes Brother   . Colon polyps Brother   . Diabetes Brother   . Heart disease Brother   . Hypertension Brother   . Hyperlipidemia Brother   . Colon polyps Brother   . Esophageal cancer Neg Hx   . Rectal cancer Neg Hx   . Stomach cancer Neg Hx     Current Outpatient Medications (Endocrine & Metabolic):  .  levothyroxine (SYNTHROID) 50 MCG tablet, Take 50 mcg by mouth daily.  Current Outpatient Medications (Cardiovascular):  .  amLODipine (NORVASC) 10 MG tablet, Take 1  tablet (10 mg total) by mouth daily. Marland Kitchen  EPINEPHrine 0.3 mg/0.3 mL IJ SOAJ injection, Inject 0.3 mg into the muscle as needed for anaphylaxis. GENERIC TWINJECT PREFERRED  .  furosemide (LASIX) 20 MG tablet, TAKE 1 TABLET BY MOUTH  DAILY IF NEEDED  Current Outpatient Medications (Respiratory):  Marland Kitchen  Azelastine HCl 0.15 % SOLN, Place 2 sprays into the nose every 12 (twelve) hours as needed. .  fexofenadine (ALLEGRA) 180 MG tablet, Take 180  mg by mouth daily. .  fluticasone (FLONASE) 50 MCG/ACT nasal spray, Place 2 sprays into both nostrils daily. .  montelukast (SINGULAIR) 10 MG tablet, Take 10 mg by mouth every evening.   Current Outpatient Medications (Analgesics):  .  aspirin EC 81 MG tablet, Take 81 mg by mouth daily. Swallow whole.  .  colchicine 0.6 MG tablet, TAKE 1 TABLET (0.6 MG TOTAL) BY MOUTH DAILY AS NEEDED (GOUT PAIN). Marland Kitchen  traMADol (ULTRAM) 50 MG tablet, Take 1 tablet (50 mg total) by mouth every 8 (eight) hours as needed for severe pain.  Current Outpatient Medications (Hematological):  .  vitamin B-12 (CYANOCOBALAMIN) 100 MCG tablet, Take 100 mcg by mouth daily.  Current Outpatient Medications (Other):  Marland Kitchen  Ascorbic Acid (VITAMIN C PO), Take by mouth. .  brimonidine (ALPHAGAN P) 0.1 % SOLN, 3 (three) times daily. Marland Kitchen  CALCIUM PO, Take by mouth. .  cyclobenzaprine (FLEXERIL) 5 MG tablet, Take 1 tablet (5 mg total) by mouth 3 (three) times daily as needed for muscle spasms. .  diclofenac Sodium (VOLTAREN) 1 % GEL, Apply 2 g topically 4 (four) times daily as needed.  .  dorzolamide-timolol (COSOPT) 22.3-6.8 MG/ML ophthalmic solution, 1 drop 2 (two) times daily. Marland Kitchen  lidocaine (LIDODERM) 5 %, Place 1 patch onto the skin daily. Remove & Discard patch within 12 hours or as directed by MD .  Multiple Vitamin (MULTIVITAMIN) tablet, Take 1 tablet by mouth daily. .  nitrofurantoin, macrocrystal-monohydrate, (MACROBID) 100 MG capsule, Take 1 capsule (100 mg total) by mouth 2 (two) times daily. .   Olopatadine HCl 0.2 % SOLN, Apply 1 drop to eye daily. Marland Kitchen  ROCKLATAN 0.02-0.005 % SOLN,  .  TART CHERRY PO, Take 1 tablet by mouth daily.  Marland Kitchen  VITAMIN D PO, Take by mouth.   Reviewed prior external information including notes and imaging from  primary care provider As well as notes that were available from care everywhere and other healthcare systems.  Past medical history, social, surgical and family history all reviewed in electronic medical record.  No pertanent information unless stated regarding to the chief complaint.   Review of Systems:  No headache, visual changes, nausea, vomiting, diarrhea, constipation, dizziness, abdominal pain, skin rash, fevers, chills, night sweats, weight loss, swollen lymph nodes, body aches, joint swelling, chest pain, shortness of breath, mood changes. POSITIVE muscle aches  Objective  Blood pressure 110/62, pulse (!) 59, height 5\' 6"  (1.676 m), weight 161 lb (73 kg), SpO2 98 %.   General: No apparent distress alert and oriented x3 mood and affect normal, dressed appropriately.   Respiratory: Patient's speak in full sentences and does not appear short of breath  Cardiovascular: No lower extremity edema, non tender, no erythema  Gait antalgic MSK: Patient's patient does have some lateral tracking of the patella.  Patient is moderately tender over the medial joint line.  Patient on the medial aspect of the tibia and still has an area approximately 3-1/2 cm in length that is in the subcutaneous.  Still sore in this area.  Pain with deep palpation as well.  No pain in the calf though.  Significant decrease in swelling and bruising that was noted at last exam.    Impression and Recommendations:     The above documentation has been reviewed and is accurate and complete Lyndal Pulley, DO

## 2020-11-29 DIAGNOSIS — M5440 Lumbago with sciatica, unspecified side: Secondary | ICD-10-CM | POA: Diagnosis not present

## 2020-12-05 DIAGNOSIS — M5136 Other intervertebral disc degeneration, lumbar region: Secondary | ICD-10-CM | POA: Diagnosis not present

## 2020-12-05 DIAGNOSIS — M47816 Spondylosis without myelopathy or radiculopathy, lumbar region: Secondary | ICD-10-CM | POA: Diagnosis not present

## 2020-12-05 DIAGNOSIS — M5440 Lumbago with sciatica, unspecified side: Secondary | ICD-10-CM | POA: Diagnosis not present

## 2020-12-17 ENCOUNTER — Ambulatory Visit (INDEPENDENT_AMBULATORY_CARE_PROVIDER_SITE_OTHER): Payer: Medicare Other | Admitting: Internal Medicine

## 2020-12-17 ENCOUNTER — Encounter: Payer: Self-pay | Admitting: Internal Medicine

## 2020-12-17 ENCOUNTER — Other Ambulatory Visit: Payer: Self-pay

## 2020-12-17 VITALS — BP 118/76 | HR 60 | Temp 98.3°F | Resp 18 | Ht 66.0 in | Wt 163.8 lb

## 2020-12-17 DIAGNOSIS — I10 Essential (primary) hypertension: Secondary | ICD-10-CM

## 2020-12-17 DIAGNOSIS — M5136 Other intervertebral disc degeneration, lumbar region: Secondary | ICD-10-CM

## 2020-12-17 MED ORDER — FUROSEMIDE 20 MG PO TABS: ORAL_TABLET | ORAL | 3 refills | Status: DC

## 2020-12-17 MED ORDER — AMLODIPINE BESYLATE 10 MG PO TABS: 10.0000 mg | ORAL_TABLET | Freq: Every day | ORAL | 3 refills | Status: DC

## 2020-12-17 NOTE — Patient Instructions (Signed)
We do not need blood work today and have sent in the refills and given you the jury duty note.

## 2020-12-17 NOTE — Assessment & Plan Note (Signed)
Note done for jury duty request to excuse. She does have pain with prolonged sitting or standing and would struggle with jury duty.

## 2020-12-17 NOTE — Progress Notes (Signed)
   Subjective:   Patient ID: Jennifer Huang, female    DOB: [birth year], 70 y.o.   MRN: [id number]  HPI The patient is a 70 YO female coming in for concerns about ongoing back pain. She is having some numbness in the legs and has also knee problems. She is getting another injection in the low back next week. She has been requested to do jury duty but is unable to due to pain in the back and knees with prolonged sitting. She also has her left knee locks up with prolonged sitting.   Review of Systems  Constitutional: Positive for activity change. Negative for appetite change, chills, fatigue, fever and unexpected weight change.  Respiratory: Negative.   Cardiovascular: Negative.   Gastrointestinal: Negative.   Musculoskeletal: Positive for arthralgias, back pain and myalgias. Negative for gait problem and joint swelling.  Skin: Negative.   Neurological: Negative.     Objective:  Physical Exam Constitutional:      Appearance: She is well-developed and well-nourished.  HENT:     Head: Normocephalic and atraumatic.  Eyes:     Extraocular Movements: EOM normal.  Cardiovascular:     Rate and Rhythm: Normal rate and regular rhythm.  Pulmonary:     Effort: Pulmonary effort is normal. No respiratory distress.     Breath sounds: Normal breath sounds. No wheezing or rales.  Abdominal:     General: Bowel sounds are normal. There is no distension.     Palpations: Abdomen is soft.     Tenderness: There is no abdominal tenderness. There is no rebound.  Musculoskeletal:        General: Tenderness present. No edema.     Cervical back: Normal range of motion.  Skin:    General: Skin is warm and dry.  Neurological:     Mental Status: She is alert and oriented to person, place, and time.     Coordination: Coordination normal.  Psychiatric:        Mood and Affect: Mood and affect normal.     Vitals:   12/17/20 1533  BP: 118/76  Pulse: 60  Resp: 18  Temp: 98.3 F (36.8 C)  TempSrc:  Oral  SpO2: 98%  Weight: 163 lb 12.8 oz (74.3 kg)  Height: 5\' 6"  (1.676 m)    This visit occurred during the SARS-CoV-2 public health emergency.  Safety protocols were in place, including screening questions prior to the visit, additional usage of staff PPE, and extensive cleaning of exam room while observing appropriate contact time as indicated for disinfecting solutions.   Assessment & Plan:  Visit time 15 minutes in face to face communication with patient and coordination of care, additional 5 minutes spent in record review, coordination or care, ordering tests, communicating/referring to other healthcare professionals, documenting in medical records all on the same day of the visit for total time 20 minutes spent on the visit.

## 2020-12-17 NOTE — Assessment & Plan Note (Signed)
BP at goal refill of lasix and amlodipine done at visit today.

## 2020-12-22 DIAGNOSIS — M5440 Lumbago with sciatica, unspecified side: Secondary | ICD-10-CM | POA: Diagnosis not present

## 2020-12-23 ENCOUNTER — Telehealth: Payer: Self-pay | Admitting: Internal Medicine

## 2020-12-23 DIAGNOSIS — I10 Essential (primary) hypertension: Secondary | ICD-10-CM

## 2020-12-23 MED ORDER — FUROSEMIDE 20 MG PO TABS: ORAL_TABLET | ORAL | 3 refills | Status: DC

## 2020-12-23 MED ORDER — AMLODIPINE BESYLATE 10 MG PO TABS: 10.0000 mg | ORAL_TABLET | Freq: Every day | ORAL | 3 refills | Status: DC

## 2020-12-23 NOTE — Telephone Encounter (Signed)
1.Medication Requested:amLODipine (NORVASC) 10 MG tablet  furosemide (LASIX) 20 MG tablet     2. Pharmacy (Name, Street, [place]): [facility] [address] [place]  3. On Med List: yes   4. Last Visit with PCP: 2.25.22  5. Next visit date with PCP: n/a    Patient said she changed pharmacies from [facility] to [facility]. She is requesting the above medications be sent to [facility]. Please advise    Agent: Please be advised that RX refills may take up to 3 business days. We ask that you follow-up with your pharmacy.

## 2020-12-23 NOTE — Telephone Encounter (Signed)
Medication has been sent to the patient's pharmacy.  

## 2020-12-23 NOTE — Telephone Encounter (Signed)
[facility] called and had a question in regards to furosemide (LASIX) 20 MG tablet. They can be reached at [place]. Please advise

## 2021-01-10 ENCOUNTER — Ambulatory Visit: Payer: Medicare Other | Admitting: Family Medicine

## 2021-01-19 ENCOUNTER — Telehealth: Payer: Medicare Other

## 2021-01-19 NOTE — Progress Notes (Deleted)
Chronic Care Management Pharmacy Note  01/19/2021 Name:  TAYGAN CONNELL MRN:  [id number] DOB:  01-12-1951  Subjective: Jennifer Huang is an 70 y.o. year old female who is a primary patient of Hoyt Koch, MD.  The CCM team was consulted for assistance with disease management and care coordination needs.    {CCMTELEPHONEFACETOFACE:21091510} for {CCMINITIALFOLLOWUPCHOICE:21091511} in response to provider referral for pharmacy case management and/or care coordination services.   Consent to Services:  {CCMCONSENTOPTIONS:25074}  Patient Care Team: Hoyt Koch, MD as PCP - General (Internal Medicine) Ubaldo Glassing, Marny Lowenstein, MD (Inactive) (Obstetrics and Gynecology) Marily Memos, MD (Orthopedic Surgery) Marylynn Pearson, MD (Ophthalmology) Mosetta Anis, MD (Allergy) Rulon Sera, MD (Rehabilitation) Golden Circle, MD as Referring Physician (Ophthalmology) Leta Baptist, MD as Consulting Physician (Otolaryngology) Golden Circle, MD as Referring Physician (Ophthalmology) Charlton Haws, [facility] as Pharmacist (Pharmacist)  Recent office visits: 12/17/20 Dr Sharlet Salina OV: back pain, jury duty excuse. Refilled BP meds.  10/21/20 Dr Sharlet Salina VV: dysuria, rx'd Macrobid.  09/28/20 Dr Sharlet Salina OV: CPE, rechecked T4 and it was normal. No med changes.  Recent consult visits: 11/29/20 Dr Oleta Mouse [facility] phys med): f/u back pain, s/p epidural 10/20/20. Trial lidocaine patch, mobic, back brace.  11/11/20 Dr Tamala Julian (sports med): f/u leg/knee pain. S/p injection 07/2020. Encouraged activity, heat/massage, elevate leg, consider PT or MRI if worsens.  Hospital visits: {Hospital DC Yes/No:25215}  Objective:  Lab Results  Component Value Date   CREATININE 0.87 05/31/2020   BUN 20 05/31/2020   GFR 95.99 07/25/2018   GFRNONAA 51 (L) 09/04/2019   GFRAA 59 (L) 09/04/2019   NA 140 05/31/2020   K 4.0 05/31/2020   CALCIUM 10.2 05/31/2020   CO2 28 05/31/2020   GLUCOSE 90  05/31/2020    Lab Results  Component Value Date/Time   HGBA1C 6.2 07/25/2018 10:39 AM   HGBA1C 6.3 01/22/2018 01:54 PM   GFR 95.99 07/25/2018 10:39 AM   GFR 96.36 04/19/2017 03:05 PM    Last diabetic Eye exam: No results found for: HMDIABEYEEXA  Last diabetic Foot exam: No results found for: HMDIABFOOTEX   Lab Results  Component Value Date   CHOL 245 (H) 05/31/2020   HDL 74 05/31/2020   LDLCALC 143 (H) 05/31/2020   LDLDIRECT 129.6 10/17/2012   TRIG 152 (H) 05/31/2020   CHOLHDL 3.3 05/31/2020    Hepatic Function Latest Ref Rng & Units 05/31/2020 07/25/2018 04/19/2017  Total Protein 6.1 - 8.1 g/dL 6.8 7.4 7.1  Albumin 3.5 - 5.2 g/dL - 4.5 4.7  AST 10 - 35 U/L '17 16 16  ' ALT 6 - 29 U/L '13 13 12  ' Alk Phosphatase 39 - 117 U/L - 60 61  Total Bilirubin 0.2 - 1.2 mg/dL 0.6 0.8 0.7  Bilirubin, Direct 0.0 - 0.3 mg/dL - - 0.1    Lab Results  Component Value Date/Time   TSH 8.44 (H) 05/31/2020 02:40 PM   TSH 1.31 01/22/2018 01:54 PM   FREET4 0.90 09/28/2020 01:56 PM   FREET4 0.74 03/17/2014 12:14 PM    CBC Latest Ref Rng & Units 05/31/2020 07/25/2018 04/19/2017  WBC 3.8 - 10.8 Thousand/uL 6.4 7.3 7.6  Hemoglobin 11.7 - 15.5 g/dL 11.9 12.6 12.2  Hematocrit 35.0 - 45.0 % 36.8 38.1 36.3  Platelets 140 - 400 Thousand/uL 261 283.0 281.0    Lab Results  Component Value Date/Time   VD25OH 30 05/31/2020 02:40 PM   VD25OH 23.87 (L) 01/22/2018 01:54 PM    Clinical ASCVD: No  The 10-year ASCVD risk score Mikey Bussing DC Brooke Bonito., et al., 2013) is: 16.4%   Values used to calculate the score:     Age: 47 years     Sex: Female     Is Non-Hispanic African American: Yes     Diabetic: No     Tobacco smoker: No     Systolic Blood Pressure: 501 mmHg     Is BP treated: Yes     HDL Cholesterol: 74 mg/dL     Total Cholesterol: 245 mg/dL    Depression screen [facility] 2/9 09/28/2020 07/28/2019 07/25/2018  Decreased Interest 0 0 0  Down, Depressed, Hopeless 0 0 1  PHQ - 2 Score 0 0 1  Altered sleeping - - 1   Tired, decreased energy - - 1  Change in appetite - - 0  Feeling bad or failure about yourself  - - 0  Trouble concentrating - - 0  Moving slowly or fidgety/restless - - 0  Suicidal thoughts - - 0  PHQ-9 Score - - 3  Difficult doing work/chores - - Not difficult at all     ***Other: (CHADS2VASc if Afib, MMRC or CAT for COPD, ACT, DEXA)  Social History   Tobacco Use  Smoking Status Never Smoker  Smokeless Tobacco Never Used   BP Readings from Last 3 Encounters:  12/17/20 118/76  11/11/20 110/62  09/28/20 130/60   Pulse Readings from Last 3 Encounters:  12/17/20 60  11/11/20 (!) 59  09/28/20 (!) 54   Wt Readings from Last 3 Encounters:  12/17/20 163 lb 12.8 oz (74.3 kg)  11/11/20 161 lb (73 kg)  09/28/20 161 lb (73 kg)   BMI Readings from Last 3 Encounters:  12/17/20 26.44 kg/m  11/11/20 25.99 kg/m  09/28/20 25.99 kg/m    Assessment/Interventions: Review of patient past medical history, allergies, medications, health status, including review of consultants reports, laboratory and other test data, was performed as part of comprehensive evaluation and provision of chronic care management services.   SDOH:  (Social Determinants of Health) assessments and interventions performed: {yes/no:20286}  SDOH Screenings   Alcohol Screen: Low Risk   . Last Alcohol Screening Score (AUDIT): 0  Depression (PHQ2-9): Low Risk   . PHQ-2 Score: 0  Financial Resource Strain: Low Risk   . Difficulty of Paying Living Expenses: Not hard at all  Food Insecurity: No Food Insecurity  . Worried About Charity fundraiser in the Last Year: Never true  . Ran Out of Food in the Last Year: Never true  Housing: Low Risk   . Last Housing Risk Score: 0  Physical Activity: Sufficiently Active  . Days of Exercise per Week: 5 days  . Minutes of Exercise per Session: 30 min  Social Connections: Moderately Integrated  . Frequency of Communication with Friends and Family: More than three times a  week  . Frequency of Social Gatherings with Friends and Family: Once a week  . Attends Religious Services: More than 4 times per year  . Active Member of Clubs or Organizations: Yes  . Attends Archivist Meetings: More than 4 times per year  . Marital Status: Widowed  Stress: No Stress Concern Present  . Feeling of Stress : Not at all  Tobacco Use: Low Risk   . Smoking Tobacco Use: Never Smoker  . Smokeless Tobacco Use: Never Used  Transportation Needs: No Transportation Needs  . Lack of Transportation (Medical): No  . Lack of Transportation (Non-Medical): No    CCM  Care Plan  Allergies  Allergen Reactions  . Penicillins     REACTION: rash/hives  . Prednisone     Other reaction(s): Elevates Eye Pressure  . Sulfonamide Derivatives     REACTION: rash/hives    Medications Reviewed Today    Reviewed by Hoyt Koch, MD (Physician) on 12/17/20 at 1544  Med List Status: <None>  Medication Order Taking? Sig Documenting Provider Last Dose Status Informant  amLODipine (NORVASC) 10 MG tablet [id number]  Take 1 tablet (10 mg total) by mouth daily. Hoyt Koch, MD  Active   Ascorbic Acid (VITAMIN C PO) [id number] Yes Take by mouth. [provider] Taking Active   aspirin EC 81 MG tablet [id number] Yes Take 81 mg by mouth daily. Swallow whole.  [provider] Taking Active   Azelastine HCl 0.15 % SOLN [id number] Yes Place 2 sprays into the nose every 12 (twelve) hours as needed. Hoyt Koch, MD Taking Active   brimonidine (ALPHAGAN P) 0.1 % SOLN [id number] Yes 3 (three) times daily. [provider] Taking Active   CALCIUM PO [id number] Yes Take by mouth. [provider] Taking Active   colchicine 0.6 MG tablet [id number] Yes TAKE 1 TABLET (0.6 MG TOTAL) BY MOUTH DAILY AS NEEDED (GOUT PAIN). Gregor Hams, MD Taking Active   cyclobenzaprine (FLEXERIL) 5 MG tablet [id number] Yes Take 1 tablet (5 mg total) by mouth 3 (three)  times daily as needed for muscle spasms. Hoyt Koch, MD Taking Active   diclofenac Sodium (VOLTAREN) 1 % GEL [id number] Yes Apply 2 g topically 4 (four) times daily as needed.  [provider] Taking Active   dorzolamide-timolol (COSOPT) 22.3-6.8 MG/ML ophthalmic solution [id number] Yes 1 drop 2 (two) times daily. [provider] Taking Active   EPINEPHrine 0.3 mg/0.3 mL IJ SOAJ injection [id number] Yes Inject 0.3 mg into the muscle as needed for anaphylaxis. GENERIC TWINJECT PREFERRED  [provider] Taking Active   fexofenadine (ALLEGRA) 180 MG tablet [id number] Yes Take 180 mg by mouth daily. [provider] Taking Active   fluticasone (FLONASE) 50 MCG/ACT nasal spray [id number] Yes Place 2 sprays into both nostrils daily. [provider] Taking Active   furosemide (LASIX) 20 MG tablet [id number]  TAKE 1 TABLET BY MOUTH  DAILY IF NEEDED Hoyt Koch, MD  Active   levothyroxine (SYNTHROID) 50 MCG tablet [id number] Yes Take 50 mcg by mouth daily. [provider] Taking Active   lidocaine (LIDODERM) 5 % [id number] Yes Place 1 patch onto the skin daily. Remove & Discard patch within 12 hours or as directed by MD Hoyt Koch, MD Taking Active   montelukast (SINGULAIR) 10 MG tablet [id number] Yes Take 10 mg by mouth every evening.  [provider] Taking Active   Multiple Vitamin (MULTIVITAMIN) tablet [id number] Yes Take 1 tablet by mouth daily. [provider] Taking Active   nitrofurantoin, macrocrystal-monohydrate, (MACROBID) 100 MG capsule [id number] Yes Take 1 capsule (100 mg total) by mouth 2 (two) times daily. Hoyt Koch, MD Taking Active   Olopatadine HCl 0.2 % SOLN [id number] Yes Apply 1 drop to eye daily. [provider] Taking Active   ROCKLATAN 0.02-0.005 % SOLN [id number] Yes  [provider] Taking Active   TART CHERRY PO [id number] Yes Take 1 tablet by mouth daily.  [provider] Taking Active   traMADol (ULTRAM) 50 MG tablet [id number] Yes Take 1 tablet (50 mg total) by mouth every 8 (eight) hours as needed for  severe pain. Gregor Hams, MD Taking Active   vitamin B-12 (CYANOCOBALAMIN) 100 MCG tablet [id number] Yes Take 100 mcg by mouth daily. [provider] Taking Active   VITAMIN D PO [id number] Yes Take by mouth. [provider] Taking Active           Patient Active Problem List   Diagnosis Date Noted  . Dysuria 10/21/2020  . Contusion of left leg 09/28/2020  . Myalgia 06/01/2020  . S/P partial thyroidectomy 09/08/2019  . 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 Degenerative arthritis of left knee 05/29/2019  . Atypical mole 01/22/2018  . DDD (degenerative disc disease), lumbar 01/10/2017  . 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 Goiter 03/17/2014  . Routine health maintenance 10/19/2012  . Elevated LDL cholesterol level 10/17/2012  . Gout, unspecified 09/22/2008  . Unspecified glaucoma 09/22/2008  . Essential hypertension 07/12/2007  . OSTEOARTHROSIS, LOCAL, PRIMARY, LOWER LEG 07/12/2007    Immunization History  Administered Date(s) Administered  . Fluad Quad(high Dose 65+) 07/28/2019, 07/05/2020  . Influenza Whole 11/09/2009  . Influenza, High Dose Seasonal PF 08/31/2016, 07/24/2017, 07/25/2018  . Influenza, Seasonal, Injecte, Preservative Fre 10/17/2012  . Influenza,inj,Quad PF,6+ Mos 07/10/2014  . Moderna Sars-Covid-2 Vaccination 12/05/2019, 01/05/2020, 08/17/2020  . Pneumococcal Conjugate-13 07/24/2017  . Pneumococcal Polysaccharide-23 03/28/2016  . Tetanus 10/17/2012    Conditions to be addressed/monitored:  {USCCMDZASSESSMENTOPTIONS:23563}  There are no care plans that you recently modified to display for this patient.    Medication Assistance: {MEDASSISTANCEINFO:25044}  Patient's preferred pharmacy is:  [id number] [place] [address]Phone: [phone]: [phone] [facility]Phone: [phone]: [phone] Uses pill box? {Yes or If no, why not?:20788} Pt endorses ***% compliance  We discussed: {Pharmacy options:24294} Patient decided to: {US Pharmacy Plan:23885}  Care Plan and Follow Up Patient Decision:  {FOLLOWUP:24991}  Plan: {CM FOLLOW UP PLAN:25073}  ***    Current Barriers:  . {pharmacybarriers:24917} . ***  Pharmacist Clinical Goal(s):  .Marland KitchenPatient will {PHARMACYGOALCHOICES:24921} through collaboration with PharmD and provider.  . ***  Interventions: . 1:1 collaboration with CHoyt Koch MD regarding development and update of comprehensive plan of care as evidenced by provider attestation and co-signature . Inter-disciplinary care team collaboration (see longitudinal plan of care) . Comprehensive medication review performed; medication list updated in electronic medical record    Heart Failure / HTN   HF Type: Diastolic (Grade 1) Last ejection fraction: 60-65% (05/01/2017)  BP goal is:  <130/80 Patient checks BP at home infrequently Patient home BP readings are ranging: n/a  Patient has failed these meds in past: n/a Patient is currently controlled on the following medications:   Amlodipine 10 mg daily  Furosemide 20 mg daily PRN  We discussed: BP goals; benefits of medications; pt takes furosemide typically 1-2 times per week as needed for LE swelling  Plan: Continue current medications   Hyperlipidemia   LDL goal < 130 Patient has failed these meds in past: n/a Patient is currently uncontrolled on the following medications:   No medications  We discussed:  diet and exercise extensively; Cholesterol goals; importance of diet and exercise   Plan: Continue current  medications  Hypothyroidism   Management per endocrine  Patient has failed these meds in past: n/a Patient is currently controlled on the following  medications:   Levothyroxine 50 mcg daily  We discussed:  Pt has not picked up new rx yet; counseled to take levothyroxine first thing in AM, on empty stomach, at least 30 min before eating or other medications  Plan: Continue current medications  Gout   Patient has failed these meds in past: n/a Patient is currently controlled on the following medications:   Colchicine 0.6 mg PRN (not taking)  We discussed:  Pt had first gout flare recently, improved with colchicine; discussed low purine diet  Plan: Continue current medications; low purine diet  Chronic pain   Lumbar, cervical spondylosis Radiculopathy of cervical region Osteoarthritis  Follows with [place] [facility]  Patient has failed these meds in past: cyclobenzaprine Patient is currently controlled on the following medications:   Lidocaine patch q12h PRN?  Meloxicam 15 mg daily - LF 11/4  Pennsaid?  Voltaren gel?  Tart cherry  Cyclobenzaprine 5 mg TID prn?  Tramadol 50 mg q8h PRN - LF 11/12  We discussed:  Patient is satisfied with current regimen and is awaiting results of scans from [facility] for further treatment strategy  Plan: Continue current medications  Allergic rhinitis   Follows with Dr Donneta Romberg (asthma/allergy)  Patient has failed these meds in past: n/a Patient is currently controlled on the following medications:   Azelastine 0.15% nasal spray PRN  Fluticasone nasal spray  Fexofenadine 180 mg daily AM  Montelukast 10 mg HS  Olopatadine 0.2% eye drops  We discussed:  Patient is satisfied with current regimen and denies issues. 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 Per insurance formulary the generic Epi-Pen is Tier 3 $47 (or ~$100 in donut  hole).   Plan: Continue current medications  Glaucoma   Mangement per Dr Jimmye Norman  Patient has failed these meds in past: n/a Patient is currently controlled on the following medications:   Brimonidine (Alphagan) 0.1% eye drops TID  Dorzolamide-timolol (Cosopt) eye drops BID  Rocklatan (netarsudil-latanoprost) eye drops  We discussed:  Pt reports Rocklatan is very expensive; per formulary latanoprost is the preferred product however patient reports she was switched to Rocklatan due to inability to control IOP with previous meds including latanoprost. Rocklatan does not have PAP available.  Plan: Continue current medications   Patient Goals/Self-Care Activities . Patient will:  - {pharmacypatientgoals:24919}  Follow Up Plan: {CM FOLLOW UP DIXV:85501}

## 2021-01-24 NOTE — Progress Notes (Signed)
I, [name], LAT, ATC, am serving as scribe for Dr. [name].  Jennifer Huang is a 70 y.o. female who presents to [place] at [facility] today for f/u of L knee and leg pain.  She was last seen by Dr. Tamala Julian on 11/11/20 and was recommended to con't heat and massage to the leg to treat a contusion of her distal lower leg.  Since her last visit w/ Dr. Tamala Julian, pt reports increased pain in L knee after sitting or laying down. Pt locates pain to the anterior aspect of knee.  She notes difficulty with knee extension notes that when she walks she is able to extend her knee and has to walk with a flexed knee which is now causing some ankle pain.  Knee mechanical symptoms: no Knee swelling: slight- by the end of the day   Diagnostic testing: L tib/fib and L ankle XR- 07/05/20; L knee XR- 06/15/20  Pertinent review of systems: No fevers or chills  Relevant historical information: Hypertension   Exam:  BP 110/76 (BP Location: Left Arm, Patient Position: Sitting, Cuff Size: Normal)   Pulse 65   Ht 5\' 6"  (1.676 m)   Wt 161 lb 12.8 oz (73.4 kg)   SpO2 98%   BMI 26.12 kg/m  General: Well Developed, well nourished, and in no acute distress.   MSK: Left knee normal-appearing Range of motion lacks extension by 5 degrees.  Significant crepitations on extension. Nontender. Intact strength. Stable ligamentous exam. Negative McMurray's test.    Lab and Radiology Results  Procedure: Real-time Ultrasound Guided Injection of left knee superior lateral patellar space Device: Philips Affiniti 50G Images permanently stored and available for review in PACS Verbal informed consent obtained.  Discussed risks and benefits of procedure. Warned about infection bleeding damage to structures skin hypopigmentation and fat atrophy among others. Patient expresses understanding and agreement Time-out conducted.   Noted no overlying erythema, induration, or other signs of local  infection.   Skin prepped in a sterile fashion.   Local anesthesia: Topical Ethyl chloride.   With sterile technique and under real time ultrasound guidance:  40 mg of Kenalog and 2 mL of Marcaine injected into knee joint. Fluid seen entering the joint capsule.   Completed without difficulty   Pain immediately resolved suggesting accurate placement of the medication.   Advised to call if fevers/chills, erythema, induration, drainage, or persistent bleeding.   Images permanently stored and available for review in the ultrasound unit.  Impression: Technically successful ultrasound guided injection.     EXAM: LEFT KNEE - COMPLETE 4+ VIEW  COMPARISON:  None.  FINDINGS: Normal alignment. Normal joint spaces. Peripheral spurring in the medial tibiofemoral and patellofemoral compartments. No fracture, erosion, bony destruction or evidence of focal lesion. Small quadriceps and patellar tendon enthesophytes. Small amount of air in the suprapatellar joint space may be related to recent intervention. Small knee joint effusion. Soft tissues otherwise unremarkable.  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 Small knee joint effusion. Small amount of air in the suprapatellar joint space may be related to recent intervention.   Electronically Signed   By: Keith Rake M.D.   On: 06/15/2020 21:05  I, [name], personally (independently) visualized and performed the interpretation of the images attached in this note.      Assessment and Plan: 70 y.o. 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  Patient's exam is worse than  x-ray in August 2021 when indicated.  Plan for steroid injection and referral to physical therapy.  I think if she has improvement in her quad strength and range of motion she will have less pain in a more functional gain.  Additionally recommend Voltaren gel.  Recheck in 6  weeks.   PDMP not reviewed this encounter. Orders Placed This Encounter  Procedures  . [place] LIMITED JOINT SPACE STRUCTURES LOW LEFT(NO LINKED CHARGES)    Standing Status:   Future    Number of Occurrences:   1    Standing Expiration Date:   07/27/2021    Order Specific Question:   Reason for Exam (SYMPTOM  OR DIAGNOSIS REQUIRED)    Answer:   chronic left knee pain    Order Specific Question:   Preferred imaging location?    Answer:   [place]  . Ambulatory referral to Physical Therapy    Referral Priority:   Routine    Referral Type:   Physical Medicine    Referral Reason:   Specialty Services Required    Requested Specialty:   Physical Therapy   No orders of the defined types were placed in this encounter.    Discussed warning signs or symptoms. Please see discharge instructions. Patient expresses understanding.   The above documentation has been reviewed and is accurate and complete [name], M.D.

## 2021-01-25 ENCOUNTER — Ambulatory Visit: Payer: Medicare Other | Admitting: Family Medicine

## 2021-01-25 ENCOUNTER — Ambulatory Visit: Payer: Self-pay

## 2021-01-25 ENCOUNTER — Other Ambulatory Visit: Payer: Self-pay

## 2021-01-25 VITALS — BP 110/76 | HR 65 | Ht 66.0 in | Wt 161.8 lb

## 2021-01-25 DIAGNOSIS — M1712 Unilateral primary osteoarthritis, left knee: Secondary | ICD-10-CM

## 2021-01-25 NOTE — Patient Instructions (Signed)
Thank you for coming in today.  Call or go to the ER if you develop a large red swollen joint with extreme pain or oozing puss.   I've referred you to Physical Therapy.  Let us know if you don't hear from them in one week.  Please use voltaren gel up to 4x daily for pain as needed.   Recheck in about 6 weeks.   Let me know sooner if this is not working.

## 2021-02-07 DIAGNOSIS — M5440 Lumbago with sciatica, unspecified side: Secondary | ICD-10-CM | POA: Diagnosis not present

## 2021-02-15 ENCOUNTER — Ambulatory Visit: Payer: Medicare Other | Attending: Family Medicine

## 2021-02-15 ENCOUNTER — Other Ambulatory Visit: Payer: Self-pay

## 2021-02-15 DIAGNOSIS — R2689 Other abnormalities of gait and mobility: Secondary | ICD-10-CM | POA: Diagnosis not present

## 2021-02-15 DIAGNOSIS — M6281 Muscle weakness (generalized): Secondary | ICD-10-CM | POA: Diagnosis not present

## 2021-02-15 DIAGNOSIS — R252 Cramp and spasm: Secondary | ICD-10-CM | POA: Diagnosis not present

## 2021-02-15 DIAGNOSIS — G8929 Other chronic pain: Secondary | ICD-10-CM | POA: Diagnosis not present

## 2021-02-15 DIAGNOSIS — M5440 Lumbago with sciatica, unspecified side: Secondary | ICD-10-CM

## 2021-02-15 DIAGNOSIS — M25661 Stiffness of right knee, not elsewhere classified: Secondary | ICD-10-CM | POA: Insufficient documentation

## 2021-02-15 DIAGNOSIS — M25562 Pain in left knee: Secondary | ICD-10-CM | POA: Insufficient documentation

## 2021-02-15 DIAGNOSIS — M25662 Stiffness of left knee, not elsewhere classified: Secondary | ICD-10-CM | POA: Insufficient documentation

## 2021-02-15 NOTE — Therapy (Signed)
[facility]. [place], [place], [address] Phone: [phone]   Fax:  [phone]  Physical Therapy Evaluation  Patient Details  Name: Jennifer Huang MRN: [id number] Date of Birth: 09/10/1951 Referring Provider (PT): Liane Comber   Encounter Date: 02/15/2021   PT End of Session - 02/15/21 1202    Visit Number 1    Number of Visits 17    Date for PT Re-Evaluation 04/12/21    PT Start Time 1105    PT Stop Time 1148    PT Time Calculation (min) 43 min    Activity Tolerance Patient tolerated treatment well;Patient limited by pain    Behavior During Therapy [facility] for tasks assessed/performed           Past Medical History:  Diagnosis Date  . Allergies   . Allergy    year around seasonal allergies  . Colon polyps    anal polyp  . Diabetes mellitus without complication ([place])    pre DM-dieto controlled at this time;  . Gout, unspecified   . Hyperlipidemia    on meds  . Osteoporosis    per Dr. Sharlet Salina with proof from bone density scan   . Primary localized osteoarthrosis, lower leg   . Thyroid disease 08/2019   RIGHT thyroid removed  . Thyroid mass    right  . Unspecified essential hypertension    on meds  . Unspecified glaucoma(365.9)    Left Eye    Past Surgical History:  Procedure Laterality Date  . COLON SURGERY     anal polyp excised gen'l anesthesia  . COLONOSCOPY  10-14-2010   jacobs-hx TA 2006  . COLONOSCOPY  2016   jacobs-TA x 2 -40yr recall-miralax good  . ENUCLEATION     OD 2nd to Glaucoma  . OOPHORECTOMY     Had Blockage  . POLYPECTOMY  2016   TA x 2  . SALPINGECTOMY     Had Blockage  . THYROIDECTOMY Right 09/08/2019   Procedure: RIGHT HEMI-THYROIDECTOMY;  Surgeon: Leta Baptist, MD;  Location: [facility];  Service: ENT;  Laterality: Right;  . THYROIDECTOMY, PARTIAL Right   . TOTAL ABDOMINAL HYSTERECTOMY  2002  . WISDOM TOOTH EXTRACTION      There were no vitals filed  for this visit.    Subjective Assessment - 02/15/21 1108    Subjective Sees Dr Oleta Mouse at [facility] for LBP. Has had 2 injections in back 1x in december 2021, 1x in march. LBP with sciatica into right leg to ankle (N/T and shooting) that has been on and off for a few yrs since 2018. alot of hypersensitivty at right shin. Left knee pain (saw Dr Georgina Snell for this) that has been going on for a while. Thinks its arthritis, less range, and also is starting to get left ankle pain. (throbbing)    Pertinent History hx of fracture of left ankle, HTN, had thyroid surgery 2020, hx of right tibia fracture in 90's/early 2000s and reports loss of some inches on that leg - has not been able to get new shoe lift orthotic, R prosthetic eye    Limitations Sitting;Lifting;Standing;Walking;House hold activities    Patient Stated Goals to be able to do as much as I possibly can, go out to do flowers, go up and down stairs, be able to move better with less pain, improve balance.    Currently in Pain? Yes    Pain Score 3     Pain Location  Leg    Pain Orientation Right    Pain Descriptors / Indicators Tingling    Pain Radiating Towards right lateral shin    Multiple Pain Sites Yes    Pain Score 2    Pain Location Knee    Pain Orientation Left    Pain Descriptors / Indicators Dull              [facility] PT Assessment - 02/15/21 0001      Assessment   Medical Diagnosis Lumbago with sciatica (R>L), left knee OA    Referring Provider (PT) Dr Oleta Mouse and Dr Georgina Snell   Hand Dominance Right      Precautions   Required Braces or Orthoses --   uses a stiff knee immobilizer/brace for the left knee as needed     Balance Screen   Has the patient fallen in the past 6 months No    How many times? last year in the yard    Has the patient had a decrease in activity level because of a fear of falling?  Yes    Is the patient reluctant to leave their home because of a fear of falling?  No   "try to bem ore cautious in what I do" "When I turn  around i get off balance"     Home Environment   Additional Comments Lives alone. trilevel house. steps x 3 in front to enter. 2 sets of stairs, depends on HR to help.      Prior Function   Level of Independence --   has a cane/walker at home but will only use in leg pain is very bad   Vocation Retired    Leisure gardening, going out with friends, church      Cognition   Overall Cognitive Status Within Functional Limits for tasks assessed      Observation/Other Assessments   Focus on Therapeutic Outcomes (FOTO)  51%      ROM / Strength   AROM / PROM / Strength AROM;Strength      AROM   AROM Assessment Site Lumbar ROM grossly limited 50%   Right/Left Knee Right;Left    Left Knee Extension -12   -10 passive. Patellar crepetus   Left Knee Flexion 115   pulling pain at knee   Lumbar Flexion 110   pulling pain   Lumbar Extension -5   "tight"     Strength   Overall Strength Comments B hips, knees 4/5 flex/ext      Flexibility   Soft Tissue Assessment /Muscle Length yes    Hamstrings tight B    Quadriceps tight B    Piriformis tight B (R>L)      Palpation   Palpation comment TTP at the lumbar paraspinals R>L, right piriformis and glutes.      Ambulation/Gait   Gait Pattern Antalgic;Decreased trunk rotation;Decreased stance time - left;Decreased stance time - right;Decreased arm swing - right;Decreased arm swing - left;Wide base of support    Ambulation Surface Level;Indoor    Gait velocity TUG: 10 seconds, antalgic, some lateral trunk lean compensaitons    Stairs Yes    Stairs Assistance 6: Modified independent (Device/Increase time)    Stair Management Technique Two rails   alternating to ascend, step to with LLE leading to descend.   Height of Stairs --   4 & 6 inch     Balance   Balance Assessed Yes      Standardized Balance Assessment   Standardized Balance Assessment Five  Times Sit to Stand    Five times sit to stand comments  28 seconds without UE assist, B knee  tightness/discomfort                      Objective measurements completed on examination: See above findings.               PT Education - 02/15/21 1202    Education Details initial PT POC. HEP. Access Code: [email or web address]    Person(s) Educated Patient    Methods Explanation;Handout;Demonstration    Comprehension Verbalized understanding;Returned demonstration;Need further instruction;Verbal cues required;Tactile cues required            PT Short Term Goals - 02/15/21 1221      PT SHORT TERM GOAL #1   Title Pt will demonstrate proper recall of HEP    Time 2    Period Weeks    Status New    Target Date 03/01/21             PT Long Term Goals - 02/15/21 1222      PT LONG TERM GOAL #1   Title Pt will demo B lumbar ROM to [facility] and </= 2/10 pain to facilitate functional mobility with ADLs    Time 8    Period Weeks    Status New    Target Date 04/12/21      PT LONG TERM GOAL #2   Title FOTO to at least 65%    Time 8    Period Weeks    Status New    Target Date 04/12/21      PT LONG TERM GOAL #3   Title Pt will achieve 5TSTS in < 13 seconds to demonstrate improved functional strength and balance    Time 8    Period Weeks    Status New    Target Date 04/12/21      PT LONG TERM GOAL #4   Title Pt will report improved ease and </= 1/10 knee and back pain with stair climbing using safe reciprocal pattern    Time 8    Period Weeks    Status New    Target Date 04/12/21                  Plan - 02/15/21 1203    Clinical Impression Statement Pt is a 70 yo female who presents to physical therapy eval for c/o low back pain with radiating sciatic symptoms (N/T) into the right leg (referral provided by Dr Oleta Mouse) and left knee pain (referral by Dr Georgina Snell). Pt currently presents with BLE and core mms weakness, decreased and painful knee and trunk ROM, mms tightness, and left knee crepitus. She also has history of leg length discrepancy after a past leg  fracture and reports needing a new orthotic, has not been using one. As a reuslt of these impairments pt has had difficulty with lifting, walking, stairs at home, and has noticed balance is more challenging. She demosntrates decreased functional strength and balance as per her score on the 5TSTS test at this time. She will benefit from skilled physical therapy to work on decreasing pain and improving funcitonal strength balance and mobility.    Personal Factors and Comorbidities Comorbidity 3+    Examination-Activity Limitations Locomotion Level;Lift;Stand;Stairs;Squat;Bend    Stability/Clinical Decision Making Evolving/Moderate complexity    Clinical Decision Making Moderate    Rehab Potential Good    PT Frequency 2x / week   (1 to  2 x/wk with plan to modify after frst 4 weeks based on progress)   PT Duration 8 weeks    PT Treatment/Interventions ADLs/Self Care Home Management;Cryotherapy;Electrical Stimulation;Iontophoresis 4mg /ml Dexamethasone;Moist Heat;Stair training;Gait training;Functional mobility training;Therapeutic activities;Therapeutic exercise;Balance training;Neuromuscular re-education;Manual techniques;Patient/family education;Taping;Vasopneumatic Device    PT Next Visit Plan Reassess HEP. Knee ROM and strengthening as tolerated. LE and trunk flexibility. Core stab. Gait and balance as tolerated. Manual and modalities as needed.    PT Home Exercise Plan see pt edu    Consulted and Agree with Plan of Care Patient           Patient will benefit from skilled therapeutic intervention in order to improve the following deficits and impairments:  Abnormal gait,Decreased range of motion,Difficulty walking,Increased muscle spasms,Decreased activity tolerance,Pain,Decreased mobility,Decreased strength,Decreased balance,Impaired flexibility  Visit Diagnosis: Muscle weakness (generalized) - Plan: PT plan of care cert/re-cert  Cramp and spasm - Plan: PT plan of care cert/re-cert  Acute low  back pain with sciatica, sciatica laterality unspecified, unspecified back pain laterality - Plan: PT plan of care cert/re-cert  Stiffness of right knee, not elsewhere classified - Plan: PT plan of care cert/re-cert  Chronic pain of left knee - Plan: PT plan of care cert/re-cert  Stiffness of left knee, not elsewhere classified - Plan: PT plan of care cert/re-cert  Other abnormalities of gait and mobility - Plan: PT plan of care cert/re-cert     Problem List Patient Active Problem List   Diagnosis Date Noted  . Dysuria 10/21/2020  . Myalgia 06/01/2020  . S/P partial thyroidectomy 09/08/2019  . 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 Degenerative arthritis of left knee 05/29/2019  . Atypical mole 01/22/2018  . DDD (degenerative disc disease), lumbar 01/10/2017  . 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 Goiter 03/17/2014  . Routine health maintenance 10/19/2012  . Elevated LDL cholesterol level 10/17/2012  . Gout, unspecified 09/22/2008  . Unspecified glaucoma 09/22/2008  . Essential hypertension 07/12/2007    Hall Busing, PT, DPT 02/15/2021, 12:32 PM  [place]. [place], [place], [address] Phone: [phone]   Fax:  [phone]  Name: Jennifer Huang MRN: [id number] Date of Birth: [birth year]

## 2021-02-15 NOTE — Patient Instructions (Signed)
Access Code: [email or web address] URL: [email or web address] Date: 02/15/2021 Prepared by: Sherlynn Stalls  Exercises Seated March - 1 x daily - 7 x weekly - 2 sets - 10 reps Seated Long Arc Quad - 1 x daily - 7 x weekly - 2 sets - 10 reps - 3 seconds hold Seated Heel Toe Raises - 1 x daily - 7 x weekly - 2 sets - 10 reps Sit to Stand with Counter Support - 1 x daily - 7 x weekly - 2 sets - 10 reps Supine Lower Trunk Rotation - 1 x daily - 7 x weekly - 2 sets - 10 reps Supine Posterior Pelvic Tilt - 1 x daily - 7 x weekly - 2 sets - 10 reps

## 2021-02-22 ENCOUNTER — Other Ambulatory Visit: Payer: Self-pay

## 2021-02-22 ENCOUNTER — Ambulatory Visit: Payer: Medicare Other | Attending: Family Medicine | Admitting: Rehabilitative and Restorative Service Providers"

## 2021-02-22 ENCOUNTER — Encounter: Payer: Self-pay | Admitting: Rehabilitative and Restorative Service Providers"

## 2021-02-22 DIAGNOSIS — M25662 Stiffness of left knee, not elsewhere classified: Secondary | ICD-10-CM | POA: Diagnosis not present

## 2021-02-22 DIAGNOSIS — M5440 Lumbago with sciatica, unspecified side: Secondary | ICD-10-CM

## 2021-02-22 DIAGNOSIS — G8929 Other chronic pain: Secondary | ICD-10-CM

## 2021-02-22 DIAGNOSIS — M6281 Muscle weakness (generalized): Secondary | ICD-10-CM | POA: Diagnosis not present

## 2021-02-22 DIAGNOSIS — R252 Cramp and spasm: Secondary | ICD-10-CM | POA: Insufficient documentation

## 2021-02-22 DIAGNOSIS — R2689 Other abnormalities of gait and mobility: Secondary | ICD-10-CM | POA: Diagnosis not present

## 2021-02-22 DIAGNOSIS — M25661 Stiffness of right knee, not elsewhere classified: Secondary | ICD-10-CM

## 2021-02-22 DIAGNOSIS — M25562 Pain in left knee: Secondary | ICD-10-CM | POA: Diagnosis not present

## 2021-02-22 NOTE — Therapy (Signed)
[facility]. [place], [place], [address] Phone: [phone]   Fax:  [phone]  Physical Therapy Treatment  Patient Details  Name: Jennifer Huang MRN: [id number] Date of Birth: 08-12-1951 Referring Provider (PT): Liane Comber   Encounter Date: 02/22/2021   PT End of Session - 02/22/21 1244    Visit Number 2    Number of Visits 17    Date for PT Re-Evaluation 04/12/21    PT Start Time 5631    PT Stop Time 1315    PT Time Calculation (min) 42 min    Activity Tolerance Patient tolerated treatment well;Patient limited by pain    Behavior During Therapy [facility] for tasks assessed/performed           Past Medical History:  Diagnosis Date  . Allergies   . Allergy    year around seasonal allergies  . Colon polyps    anal polyp  . Diabetes mellitus without complication ([place])    pre DM-dieto controlled at this time;  . Gout, unspecified   . Hyperlipidemia    on meds  . Osteoporosis    per Dr. Sharlet Salina with proof from bone density scan   . Primary localized osteoarthrosis, lower leg   . Thyroid disease 08/2019   RIGHT thyroid removed  . Thyroid mass    right  . Unspecified essential hypertension    on meds  . Unspecified glaucoma(365.9)    Left Eye    Past Surgical History:  Procedure Laterality Date  . COLON SURGERY     anal polyp excised gen'l anesthesia  . COLONOSCOPY  10-14-2010   jacobs-hx TA 2006  . COLONOSCOPY  2016   jacobs-TA x 2 -46yr recall-miralax good  . ENUCLEATION     OD 2nd to Glaucoma  . OOPHORECTOMY     Had Blockage  . POLYPECTOMY  2016   TA x 2  . SALPINGECTOMY     Had Blockage  . THYROIDECTOMY Right 09/08/2019   Procedure: RIGHT HEMI-THYROIDECTOMY;  Surgeon: Leta Baptist, MD;  Location: [facility];  Service: ENT;  Laterality: Right;  . THYROIDECTOMY, PARTIAL Right   . TOTAL ABDOMINAL HYSTERECTOMY  2002  . WISDOM TOOTH EXTRACTION      There were no vitals filed for  this visit.   Subjective Assessment - 02/22/21 1240    Subjective My knee and ankle hurt today, I think my ankle hurts because of my knee.  I am not sure that I will be able to come twice a week for too long due to the $40 copay, but I want to get started here first and decide.    Patient Stated Goals to be able to do as much as I possibly can, go out to do flowers, go up and down stairs, be able to move better with less pain, improve balance.    Currently in Pain? Yes    Pain Score 3     Pain Location Knee    Pain Orientation Right    Pain Descriptors / Indicators Tingling    Pain Type Chronic pain    Pain Score 4    Pain Location Knee    Pain Orientation Left    Pain Descriptors / Indicators Dull                             [facility] Adult PT Treatment/Exercise - 02/22/21 0001  Exercises   Exercises Knee/Hip      Knee/Hip Exercises: Aerobic   Nustep L4, 8 min      Knee/Hip Exercises: Standing   Lateral Step Up Both;1 set;10 reps;Hand Hold: 1;Step Height: 4"    Forward Step Up Both;1 set;10 reps;Hand Hold: 1;Step Height: 4"      Knee/Hip Exercises: Seated   Long Arc Quad Both;2 sets    Long Arc Quad Weight 2 lbs.    Marching Both;2 sets;10 reps    Marching Weights 2 lbs.    Sit to [employer] 10 reps      Knee/Hip Exercises: Supine   Bridges 2 sets;10 reps    Straight Leg Raises Both;2 sets;10 reps   2#                   PT Short Term Goals - 02/15/21 1221      PT SHORT TERM GOAL #1   Title Pt will demonstrate proper recall of HEP    Time 2    Period Weeks    Status New    Target Date 03/01/21             PT Long Term Goals - 02/15/21 1222      PT LONG TERM GOAL #1   Title Pt will demo B lumbar ROM to [facility] and </= 2/10 pain to facilitate functional mobility with ADLs    Time 8    Period Weeks    Status New    Target Date 04/12/21      PT LONG TERM GOAL #2   Title FOTO to at least 65%    Time 8    Period Weeks    Status New     Target Date 04/12/21      PT LONG TERM GOAL #3   Title Pt will achieve 5TSTS in < 13 seconds to demonstrate improved functional strength and balance    Time 8    Period Weeks    Status New    Target Date 04/12/21      PT LONG TERM GOAL #4   Title Pt will report improved ease and </= 1/10 knee and back pain with stair climbing using safe reciprocal pattern    Time 8    Period Weeks    Status New    Target Date 04/12/21                 Plan - 02/22/21 1325    Clinical Impression Statement Pt tolerated session well and did not report any increase in pain.  Focused session with activities and exercises that patient could perform at her home, as she is hoping to wean down on PT visits after a couple of weeks to perform HEP more.  Patient with fatigue reported, but able to continue with session.  At completion of session, pt able to ambulate 2 laps around gym loop with no loss of balance, but had antalgic gait pattern.  Pt continues to require skilled PT to progress with safety and independence.    PT Treatment/Interventions ADLs/Self Care Home Management;Cryotherapy;Electrical Stimulation;Iontophoresis 4mg /ml Dexamethasone;Moist Heat;Stair training;Gait training;Functional mobility training;Therapeutic activities;Therapeutic exercise;Balance training;Neuromuscular re-education;Manual techniques;Patient/family education;Taping;Vasopneumatic Device    PT Next Visit Plan Knee ROM and strengthening as tolerated. LE and trunk flexibility. Core stab. Gait and balance as tolerated. Manual and modalities as needed.    Consulted and Agree with Plan of Care Patient           Patient will benefit from  skilled therapeutic intervention in order to improve the following deficits and impairments:  Abnormal gait,Decreased range of motion,Difficulty walking,Increased muscle spasms,Decreased activity tolerance,Pain,Decreased mobility,Decreased strength,Decreased balance,Impaired flexibility  Visit  Diagnosis: Stiffness of left knee, not elsewhere classified  Stiffness of right knee, not elsewhere classified  Chronic pain of left knee  Other abnormalities of gait and mobility  Acute low back pain with sciatica, sciatica laterality unspecified, unspecified back pain laterality  Muscle weakness (generalized)     Problem List Patient Active Problem List   Diagnosis Date Noted  . Dysuria 10/21/2020  . Myalgia 06/01/2020  . S/P partial thyroidectomy 09/08/2019  . 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 Degenerative arthritis of left knee 05/29/2019  . Atypical mole 01/22/2018  . DDD (degenerative disc disease), lumbar 01/10/2017  . 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 Goiter 03/17/2014  . Routine health maintenance 10/19/2012  . Elevated LDL cholesterol level 10/17/2012  . Gout, unspecified 09/22/2008  . Unspecified glaucoma 09/22/2008  . Essential hypertension 07/12/2007    Juel Burrow, PT, DPT 02/22/2021, 1:34 PM  [place]. [place], [place], [address] Phone: [phone]   Fax:  [phone]  Name: Jennifer Huang MRN: [id number] Date of Birth: Jun 26, 1951

## 2021-02-24 ENCOUNTER — Other Ambulatory Visit: Payer: Self-pay

## 2021-02-24 ENCOUNTER — Encounter: Payer: Self-pay | Admitting: Physical Therapy

## 2021-02-24 ENCOUNTER — Ambulatory Visit: Payer: Medicare Other | Admitting: Physical Therapy

## 2021-02-24 DIAGNOSIS — G8929 Other chronic pain: Secondary | ICD-10-CM | POA: Diagnosis not present

## 2021-02-24 DIAGNOSIS — M5440 Lumbago with sciatica, unspecified side: Secondary | ICD-10-CM | POA: Diagnosis not present

## 2021-02-24 DIAGNOSIS — M25662 Stiffness of left knee, not elsewhere classified: Secondary | ICD-10-CM | POA: Diagnosis not present

## 2021-02-24 DIAGNOSIS — M25562 Pain in left knee: Secondary | ICD-10-CM

## 2021-02-24 DIAGNOSIS — M25661 Stiffness of right knee, not elsewhere classified: Secondary | ICD-10-CM | POA: Diagnosis not present

## 2021-02-24 DIAGNOSIS — M6281 Muscle weakness (generalized): Secondary | ICD-10-CM | POA: Diagnosis not present

## 2021-02-24 DIAGNOSIS — R252 Cramp and spasm: Secondary | ICD-10-CM | POA: Diagnosis not present

## 2021-02-24 DIAGNOSIS — R2689 Other abnormalities of gait and mobility: Secondary | ICD-10-CM | POA: Diagnosis not present

## 2021-02-24 NOTE — Therapy (Signed)
[place]. [place], [place], [address] Phone: [phone]   Fax:  [phone]  Physical Therapy Treatment  Patient Details  Name: Jennifer Huang MRN: [id number] Date of Birth: 11-06-1950 Referring Provider (PT): Liane Comber   Encounter Date: 02/24/2021   PT End of Session - 02/24/21 1642    Visit Number 3    Number of Visits 17    Date for PT Re-Evaluation 04/12/21    PT Start Time 1600    PT Stop Time 0998    PT Time Calculation (min) 43 min    Activity Tolerance Patient tolerated treatment well    Behavior During Therapy [facility] for tasks assessed/performed           Past Medical History:  Diagnosis Date  . Allergies   . Allergy    year around seasonal allergies  . Colon polyps    anal polyp  . Diabetes mellitus without complication ([place])    pre DM-dieto controlled at this time;  . Gout, unspecified   . Hyperlipidemia    on meds  . Osteoporosis    per Dr. Sharlet Salina with proof from bone density scan   . Primary localized osteoarthrosis, lower leg   . Thyroid disease 08/2019   RIGHT thyroid removed  . Thyroid mass    right  . Unspecified essential hypertension    on meds  . Unspecified glaucoma(365.9)    Left Eye    Past Surgical History:  Procedure Laterality Date  . COLON SURGERY     anal polyp excised gen'l anesthesia  . COLONOSCOPY  10-14-2010   jacobs-hx TA 2006  . COLONOSCOPY  2016   jacobs-TA x 2 -64yr recall-miralax good  . ENUCLEATION     OD 2nd to Glaucoma  . OOPHORECTOMY     Had Blockage  . POLYPECTOMY  2016   TA x 2  . SALPINGECTOMY     Had Blockage  . THYROIDECTOMY Right 09/08/2019   Procedure: RIGHT HEMI-THYROIDECTOMY;  Surgeon: Leta Baptist, MD;  Location: [place];  Service: ENT;  Laterality: Right;  . THYROIDECTOMY, PARTIAL Right   . TOTAL ABDOMINAL HYSTERECTOMY  2002  . WISDOM TOOTH EXTRACTION      There were no vitals filed for this visit.    Subjective Assessment - 02/24/21 1603    Subjective Thinks she over did it this morning mowing the grass.    Currently in Pain? No/denies    Pain Location Knee    Pain Orientation Right    Pain Descriptors / Indicators Sore;Tightness                             [facility] Adult PT Treatment/Exercise - 02/24/21 0001      Knee/Hip Exercises: Aerobic   Recumbent Bike L2 x 4 min    Nustep L4  6 min      Knee/Hip Exercises: Standing   Forward Step Up Both;1 set;5 reps;Hand Hold: 0;Step Height: 6"    Walking with Sports Cord 30lb 4 way x 3 each    Other Standing Knee Exercises 2lb marchine x10      Knee/Hip Exercises: Seated   Long Arc Quad Both;2 sets;10 reps    Long Arc Quad Weight 2 lbs.    Hamstring Curl Strengthening;2 sets;10 reps    Hamstring Limitations Red Tband    Sit to Sand 2 sets  PT Short Term Goals - 02/15/21 1221      PT SHORT TERM GOAL #1   Title Pt will demonstrate proper recall of HEP    Time 2    Period Weeks    Status New    Target Date 03/01/21             PT Long Term Goals - 02/15/21 1222      PT LONG TERM GOAL #1   Title Pt will demo B lumbar ROM to [facility] and </= 2/10 pain to facilitate functional mobility with ADLs    Time 8    Period Weeks    Status New    Target Date 04/12/21      PT LONG TERM GOAL #2   Title FOTO to at least 65%    Time 8    Period Weeks    Status New    Target Date 04/12/21      PT LONG TERM GOAL #3   Title Pt will achieve 5TSTS in < 13 seconds to demonstrate improved functional strength and balance    Time 8    Period Weeks    Status New    Target Date 04/12/21      PT LONG TERM GOAL #4   Title Pt will report improved ease and </= 1/10 knee and back pain with stair climbing using safe reciprocal pattern    Time 8    Period Weeks    Status New    Target Date 04/12/21                 Plan - 02/24/21 1642    Clinical Impression Statement Pt did well overall  today, advanced to some resisted gait, cues needed to control the eccentric phase. Decrease TKE noted with step ups and LAQ using LLE. Increase fatigue noted with sit to stands. Hips tend to externally rotate on recumbent bike, can not correct with cues. .    Personal Factors and Comorbidities Comorbidity 3+    Examination-Activity Limitations Locomotion Level;Lift;Stand;Stairs;Squat;Bend    Stability/Clinical Decision Making Evolving/Moderate complexity    Rehab Potential Good    PT Frequency 2x / week    PT Duration 8 weeks    PT Treatment/Interventions ADLs/Self Care Home Management;Cryotherapy;Electrical Stimulation;Iontophoresis 4mg /ml Dexamethasone;Moist Heat;Stair training;Gait training;Functional mobility training;Therapeutic activities;Therapeutic exercise;Balance training;Neuromuscular re-education;Manual techniques;Patient/family education;Taping;Vasopneumatic Device    PT Next Visit Plan Knee ROM and strengthening as tolerated. LE and trunk flexibility. Core stab. Gait and balance as tolerated. Manual and modalities as needed.           Patient will benefit from skilled therapeutic intervention in order to improve the following deficits and impairments:  Abnormal gait,Decreased range of motion,Difficulty walking,Increased muscle spasms,Decreased activity tolerance,Pain,Decreased mobility,Decreased strength,Decreased balance,Impaired flexibility  Visit Diagnosis: Stiffness of left knee, not elsewhere classified  Stiffness of right knee, not elsewhere classified  Chronic pain of left knee     Problem List Patient Active Problem List   Diagnosis Date Noted  . Dysuria 10/21/2020  . Myalgia 06/01/2020  . S/P partial thyroidectomy 09/08/2019  . 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 Degenerative arthritis of left knee 05/29/2019  . Atypical mole 01/22/2018  . DDD (degenerative disc disease), lumbar 01/10/2017  . Arthritis of left acromioclavicular joint 11/07/2016   . Goiter 03/17/2014  . Routine health maintenance 10/19/2012  . Elevated LDL cholesterol level 10/17/2012  . Gout, unspecified 09/22/2008  . Unspecified glaucoma 09/22/2008  . Essential hypertension 07/12/2007  Scot Jun 02/24/2021, 4:45 PM  [place]. [place], [place], [address] Phone: [phone]   Fax:  [phone]  Name: SAVVY PEETERS MRN: [id number] Date of Birth: 01-20-1951

## 2021-03-01 ENCOUNTER — Ambulatory Visit: Payer: Medicare Other

## 2021-03-03 ENCOUNTER — Encounter: Payer: Self-pay | Admitting: Rehabilitative and Restorative Service Providers"

## 2021-03-03 ENCOUNTER — Other Ambulatory Visit: Payer: Self-pay

## 2021-03-03 ENCOUNTER — Ambulatory Visit: Payer: Medicare Other | Admitting: Rehabilitative and Restorative Service Providers"

## 2021-03-03 DIAGNOSIS — M25661 Stiffness of right knee, not elsewhere classified: Secondary | ICD-10-CM

## 2021-03-03 DIAGNOSIS — M25562 Pain in left knee: Secondary | ICD-10-CM | POA: Diagnosis not present

## 2021-03-03 DIAGNOSIS — G8929 Other chronic pain: Secondary | ICD-10-CM

## 2021-03-03 DIAGNOSIS — R2689 Other abnormalities of gait and mobility: Secondary | ICD-10-CM | POA: Diagnosis not present

## 2021-03-03 DIAGNOSIS — R252 Cramp and spasm: Secondary | ICD-10-CM | POA: Diagnosis not present

## 2021-03-03 DIAGNOSIS — M5440 Lumbago with sciatica, unspecified side: Secondary | ICD-10-CM | POA: Diagnosis not present

## 2021-03-03 DIAGNOSIS — M25662 Stiffness of left knee, not elsewhere classified: Secondary | ICD-10-CM | POA: Diagnosis not present

## 2021-03-03 DIAGNOSIS — M6281 Muscle weakness (generalized): Secondary | ICD-10-CM

## 2021-03-03 NOTE — Therapy (Signed)
[place]. [place], [place], [address] Phone: [phone]   Fax:  [phone]  Physical Therapy Treatment  Patient Details  Name: Jennifer Huang MRN: [id number] Date of Birth: [birth year] Referring Provider (PT): Liane Comber   Encounter Date: 03/03/2021   PT End of Session - 03/03/21 1158    Visit Number 4    Number of Visits 17    Date for PT Re-Evaluation 04/12/21    PT Start Time 1100    PT Stop Time 1144    PT Time Calculation (min) 44 min    Activity Tolerance Patient tolerated treatment well    Behavior During Therapy [facility] for tasks assessed/performed           Past Medical History:  Diagnosis Date  . Allergies   . Allergy    year around seasonal allergies  . Colon polyps    anal polyp  . Diabetes mellitus without complication ([place])    pre DM-dieto controlled at this time;  . Gout, unspecified   . Hyperlipidemia    on meds  . Osteoporosis    per Dr. Sharlet Salina with proof from bone density scan   . Primary localized osteoarthrosis, lower leg   . Thyroid disease 08/2019   RIGHT thyroid removed  . Thyroid mass    right  . Unspecified essential hypertension    on meds  . Unspecified glaucoma(365.9)    Left Eye    Past Surgical History:  Procedure Laterality Date  . COLON SURGERY     anal polyp excised gen'l anesthesia  . COLONOSCOPY  10-14-2010   jacobs-hx TA 2006  . COLONOSCOPY  2016   jacobs-TA x 2 -19yr recall-miralax good  . ENUCLEATION     OD 2nd to Glaucoma  . OOPHORECTOMY     Had Blockage  . POLYPECTOMY  2016   TA x 2  . SALPINGECTOMY     Had Blockage  . THYROIDECTOMY Right 09/08/2019   Procedure: RIGHT HEMI-THYROIDECTOMY;  Surgeon: Leta Baptist, MD;  Location: [place];  Service: ENT;  Laterality: Right;  . THYROIDECTOMY, PARTIAL Right   . TOTAL ABDOMINAL HYSTERECTOMY  2002  . WISDOM TOOTH EXTRACTION      There were no vitals filed for this visit.    Subjective Assessment - 03/03/21 1110    Subjective My knee is really bothering me today.    Currently in Pain? Yes    Pain Score 3     Pain Location Knee    Pain Orientation Left    Pain Descriptors / Indicators Aching;Throbbing    Pain Score 2    Pain Location Knee    Pain Orientation Right    Pain Descriptors / Indicators Aching;Cramping                             [facility] Adult PT Treatment/Exercise - 03/03/21 0001      Knee/Hip Exercises: Aerobic   Recumbent Bike L2 x 4 min    Nustep L5  6 min      Knee/Hip Exercises: Standing   Forward Step Up Both;1 set;5 reps;Step Height: 6";Hand Hold: 1   with opposite knee going up high   Walking with Sports Cord 30lb 4 way x 3 each    Other Standing Knee Exercises 2lb marching 2x10 bilat.      Knee/Hip Exercises: Seated   Long Arc Quad Both;2 sets;10 reps  Long Arc Quad Weight 2 lbs.    Hamstring Curl Strengthening;2 sets;10 reps    Hamstring Limitations Red Tband    Sit to [employer] 10 reps                    PT Short Term Goals - 03/03/21 1203      PT SHORT TERM GOAL #1   Title Pt will demonstrate proper recall of HEP    Status On-going             PT Long Term Goals - 03/03/21 1203      PT LONG TERM GOAL #1   Title Pt will demo B lumbar ROM to [facility] and </= 2/10 pain to facilitate functional mobility with ADLs    Baseline --   Pt without back pain during PT session.   Status On-going      PT LONG TERM GOAL #2   Status On-going      PT LONG TERM GOAL #3   Status On-going      PT LONG TERM GOAL #4   Status On-going                 Plan - 03/03/21 1159    Clinical Impression Statement Pt continues to have decreased TKE with step up exercise, but was able to maintain safe resisted gait.  Pt requires cuing for technique during exercise and encouragement to locate her ankle weights at home to assist with her HEP.  Pt with knee instability noted today, especially after NuStep,  requiring SBA for safety.  She continues to require skilled PT to progress towards goal related activities.    Personal Factors and Comorbidities Comorbidity 3+    Examination-Activity Limitations Locomotion Level;Lift;Stand;Stairs;Squat;Bend    PT Treatment/Interventions ADLs/Self Care Home Management;Cryotherapy;Electrical Stimulation;Iontophoresis 4mg /ml Dexamethasone;Moist Heat;Stair training;Gait training;Functional mobility training;Therapeutic activities;Therapeutic exercise;Balance training;Neuromuscular re-education;Manual techniques;Patient/family education;Taping;Vasopneumatic Device    PT Next Visit Plan Knee ROM and strengthening as tolerated. LE and trunk flexibility. Core stab. Gait and balance as tolerated. Manual and modalities as needed.    Consulted and Agree with Plan of Care Patient           Patient will benefit from skilled therapeutic intervention in order to improve the following deficits and impairments:  Abnormal gait,Decreased range of motion,Difficulty walking,Increased muscle spasms,Decreased activity tolerance,Pain,Decreased mobility,Decreased strength,Decreased balance,Impaired flexibility  Visit Diagnosis: Stiffness of left knee, not elsewhere classified  Stiffness of right knee, not elsewhere classified  Chronic pain of left knee  Other abnormalities of gait and mobility  Acute low back pain with sciatica, sciatica laterality unspecified, unspecified back pain laterality  Muscle weakness (generalized)     Problem List Patient Active Problem List   Diagnosis Date Noted  . Dysuria 10/21/2020  . Myalgia 06/01/2020  . S/P partial thyroidectomy 09/08/2019  . 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 Degenerative arthritis of left knee 05/29/2019  . Atypical mole 01/22/2018  . DDD (degenerative disc disease), lumbar 01/10/2017  . 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 Goiter 03/17/2014  . Routine health maintenance 10/19/2012   . Elevated LDL cholesterol level 10/17/2012  . Gout, unspecified 09/22/2008  . Unspecified glaucoma 09/22/2008  . Essential hypertension 07/12/2007    Jennifer Huang, PT, DPT 03/03/2021, 12:05 PM  [place]. [place], [place], [address] Phone: [phone]   Fax:  [phone]  Name: Jennifer Huang MRN: [id number] Date of Birth: [birth year]

## 2021-03-07 DIAGNOSIS — H35372 Puckering of macula, left eye: Secondary | ICD-10-CM | POA: Diagnosis not present

## 2021-03-07 DIAGNOSIS — H2512 Age-related nuclear cataract, left eye: Secondary | ICD-10-CM | POA: Diagnosis not present

## 2021-03-07 DIAGNOSIS — H401122 Primary open-angle glaucoma, left eye, moderate stage: Secondary | ICD-10-CM | POA: Diagnosis not present

## 2021-03-08 ENCOUNTER — Ambulatory Visit: Payer: Medicare Other | Admitting: Family Medicine

## 2021-03-08 ENCOUNTER — Ambulatory Visit: Payer: Medicare Other | Admitting: Rehabilitative and Restorative Service Providers"

## 2021-03-08 DIAGNOSIS — E039 Hypothyroidism, unspecified: Secondary | ICD-10-CM | POA: Diagnosis not present

## 2021-03-10 ENCOUNTER — Ambulatory Visit: Payer: Medicare Other | Admitting: Rehabilitative and Restorative Service Providers"

## 2021-03-14 DIAGNOSIS — E063 Autoimmune thyroiditis: Secondary | ICD-10-CM | POA: Diagnosis not present

## 2021-03-14 DIAGNOSIS — Z9009 Acquired absence of other part of head and neck: Secondary | ICD-10-CM | POA: Diagnosis not present

## 2021-03-14 DIAGNOSIS — Z8639 Personal history of other endocrine, nutritional and metabolic disease: Secondary | ICD-10-CM | POA: Diagnosis not present

## 2021-03-14 DIAGNOSIS — E039 Hypothyroidism, unspecified: Secondary | ICD-10-CM | POA: Diagnosis not present

## 2021-03-15 ENCOUNTER — Encounter: Payer: Medicare Other | Admitting: Rehabilitative and Restorative Service Providers"

## 2021-03-17 ENCOUNTER — Ambulatory Visit: Payer: Medicare Other | Admitting: Rehabilitative and Restorative Service Providers"

## 2021-03-18 ENCOUNTER — Other Ambulatory Visit: Payer: Self-pay

## 2021-03-18 ENCOUNTER — Encounter: Payer: Self-pay | Admitting: Physical Therapy

## 2021-03-18 ENCOUNTER — Ambulatory Visit: Payer: Medicare Other | Admitting: Physical Therapy

## 2021-03-18 DIAGNOSIS — R2689 Other abnormalities of gait and mobility: Secondary | ICD-10-CM

## 2021-03-18 DIAGNOSIS — M5440 Lumbago with sciatica, unspecified side: Secondary | ICD-10-CM | POA: Diagnosis not present

## 2021-03-18 DIAGNOSIS — M25661 Stiffness of right knee, not elsewhere classified: Secondary | ICD-10-CM | POA: Diagnosis not present

## 2021-03-18 DIAGNOSIS — G8929 Other chronic pain: Secondary | ICD-10-CM | POA: Diagnosis not present

## 2021-03-18 DIAGNOSIS — R252 Cramp and spasm: Secondary | ICD-10-CM | POA: Diagnosis not present

## 2021-03-18 DIAGNOSIS — M25562 Pain in left knee: Secondary | ICD-10-CM

## 2021-03-18 DIAGNOSIS — M25662 Stiffness of left knee, not elsewhere classified: Secondary | ICD-10-CM | POA: Diagnosis not present

## 2021-03-18 DIAGNOSIS — M6281 Muscle weakness (generalized): Secondary | ICD-10-CM | POA: Diagnosis not present

## 2021-03-18 NOTE — Progress Notes (Signed)
   I, Peterson Lombard, LAT, ATC acting as a scribe for [name], MD.  Jennifer Huang is a 70 y.o. female who presents to [place] at [facility] today for f/u L knee pain due to OA/PF DJD. Pt was last seen by Dr. Georgina Snell on 01/25/21 and was given a L knee steroid injection, advised to use Voltaren gel and was referred to PT of which she's completed 5 visits. Today, pt reports PT helped somewhat, but pt is still having pain. Pt c/o knee pain is causing her lower leg and ankle to hurt. Pt locates knee pain to along the anterior aspect and c/o pain/difficulty flexing knee. Pt reports relief from previous steroid injection.  Dx imaging: 07/05/20 L Tib/fib XR  06/15/20 L knee XR  Pertinent review of systems: No fevers or chills  Relevant historical information: Hypertension.  Glaucoma.  Artificial eye.   Exam:  BP 117/68 (BP Location: Right Arm, Patient Position: Sitting, Cuff Size: Normal)   Pulse 63   Ht 5\' 6"  (1.676 m)   Wt 161 lb (73 kg)   SpO2 98%   BMI 25.99 kg/m  General: Well Developed, well nourished, and in no acute distress.   MSK: Left knee normal-appearing normal motion with crepitation. Stable ligamentous exam. Left ankle normal motion normal.    Lab and Radiology Results  Images left knee obtained today personally and independently interpreted Mild degenerative changes.  No acute fractures. Await formal radiology review   EXAM: LEFT KNEE - COMPLETE 4+ VIEW  COMPARISON:  None.  FINDINGS: Normal alignment. Normal joint spaces. Peripheral spurring in the medial tibiofemoral and patellofemoral compartments. No fracture, erosion, bony destruction or evidence of focal lesion. Small quadriceps and patellar tendon enthesophytes. Small amount of air in the suprapatellar joint space may be related to recent intervention. Small knee joint effusion. Soft tissues otherwise unremarkable.  IMPRESSION: Mild osteoarthritis in the medial tibiofemoral  and patellofemoral compartments. Small knee joint effusion. Small amount of air in the suprapatellar joint space may be related to recent intervention.   Electronically Signed   By: Keith Rake M.D.   On: 06/15/2020 21:05 I, [name], personally (independently) visualized and performed the interpretation of the images attached in this note.    Assessment and Plan: 70 y.o. female with left knee pain due to DJD.  Not improving much with steroid injections.  Last steroid injection in early April lasted only about a month.  Additionally steroids are interfering with her glaucoma treatment.  Plan to proceed to hyaluronic acid injections and we will work on authorization now.  Recheck once authorized for hyaluronic acid series.   PDMP not reviewed this encounter. Orders Placed This Encounter  Procedures  . DG Knee AP/LAT W/Sunrise Left    Standing Status:   Future    Number of Occurrences:   1    Standing Expiration Date:   03/22/2022    Order Specific Question:   Reason for Exam (SYMPTOM  OR DIAGNOSIS REQUIRED)    Answer:   eval left knee    Order Specific Question:   Preferred imaging location?    Answer:   [facility]   No orders of the defined types were placed in this encounter.    Discussed warning signs or symptoms. Please see discharge instructions. Patient expresses understanding.   The above documentation has been reviewed and is accurate and complete [name], M.D.

## 2021-03-18 NOTE — Therapy (Signed)
[place]. [place], [place], [address] Phone: [phone]   Fax:  [phone]  Physical Therapy Treatment  Patient Details  Name: Jennifer Huang MRN: [id number] Date of Birth: 07-31-1951 Referring Provider (PT): Liane Comber   Encounter Date: 03/18/2021   PT End of Session - 03/18/21 1040    Visit Number 5    Number of Visits 17    Date for PT Re-Evaluation 04/12/21    PT Start Time 0925    PT Stop Time 1010    PT Time Calculation (min) 45 min    Activity Tolerance Patient tolerated treatment well    Behavior During Therapy [facility] for tasks assessed/performed           Past Medical History:  Diagnosis Date  . Allergies   . Allergy    year around seasonal allergies  . Colon polyps    anal polyp  . Diabetes mellitus without complication ([place])    pre DM-dieto controlled at this time;  . Gout, unspecified   . Hyperlipidemia    on meds  . Osteoporosis    per Dr. Sharlet Salina with proof from bone density scan   . Primary localized osteoarthrosis, lower leg   . Thyroid disease 08/2019   RIGHT thyroid removed  . Thyroid mass    right  . Unspecified essential hypertension    on meds  . Unspecified glaucoma(365.9)    Left Eye    Past Surgical History:  Procedure Laterality Date  . COLON SURGERY     anal polyp excised gen'l anesthesia  . COLONOSCOPY  10-14-2010   jacobs-hx TA 2006  . COLONOSCOPY  2016   jacobs-TA x 2 -65yr recall-miralax good  . ENUCLEATION     OD 2nd to Glaucoma  . OOPHORECTOMY     Had Blockage  . POLYPECTOMY  2016   TA x 2  . SALPINGECTOMY     Had Blockage  . THYROIDECTOMY Right 09/08/2019   Procedure: RIGHT HEMI-THYROIDECTOMY;  Surgeon: Leta Baptist, MD;  Location: [place];  Service: ENT;  Laterality: Right;  . THYROIDECTOMY, PARTIAL Right   . TOTAL ABDOMINAL HYSTERECTOMY  2002  . WISDOM TOOTH EXTRACTION      There were no vitals filed for this visit.    Subjective Assessment - 03/18/21 0934    Subjective Knee really hurting, the back is feeling better    Currently in Pain? Yes    Pain Score 5     Pain Location Knee    Pain Orientation Left    Pain Descriptors / Indicators Aching;Sore    Aggravating Factors  walking, bending                             [facility] Adult PT Treatment/Exercise - 03/18/21 0001      Knee/Hip Exercises: Aerobic   Recumbent Bike L2 x 6 min    Nustep L5  6 min      Knee/Hip Exercises: Standing   Terminal Knee Extension Left;2 sets;10 reps    Terminal Knee Extension Limitations ball behind the knee    Other Standing Knee Exercises 2lb marching and hip abduction2x10 bilat.      Knee/Hip Exercises: Seated   Long Arc Quad Both;2 sets;10 reps    Long Arc Quad Weight 2 lbs.    Other Seated Knee/Hip Exercises left ankle sit fit ankle motions    Hamstring Curl Strengthening;2 sets;10  reps    Hamstring Limitations Red Tband      Knee/Hip Exercises: Supine   Short Arc Quad Sets Left;2 sets;10 reps    Short Arc Quad Sets Limitations 2#    Other Supine Knee/Hip Exercises feet on ball K2C, trunk rotation, small bridges and isometrica abs                    PT Short Term Goals - 03/18/21 1050      PT SHORT TERM GOAL #1   Title Pt will demonstrate proper recall of HEP    Status Partially Met             PT Long Term Goals - 03/03/21 1203      PT LONG TERM GOAL #1   Title Pt will demo B lumbar ROM to [facility] and </= 2/10 pain to facilitate functional mobility with ADLs    Baseline --   Pt without back pain during PT session.   Status On-going      PT LONG TERM GOAL #2   Status On-going      PT LONG TERM GOAL #3   Status On-going      PT LONG TERM GOAL #4   Status On-going                 Plan - 03/18/21 1040    Clinical Impression Statement Patient with some difficulty with the left knee extension, she also c/o some left ankle pain, we worked on core engagement,  needed some verbal and tactile cues to get this to fire, started out very stiff but seemed to loosen up with the exercises    PT Next Visit Plan Knee ROM and strengthening as tolerated. LE and trunk flexibility. Core stab. Gait and balance as tolerated. Manual and modalities as needed.    Consulted and Agree with Plan of Care Patient           Patient will benefit from skilled therapeutic intervention in order to improve the following deficits and impairments:  Abnormal gait,Decreased range of motion,Difficulty walking,Increased muscle spasms,Decreased activity tolerance,Pain,Decreased mobility,Decreased strength,Decreased balance,Impaired flexibility  Visit Diagnosis: Stiffness of left knee, not elsewhere classified  Stiffness of right knee, not elsewhere classified  Chronic pain of left knee  Other abnormalities of gait and mobility  Acute low back pain with sciatica, sciatica laterality unspecified, unspecified back pain laterality  Muscle weakness (generalized)  Cramp and spasm     Problem List Patient Active Problem List   Diagnosis Date Noted  . Dysuria 10/21/2020  . Myalgia 06/01/2020  . S/P partial thyroidectomy 09/08/2019  . 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 Degenerative arthritis of left knee 05/29/2019  . Atypical mole 01/22/2018  . DDD (degenerative disc disease), lumbar 01/10/2017  . 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 Goiter 03/17/2014  . Routine health maintenance 10/19/2012  . Elevated LDL cholesterol level 10/17/2012  . Gout, unspecified 09/22/2008  . Unspecified glaucoma 09/22/2008  . Essential hypertension 07/12/2007    Sumner Boast., PT 03/18/2021, 10:51 AM  [place]. [place], [place], [address] Phone: [phone]   Fax:  [phone]  Name: Jennifer Huang MRN: [id number] Date of Birth: 10/31/1950

## 2021-03-22 ENCOUNTER — Other Ambulatory Visit: Payer: Self-pay

## 2021-03-22 ENCOUNTER — Ambulatory Visit (INDEPENDENT_AMBULATORY_CARE_PROVIDER_SITE_OTHER): Payer: Medicare Other

## 2021-03-22 ENCOUNTER — Ambulatory Visit: Payer: Medicare Other | Admitting: Family Medicine

## 2021-03-22 VITALS — BP 117/68 | HR 63 | Ht 66.0 in | Wt 161.0 lb

## 2021-03-22 DIAGNOSIS — G8929 Other chronic pain: Secondary | ICD-10-CM

## 2021-03-22 DIAGNOSIS — M25562 Pain in left knee: Secondary | ICD-10-CM

## 2021-03-22 DIAGNOSIS — M79605 Pain in left leg: Secondary | ICD-10-CM | POA: Diagnosis not present

## 2021-03-22 DIAGNOSIS — M1712 Unilateral primary osteoarthritis, left knee: Secondary | ICD-10-CM | POA: Diagnosis not present

## 2021-03-22 DIAGNOSIS — M25462 Effusion, left knee: Secondary | ICD-10-CM | POA: Diagnosis not present

## 2021-03-22 NOTE — Patient Instructions (Signed)
Thank you for coming in today.  Please get an Xray today before you leave  We will work on approval for the gel shot.   Return for the gel injection soon.

## 2021-03-23 ENCOUNTER — Ambulatory Visit: Payer: Medicare Other | Admitting: Family Medicine

## 2021-03-23 ENCOUNTER — Other Ambulatory Visit: Payer: Self-pay

## 2021-03-23 VITALS — BP 118/80 | HR 62 | Ht 66.0 in | Wt 161.0 lb

## 2021-03-23 DIAGNOSIS — M25562 Pain in left knee: Secondary | ICD-10-CM | POA: Diagnosis not present

## 2021-03-23 MED ORDER — TRAMADOL HCL 50 MG PO TABS: 50.0000 mg | ORAL_TABLET | Freq: Three times a day (TID) | ORAL | 0 refills | Status: DC | PRN

## 2021-03-23 NOTE — Progress Notes (Signed)
   Rito Ehrlich, am serving as a Education administrator for Dr. [name].  Jennifer Huang is a 70 y.o. female who presents to [place] at [facility] today for Left knee pain that occurred after stepping over a small gate and when she put her foot down on the otherside her knee locked up and was constantly aching after the initial sharp pain. Patient was using ice, Voltaren, and tylenol for the pain. Patient was seen 03/22/2021 for left knee pain and will be working on getting Gel injections authorized.  She denies hitting her knee on any objects.   Pertinent review of systems: No fevers or chills  Relevant historical information: Hypertension   Exam:  BP 118/80 (BP Location: Left Arm, Patient Position: Sitting, Cuff Size: Normal)   Pulse 62   Ht 5\' 6"  (1.676 m)   Wt 161 lb (73 kg)   SpO2 98%   BMI 25.99 kg/m  General: Well Developed, well nourished, and in no acute distress.   MSK: Left knee minimal effusion Normal-appearing otherwise. Normal motion with mild crepitation. Mild tender palpation medial joint line.  Not very tender overlying bony prominences. Stable ligamentous exam. Negative Murray's test. Intact strength    Lab and Radiology Results No results found for this or any previous visit (from the past 72 hour(s)). DG Knee AP/LAT W/Sunrise Left  Result Date: 03/22/2021 CLINICAL DATA:  Chronic knee pain EXAM: LEFT KNEE 3 VIEWS COMPARISON:  12/29/2015, 07/05/2020 FINDINGS: No fracture or malalignment. Mild medial and patellofemoral degenerative change with small effusion. IMPRESSION: Mild degenerative changes with small effusion. Electronically Signed   By: Donavan Foil M.D.   On: 03/22/2021 19:25   I, [name], personally (independently) visualized and performed the interpretation of the images attached in this note.     Assessment and Plan: 70 y.o. female with left knee acute exacerbation of chronic pain.  Discussed options.  Doubtful for  fracture or severe ligamentous injury.  Plan to proceed with current plan of hyaluronic acid injections.  Still working on authorization as patient was just seen yesterday.  We will schedule patient in the near future once hyaluronic acid injections are authorized.  If these should fail next step should probably be MRI.   PDMP reviewed during this encounter. No orders of the defined types were placed in this encounter.  Meds ordered this encounter  Medications  . traMADol (ULTRAM) 50 MG tablet    Sig: Take 1 tablet (50 mg total) by mouth every 8 (eight) hours as needed for severe pain.    Dispense:  15 tablet    Refill:  0     Discussed warning signs or symptoms. Please see discharge instructions. Patient expresses understanding.   The above documentation has been reviewed and is accurate and complete [name], M.D.

## 2021-03-23 NOTE — Progress Notes (Signed)
Left knee shows some mild arthritis changes

## 2021-03-23 NOTE — Patient Instructions (Signed)
Thank you for coming in today.  Use the tramadol sparingly.   We will try the gel shots.   It will probably take a week or two to get approved.   Let me know if this is not working.

## 2021-04-04 ENCOUNTER — Telehealth: Payer: Self-pay | Admitting: Family Medicine

## 2021-04-04 NOTE — Telephone Encounter (Signed)
Pt received a response from [facility] about her Synvisc1, but is confused. It initially denied but later in the same letter says that this IS their "preferred product".  Have we received anything confirming or denying?

## 2021-04-05 NOTE — Telephone Encounter (Signed)
Left msg for pt to schedule Synvisc1 per note from [facility].

## 2021-04-05 NOTE — Telephone Encounter (Signed)
Appointment scheduled.

## 2021-04-08 ENCOUNTER — Ambulatory Visit: Payer: Self-pay

## 2021-04-08 ENCOUNTER — Ambulatory Visit (INDEPENDENT_AMBULATORY_CARE_PROVIDER_SITE_OTHER): Payer: Medicare Other | Admitting: Family Medicine

## 2021-04-08 ENCOUNTER — Other Ambulatory Visit: Payer: Self-pay

## 2021-04-08 DIAGNOSIS — G8929 Other chronic pain: Secondary | ICD-10-CM | POA: Diagnosis not present

## 2021-04-08 DIAGNOSIS — M1712 Unilateral primary osteoarthritis, left knee: Secondary | ICD-10-CM | POA: Diagnosis not present

## 2021-04-08 DIAGNOSIS — M25562 Pain in left knee: Secondary | ICD-10-CM | POA: Diagnosis not present

## 2021-04-08 NOTE — Progress Notes (Signed)
Jennifer Huang presents to clinic today for Synvisc One injection left knee  Procedure: Real-time Ultrasound Guided Injection of left knee superior lateral patellar space Device: Philips Affiniti 50G Images permanently stored and available for review in PACS Verbal informed consent obtained.  Discussed risks and benefits of procedure. Warned about infection bleeding damage to structures skin hypopigmentation and fat atrophy among others. Patient expresses understanding and agreement Time-out conducted.   Noted no overlying erythema, induration, or other signs of local infection.   Skin prepped in a sterile fashion.   Local anesthesia: Topical Ethyl chloride.   With sterile technique and under real time ultrasound guidance: Synvisc One 29ml injected into left knee joint. Fluid seen entering the joint capsule.   Completed without difficulty   Advised to call if fevers/chills, erythema, induration, drainage, or persistent bleeding.   Images permanently stored and available for review in the ultrasound unit.  Impression: Technically successful ultrasound guided injection.  Lot number [id number]  Return as needed

## 2021-04-08 NOTE — Patient Instructions (Signed)
Good to see you today.  You had a L knee Synvisc injection today.  Call or go to the ER if you develop a large red swollen joint with extreme pain or oozing puss.   Follow-up as needed.

## 2021-05-13 DIAGNOSIS — D440 Neoplasm of uncertain behavior of thyroid gland: Secondary | ICD-10-CM | POA: Diagnosis not present

## 2021-05-30 ENCOUNTER — Encounter: Payer: Self-pay | Admitting: Internal Medicine

## 2021-05-30 ENCOUNTER — Ambulatory Visit (INDEPENDENT_AMBULATORY_CARE_PROVIDER_SITE_OTHER): Payer: Medicare Other | Admitting: Internal Medicine

## 2021-05-30 ENCOUNTER — Other Ambulatory Visit: Payer: Self-pay

## 2021-05-30 VITALS — BP 130/80 | HR 66 | Temp 98.7°F | Resp 18 | Ht 66.0 in | Wt 161.4 lb

## 2021-05-30 DIAGNOSIS — M5136 Other intervertebral disc degeneration, lumbar region: Secondary | ICD-10-CM | POA: Diagnosis not present

## 2021-05-30 DIAGNOSIS — Z970 Presence of artificial eye: Secondary | ICD-10-CM | POA: Diagnosis not present

## 2021-05-30 DIAGNOSIS — Z23 Encounter for immunization: Secondary | ICD-10-CM | POA: Diagnosis not present

## 2021-05-30 MED ORDER — TRAMADOL HCL 50 MG PO TABS: 50.0000 mg | ORAL_TABLET | Freq: Every day | ORAL | 0 refills | Status: DC | PRN

## 2021-05-30 NOTE — Progress Notes (Signed)
   Subjective:   Patient ID: Jennifer Huang, female    DOB: 1951/05/15, 70 y.o.   MRN: [id number]  HPI Patient is a 70 YO Female coming in for referral for ocularist to get prosthetic eye maintained. Needs polishing/cleaning to avoid infection. Also needs refill on tramadol which she uses rarely for pain when severe.   Review of Systems  Constitutional: Negative.   HENT: Negative.    Eyes: Negative.   Respiratory:  Negative for cough, chest tightness and shortness of breath.   Cardiovascular:  Negative for chest pain, palpitations and leg swelling.  Gastrointestinal:  Negative for abdominal distention, abdominal pain, constipation, diarrhea, nausea and vomiting.  Musculoskeletal:  Positive for arthralgias and myalgias.  Skin: Negative.   Neurological: Negative.   Psychiatric/Behavioral: Negative.     Objective:  Physical Exam Constitutional:      Appearance: She is well-developed.  HENT:     Head: Normocephalic and atraumatic.  Cardiovascular:     Rate and Rhythm: Normal rate and regular rhythm.  Pulmonary:     Effort: Pulmonary effort is normal. No respiratory distress.     Breath sounds: Normal breath sounds. No wheezing or rales.  Abdominal:     General: Bowel sounds are normal. There is no distension.     Palpations: Abdomen is soft.     Tenderness: There is no abdominal tenderness. There is no rebound.  Musculoskeletal:        General: Tenderness present.     Cervical back: Normal range of motion.  Skin:    General: Skin is warm and dry.  Neurological:     Mental Status: She is alert and oriented to person, place, and time.     Coordination: Coordination normal.    Vitals:   05/30/21 1318  BP: 130/80  Pulse: 66  Resp: 18  Temp: 98.7 F (37.1 C)  TempSrc: Oral  SpO2: 98%  Weight: 161 lb 6.4 oz (73.2 kg)  Height: '5\' 6"'$  (1.676 m)    This visit occurred during the SARS-CoV-2 public health emergency.  Safety protocols were in place, including screening  questions prior to the visit, additional usage of staff PPE, and extensive cleaning of exam room while observing appropriate contact time as indicated for disinfecting solutions.   Assessment & Plan:  Prevnar 20 given at visit

## 2021-05-31 NOTE — Assessment & Plan Note (Signed)
Refill tramadol which she uses rarely.

## 2021-05-31 NOTE — Assessment & Plan Note (Signed)
Needs maintenance of the prosthetic eye letter done for insurance coverage.

## 2021-06-22 ENCOUNTER — Telehealth (INDEPENDENT_AMBULATORY_CARE_PROVIDER_SITE_OTHER): Payer: Medicare Other | Admitting: Emergency Medicine

## 2021-06-22 ENCOUNTER — Encounter: Payer: Self-pay | Admitting: Emergency Medicine

## 2021-06-22 DIAGNOSIS — J019 Acute sinusitis, unspecified: Secondary | ICD-10-CM

## 2021-06-22 DIAGNOSIS — R6889 Other general symptoms and signs: Secondary | ICD-10-CM | POA: Diagnosis not present

## 2021-06-22 DIAGNOSIS — U071 COVID-19: Secondary | ICD-10-CM | POA: Diagnosis not present

## 2021-06-22 MED ORDER — AZITHROMYCIN 250 MG PO TABS: ORAL_TABLET | ORAL | 0 refills | Status: DC

## 2021-06-22 NOTE — Progress Notes (Signed)
Telemedicine Encounter- SOAP NOTE Established Patient Patient: Home  Provider: Office   Patient present only  This telephone encounter was conducted with the patient's (or proxy's) verbal consent via audio telecommunications: yes/no: Yes Patient was instructed to have this encounter in a suitably private space; and to only have persons present to whom they give permission to participate. In addition, patient identity was confirmed by use of name plus two identifiers (DOB and address).  I discussed the limitations, risks, security and privacy concerns of performing an evaluation and management service by telephone and the availability of in person appointments. I also discussed with the patient that there may be a patient responsible charge related to this service. The patient expressed understanding and agreed to proceed.  I spent a total of TIME; 0 MIN TO 60 MIN: 20 minutes talking with the patient or their proxy.  Chief complaint: Flulike symptoms  Subjective   Jennifer Huang is a 70 y.o. female established patient. Telephone visit today complaining of flulike symptoms that started on 06/03/2021.  Thought it was an allergy flareup.  Mostly complaining of sinus congestion.  Home COVID test was positive last Monday but no change in her symptoms.  Lives alone.  Denies difficulty breathing, fever or chills, nausea or vomiting, abdominal pain or diarrhea, chest pain or palpitations.  Denies significant body aches.  Mostly her sinuses are bothering her.  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   Degenerative arthritis of left knee 05/29/2019   Atypical mole 01/22/2018   DDD (degenerative disc disease), lumbar 01/10/2017   Arthritis of left acromioclavicular joint 11/07/2016   Goiter 03/17/2014   Routine health maintenance 10/19/2012    Elevated LDL cholesterol level 10/17/2012   Gout, unspecified 09/22/2008   Unspecified glaucoma 09/22/2008   Essential hypertension 07/12/2007    Past Medical History:  Diagnosis Date   Allergies    Allergy    year around seasonal allergies   Colon polyps    anal polyp   Diabetes mellitus without complication ([place])    pre DM-dieto controlled at this time;   Gout, unspecified    Hyperlipidemia    on meds   Osteoporosis    per Dr. Sharlet Salina with proof from bone density scan    Primary localized osteoarthrosis, lower leg    Thyroid disease 08/2019   RIGHT thyroid removed   Thyroid mass    right   Unspecified essential hypertension    on meds   Unspecified glaucoma(365.9)    Left Eye    Current Outpatient Medications  Medication Sig Dispense Refill   amLODipine (NORVASC) 10 MG tablet Take 1 tablet (10 mg total) by mouth daily. 90 tablet 3   Ascorbic Acid (VITAMIN C PO) Take by mouth.     aspirin EC 81 MG tablet Take 81 mg by mouth daily. Swallow whole.      Azelastine HCl 0.15 % SOLN Place 2 sprays into the nose every 12 (twelve) hours as needed. 30 mL 2   brimonidine (ALPHAGAN P) 0.1 % SOLN 3 (three) times daily.     CALCIUM PO Take by mouth.     colchicine 0.6 MG tablet TAKE 1 TABLET (0.6 MG TOTAL) BY MOUTH DAILY AS NEEDED (GOUT PAIN). 90 tablet 1   cyclobenzaprine (FLEXERIL) 5 MG tablet Take 1 tablet (5 mg total) by  mouth 3 (three) times daily as needed for muscle spasms. 30 tablet 1   diclofenac Sodium (VOLTAREN) 1 % GEL Apply 2 g topically 4 (four) times daily as needed.      dorzolamide-timolol (COSOPT) 22.3-6.8 MG/ML ophthalmic solution 1 drop 2 (two) times daily.     EPINEPHrine 0.3 mg/0.3 mL IJ SOAJ injection Inject 0.3 mg into the muscle as needed for anaphylaxis. GENERIC TWINJECT PREFERRED      fexofenadine (ALLEGRA) 180 MG tablet Take 180 mg by mouth daily.     fluticasone (FLONASE) 50 MCG/ACT nasal spray Place 2 sprays into both nostrils daily.     furosemide  (LASIX) 20 MG tablet TAKE 1 TABLET BY MOUTH  DAILY IF NEEDED 90 tablet 3   levothyroxine (SYNTHROID) 50 MCG tablet Take 50 mcg by mouth daily.     lidocaine (LIDODERM) 5 % Place 1 patch onto the skin daily. Remove & Discard patch within 12 hours or as directed by MD 30 patch 0   montelukast (SINGULAIR) 10 MG tablet Take 10 mg by mouth every evening.   0   Multiple Vitamin (MULTIVITAMIN) tablet Take 1 tablet by mouth daily.     Olopatadine HCl 0.2 % SOLN Apply 1 drop to eye daily.     ROCKLATAN 0.02-0.005 % SOLN      TART CHERRY PO Take 1 tablet by mouth daily.      traMADol (ULTRAM) 50 MG tablet Take 1 tablet (50 mg total) by mouth daily as needed for severe pain. 30 tablet 0   vitamin B-12 (CYANOCOBALAMIN) 100 MCG tablet Take 100 mcg by mouth daily.     VITAMIN D PO Take by mouth.     No current facility-administered medications for this visit.    Allergies  Allergen Reactions   Penicillins     REACTION: rash/hives   Prednisone     Other reaction(s): Elevates Eye Pressure   Sulfonamide Derivatives     REACTION: rash/hives    Social History   Socioeconomic History   Marital status: Widowed    Spouse name: Not on file   Number of children: 0   Years of education: Not on file   Highest education level: Not on file  Occupational History   Occupation: Scientist, research (physical sciences): RETIRED   Occupation: SO - self-employed  Tobacco Use   Smoking status: Never   Smokeless tobacco: Never  Vaping Use   Vaping Use: Never used  Substance and Sexual Activity   Alcohol use: No    Alcohol/week: 0.0 standard drinks   Drug use: No   Sexual activity: Not Currently    Partners: Male    Birth control/protection: Surgical  Other Topics Concern   Not on file  Social History Narrative   HSG, @ year college. Married '73. No children. On disability - orthopedic: knee, back, shortened leg.. SO - self-employed.      Dec '12 - many stressors: husband with cancer, brother with pancreatic cancer,  housing and financial issues.             Social Determinants of Health   Financial Resource Strain: Low Risk    Difficulty of Paying Living Expenses: Not hard at all  Food Insecurity: No Food Insecurity   Worried About Charity fundraiser in the Last Year: Never true   [place] in the Last Year: Never true  Transportation Needs: No Transportation Needs   Lack of Transportation (Medical): No   Lack of Transportation (  Non-Medical): No  Physical Activity: Sufficiently Active   Days of Exercise per Week: 5 days   Minutes of Exercise per Session: 30 min  Stress: No Stress Concern Present   Feeling of Stress : Not at all  Social Connections: Moderately Integrated   Frequency of Communication with Friends and Family: More than three times a week   Frequency of Social Gatherings with Friends and Family: Once a week   Attends Religious Services: More than 4 times per year   Active Member of [employer] or Organizations: Yes   Attends Archivist Meetings: More than 4 times per year   Marital Status: Widowed  Intimate Partner Violence: Not on file    Review of Systems  Constitutional: Negative.  Negative for chills and fever.  HENT:  Positive for congestion and sinus pain.   Respiratory:  Negative for cough and shortness of breath.   Cardiovascular: Negative.  Negative for chest pain and palpitations.  Gastrointestinal:  Negative for abdominal pain, blood in stool, diarrhea, melena, nausea and vomiting.  Genitourinary: Negative.  Negative for dysuria and hematuria.  Musculoskeletal: Negative.  Negative for back pain, joint pain and myalgias.  Skin: Negative.  Negative for rash.  Neurological: Negative.  Negative for dizziness and headaches.  All other systems reviewed and are negative.  Objective  Alert and oriented x3 in no apparent respiratory distress Vitals as reported by the patient: There were no vitals filed for this visit.  Diagnoses and all orders for this  visit:  COVID-19 virus infection  Flu-like symptoms  Acute non-recurrent sinusitis, unspecified location -     azithromycin (ZITHROMAX) 250 MG tablet; Sig as indicated   Clinically stable.  No red flag signs or symptoms.  Symptoms started on 06/03/2021.  Outside the window of medication effectiveness, antivirals.  Running its course.  Slowly getting better. Will treat possible secondary bacterial infection in the sinuses with Augmentin. COVID advice given.  ED advice given. Advised to contact the office if no better or worse during the next several days.  I discussed the assessment and treatment plan with the patient. The patient was provided an opportunity to ask questions and all were answered. The patient agreed with the plan and demonstrated an understanding of the instructions.   The patient was advised to call back or seek an in-person evaluation if the symptoms worsen or if the condition fails to improve as anticipated.  I provided 20 minutes of non-face-to-face time during this encounter.  Horald Pollen, MD  Primary Care at [facility]

## 2021-06-23 ENCOUNTER — Telehealth: Payer: Self-pay | Admitting: Internal Medicine

## 2021-06-23 NOTE — Telephone Encounter (Signed)
Patient had phone visit w/ Dr. Mitchel Honour 08.31.22 due to COVID+ on at home test  Provider prescribed patient Azithromycin 250 MG   Patient says she does not feel comfortable taking the medicine bc of all the side effects it has  Would like to speak to Dr. Sharlet Salina or nurse to see if anything else can be sent to pharmacy to clear up congestion w/ less symptoms  Pharmacy: [facility] [place], [place] - [address]  Phone:  [phone] Fax:  [phone]  Please call patient [phone]

## 2021-06-23 NOTE — Telephone Encounter (Signed)
I do not want Ms. Spiess to take anything she does not feel comfortable with. Recommend to take Mucinex around-the-clock which will help with her congestion.  Otherwise COVID needs to continue running its course.  Thanks.

## 2021-06-23 NOTE — Telephone Encounter (Signed)
Called and spoke with pt, advised her of Dr. Barry Brunner recommendations. She verb understanding.

## 2021-06-23 NOTE — Telephone Encounter (Signed)
See below

## 2021-06-23 NOTE — Telephone Encounter (Signed)
I would recommend this be sent to treating provider

## 2021-07-12 ENCOUNTER — Other Ambulatory Visit: Payer: Self-pay | Admitting: Internal Medicine

## 2021-07-12 DIAGNOSIS — E039 Hypothyroidism, unspecified: Secondary | ICD-10-CM

## 2021-07-12 DIAGNOSIS — E890 Postprocedural hypothyroidism: Secondary | ICD-10-CM

## 2021-07-12 DIAGNOSIS — Z8639 Personal history of other endocrine, nutritional and metabolic disease: Secondary | ICD-10-CM

## 2021-07-13 DIAGNOSIS — H2512 Age-related nuclear cataract, left eye: Secondary | ICD-10-CM | POA: Diagnosis not present

## 2021-07-13 DIAGNOSIS — H1032 Unspecified acute conjunctivitis, left eye: Secondary | ICD-10-CM | POA: Diagnosis not present

## 2021-07-13 DIAGNOSIS — H35372 Puckering of macula, left eye: Secondary | ICD-10-CM | POA: Diagnosis not present

## 2021-07-13 DIAGNOSIS — H401122 Primary open-angle glaucoma, left eye, moderate stage: Secondary | ICD-10-CM | POA: Diagnosis not present

## 2021-07-15 ENCOUNTER — Ambulatory Visit
Admission: RE | Admit: 2021-07-15 | Discharge: 2021-07-15 | Disposition: A | Discharge status: Home / Self Care | Payer: Medicare Other | Source: Ambulatory Visit | Attending: Internal Medicine | Admitting: Internal Medicine

## 2021-07-15 DIAGNOSIS — E039 Hypothyroidism, unspecified: Secondary | ICD-10-CM | POA: Diagnosis not present

## 2021-07-15 DIAGNOSIS — E890 Postprocedural hypothyroidism: Secondary | ICD-10-CM

## 2021-07-15 DIAGNOSIS — Z8639 Personal history of other endocrine, nutritional and metabolic disease: Secondary | ICD-10-CM

## 2021-07-15 DIAGNOSIS — E041 Nontoxic single thyroid nodule: Secondary | ICD-10-CM | POA: Diagnosis not present

## 2021-08-02 DIAGNOSIS — J300 Vasomotor rhinitis: Secondary | ICD-10-CM | POA: Diagnosis not present

## 2021-08-02 DIAGNOSIS — Z91030 Bee allergy status: Secondary | ICD-10-CM | POA: Diagnosis not present

## 2021-08-02 DIAGNOSIS — H1045 Other chronic allergic conjunctivitis: Secondary | ICD-10-CM | POA: Diagnosis not present

## 2021-08-09 ENCOUNTER — Other Ambulatory Visit: Payer: Self-pay

## 2021-08-09 ENCOUNTER — Ambulatory Visit: Payer: Medicare Other | Admitting: Family Medicine

## 2021-08-09 ENCOUNTER — Ambulatory Visit: Payer: Self-pay

## 2021-08-09 ENCOUNTER — Ambulatory Visit (INDEPENDENT_AMBULATORY_CARE_PROVIDER_SITE_OTHER): Payer: Medicare Other

## 2021-08-09 VITALS — BP 106/74 | HR 67 | Ht 66.0 in | Wt 160.2 lb

## 2021-08-09 DIAGNOSIS — M25562 Pain in left knee: Secondary | ICD-10-CM | POA: Diagnosis not present

## 2021-08-09 DIAGNOSIS — G8929 Other chronic pain: Secondary | ICD-10-CM

## 2021-08-09 DIAGNOSIS — M2170 Unequal limb length (acquired), unspecified site: Secondary | ICD-10-CM | POA: Diagnosis not present

## 2021-08-09 DIAGNOSIS — M1712 Unilateral primary osteoarthritis, left knee: Secondary | ICD-10-CM | POA: Diagnosis not present

## 2021-08-09 MED ORDER — AMBULATORY NON FORMULARY MEDICATION: 0 refills | Status: DC

## 2021-08-09 NOTE — Patient Instructions (Addendum)
Thank you for coming in today.   You received an injection today. Seek immediate medical attention if the joint becomes red, extremely painful, or is oozing fluid.   Please get an Xray today before you leave   I will send an order for orthotic to correct the leg length difference to [employer]otech prosthesis.   Recheck when needed.

## 2021-08-09 NOTE — Progress Notes (Signed)
I, [name], LAT, ATC, am serving as scribe for Dr. [name].  Jennifer Huang is a 70 y.o. female who presents to [place] at [facility] today for f/u of L knee pain.  She was last seen by Dr. Georgina Snell on 04/08/21 and had a Synvisc One gel injection.  Today, pt reports L knee pain returned in the later part of Sept. Pt c/o L knee pain when trying to extend knee. Pt has been wearing a compressive knee sleeve. Pt notes on Friday her L knee started to feel like it was going to "give out" on her. On Sunday, pt's dog ran into the lateral aspect of her L knee knocking her to the ground. Pt locates pain to the anterior and lateral aspect.  Additionally she notes a leg length discrepancy.  She notes her right leg is shorter than her left after a leg fracture.  She previously had an orthotic which helped a lot to correct her leg length discrepancy which improved her back pain.  The orthotic was years ago and wore out and has never been replaced.  She is interested in replacement orthotic if possible.  Diagnostic testing: L knee XR- 03/22/21  Pertinent review of systems: No fevers or chills  Relevant historical information: Hypertension.  Glaucoma.  History of gout.   Exam:  BP 106/74   Pulse 67   Ht 5\' 6"  (1.676 m)   Wt 160 lb 3.2 oz (72.7 kg)   SpO2 97%   BMI 25.86 kg/m  General: Well Developed, well nourished, and in no acute distress.   MSK: Left knee: Normal-appearing Range of motion 3-120 degrees. Nontender. Stable ligamentous exam. Intact strength. Negative McMurray test.  Leg lengths right leg approximately 1 cm shorter than left.    Lab and Radiology Results  Procedure: Real-time Ultrasound Guided Injection of left knee superior lateral patellar space Device: Philips Affiniti 50G Images permanently stored and available for review in PACS Verbal informed consent obtained.  Discussed risks and benefits of procedure. Warned about infection bleeding  damage to structures skin hypopigmentation and fat atrophy among others. Patient expresses understanding and agreement Time-out conducted.   Noted no overlying erythema, induration, or other signs of local infection.   Skin prepped in a sterile fashion.   Local anesthesia: Topical Ethyl chloride.   With sterile technique and under real time ultrasound guidance: 40 mg of Kenalog and 2 mL of lidocaine injected into knee joint. Fluid seen entering the joint capsule.   Completed without difficulty   Pain immediately resolved suggesting accurate placement of the medication.   Advised to call if fevers/chills, erythema, induration, drainage, or persistent bleeding.   Images permanently stored and available for review in the ultrasound unit.  Impression: Technically successful ultrasound guided injection.    X-ray images left knee obtained today personally and independently interpreted Minimal DJD.  No acute fractures. Await formal radiology review   MRI LUMBAR SPINE WITHOUT CONTRAST   Indication: Low back pain, > 6 wks, M54.40 Lumbago with sciatica,  unspecified side.   Comparison: None.   Technique: Noncontrast multiplanar, multiecho MR imaging of the lumbar  spine was performed, including T1-weighted and fluid sensitive sequences.    FINDINGS:  Conus: Appropriate appearance and position.    Alignment: The alignment of the lumbar spine is normal on these supine,  neutral images.    Marrow: Age-appropriate. No fracture.     T11-T12 seen only on the sagittal images; no posterior disc bulge, canal  narrowing, or foraminal narrowing at these levels.   T12-L1: No disc protrusion, canal stenosis, or foraminal stenosis. Facet  arthrosis.   L1-L2: No disc protrusion, canal stenosis, or foraminal stenosis. Facet  arthrosis. Ligamentum hypertrophy.   L2-L3: Facet arthrosis. Ligament hypertrophy. Mild posterior disc bulge  with prominent foraminal protrusions, left greater than right.  Left  foraminal protrusion with annular fissure. Mild narrowing of the left  lateral recess with disc material approximating the descending left L3  nerve root. The exiting left L2 nerve root is approximated by disc material  within a mildly narrowed left foramen.   L3-L4: Mild right eccentric disc bulge with annular fissure. Disc material  approximates the descending L4 nerve roots bilaterally. Facet arthrosis.  Ligament hypertrophy. Mild narrowing of the right subarticular recess. No  high-grade canal narrowing. Mild right neuroforaminal narrowing.   L4-L5: Right eccentric disc bulge. Facet arthrosis. Ligament hypertrophy.  No high-grade canal or subarticular recess narrowing. Right foraminal disc  protrusion with annular fissure is again noted. Mild right greater than  left neuroforaminal narrowing.   L5-S1: Facet arthrosis. Ligament hypertrophy. No high-grade canal or neural  foraminal narrowing.   Visualized upper sacrum: Intact. Symmetric degenerative changes.   Visualized soft tissues: No acute abnormality.   IMPRESSION:  Mild lumbar spondylosis without high-grade canal narrowing. Multilevel  subarticular recess and foraminal narrowing. See discussion above.   Electronically Signed by:  Patsy Lager, MD, [facility]  Electronically Signed on:  12/05/2020 1:55 PM Exam End: 12/05/20 12:50    Assessment and Plan: 70 y.o. female with left knee pain due to DJD.  Patient has more pain and dysfunction than I would expect based on her x-ray.  She has lost some range of motion as well.  She had a trial of hyaluronic acid injection left knee back in June which only worked for about 4 months.  Plan for repeat steroid injection today and repeat x-ray.  If not improving would consider MRI to further explore cause of knee pain.  Is possible that she has more isolated degenerative changes in the knee than x-ray is able to evaluate.   Chronic problem with exacerbation and uncertain  prognosis.  It is also possible that she has lumbar radiculopathy causing her leg pain.  She does have L4 lumbar radiculopathy seen on MRI lumbar spine from [facility] back in February.  However this would not explain the knee dysfunction that she is experiencing completely.  Addition she does have a leg length discrepancy.  Plan to write a prescription for biotech prosthesis for leg length corrected orthotic.  Should help back pain as well.   PDMP not reviewed this encounter. Orders Placed This Encounter  Procedures   [place] LIMITED JOINT SPACE STRUCTURES LOW LEFT(NO LINKED CHARGES)    Standing Status:   Future    Number of Occurrences:   1    Standing Expiration Date:   02/07/2022    Order Specific Question:   Reason for Exam (SYMPTOM  OR DIAGNOSIS REQUIRED)    Answer:   left knee pain    Order Specific Question:   Preferred imaging location?    Answer:   [place]   DG Knee AP/LAT W/[place] Left    Standing Status:   Future    Number of Occurrences:   1    Standing Expiration Date:   08/09/2022    Order Specific Question:   Reason for Exam (SYMPTOM  OR DIAGNOSIS REQUIRED)    Answer:   left  knee pain    Order Specific Question:   Preferred imaging location?    Answer:   [facility]   Meds ordered this encounter  Medications   AMBULATORY NON FORMULARY MEDICATION    Sig: Orthotic right shoe to correct leg length difference. Disp 1 or a pair.  Fax to [employer] prosthesis [place] [phone]    Dispense:  1 each    Refill:  0     Discussed warning signs or symptoms. Please see discharge instructions. Patient expresses understanding.   The above documentation has been reviewed and is accurate and complete [name], M.D.

## 2021-08-10 NOTE — Progress Notes (Signed)
Left knee x-ray shows mild arthritis changes

## 2021-08-17 ENCOUNTER — Other Ambulatory Visit: Payer: Self-pay

## 2021-08-17 ENCOUNTER — Ambulatory Visit (INDEPENDENT_AMBULATORY_CARE_PROVIDER_SITE_OTHER): Payer: Medicare Other

## 2021-08-17 DIAGNOSIS — Z23 Encounter for immunization: Secondary | ICD-10-CM | POA: Diagnosis not present

## 2021-08-23 DIAGNOSIS — E039 Hypothyroidism, unspecified: Secondary | ICD-10-CM | POA: Diagnosis not present

## 2021-08-29 ENCOUNTER — Telehealth: Payer: Self-pay | Admitting: Family Medicine

## 2021-08-29 DIAGNOSIS — Z8639 Personal history of other endocrine, nutritional and metabolic disease: Secondary | ICD-10-CM | POA: Diagnosis not present

## 2021-08-29 DIAGNOSIS — Z9009 Acquired absence of other part of head and neck: Secondary | ICD-10-CM | POA: Diagnosis not present

## 2021-08-29 DIAGNOSIS — E039 Hypothyroidism, unspecified: Secondary | ICD-10-CM | POA: Diagnosis not present

## 2021-08-29 DIAGNOSIS — E063 Autoimmune thyroiditis: Secondary | ICD-10-CM | POA: Diagnosis not present

## 2021-08-29 NOTE — Telephone Encounter (Signed)
Pt stopped by, she has not heard from [facility] regarding the prosthesis Dr. Georgina Snell ordered on 08/09/2021.  I called [facility], they have no record of this order.  Please refax ( in meds ) to [facility].  Pt aware and given [employer]'s phone #.

## 2021-08-31 MED ORDER — AMBULATORY NON FORMULARY MEDICATION: 0 refills | Status: DC

## 2021-08-31 NOTE — Telephone Encounter (Signed)
We will send today

## 2021-09-01 DIAGNOSIS — R229 Localized swelling, mass and lump, unspecified: Secondary | ICD-10-CM | POA: Diagnosis not present

## 2021-09-05 ENCOUNTER — Other Ambulatory Visit (HOSPITAL_BASED_OUTPATIENT_CLINIC_OR_DEPARTMENT_OTHER): Payer: Self-pay

## 2021-09-05 ENCOUNTER — Ambulatory Visit: Payer: Medicare Other | Attending: Internal Medicine

## 2021-09-05 DIAGNOSIS — Z23 Encounter for immunization: Secondary | ICD-10-CM

## 2021-09-05 MED ORDER — MODERNA COVID-19 BIVAL BOOSTER 50 MCG/0.5ML IM SUSP
INTRAMUSCULAR | 0 refills | Status: DC
  Filled 2021-09-05: qty 0.5, 1d supply, fill #0

## 2021-09-05 NOTE — Progress Notes (Signed)
   Covid-19 Vaccination Clinic  Name:  Jennifer Huang    MRN: [id number] DOB: 1951-04-02  09/05/2021  [name] was observed post Covid-19 immunization for 15 minutes without incident. She was provided with Vaccine Information Sheet and instruction to access the V-Safe system.   [name] was instructed to call 911 with any severe reactions post vaccine: Difficulty breathing  Swelling of face and throat  A fast heartbeat  A bad rash all over body  Dizziness and weakness   Immunizations Administered     Name Date Dose VIS Date Route   Moderna Covid-19 vaccine Bivalent Booster 09/05/2021  1:05 PM 0.5 mL 06/04/2021 Intramuscular   Manufacturer: Moderna   Lot: [id number]   [place]: [id number]

## 2021-09-08 DIAGNOSIS — H401122 Primary open-angle glaucoma, left eye, moderate stage: Secondary | ICD-10-CM | POA: Diagnosis not present

## 2021-09-08 DIAGNOSIS — H1132 Conjunctival hemorrhage, left eye: Secondary | ICD-10-CM | POA: Diagnosis not present

## 2021-09-26 NOTE — Progress Notes (Signed)
   I, [name], LAT, ATC, am serving as scribe for Dr. [name].  Jennifer Huang is a 70 y.o. female who presents to [place] [facility] today for f/u of acute-on-chronic L knee pain.  She was last seen by Dr. Georgina Snell on 08/09/21 after falling when her dog ran into the lateral aspect of her L knee.  She had a L knee steroid injection and was referred for an orthotic to help w/ a leg length discrepancy.  She had a prior Synvisc One injection on 04/08/21.  Today, pt reports that her L knee pain did well w/ the injection at her last visit.  She states that her L knee pain started to return at the end of November and is now interested in getting another gel injection.    Diagnostic testing: L knee XR- 08/09/21; 03/22/21  Pertinent review of systems: No fevers or chills  Relevant historical information: Hypertension   Exam:  BP 126/68 (BP Location: Left Arm, Patient Position: Sitting, Cuff Size: Normal)   Pulse 64   Ht 5\' 6"  (1.676 m)   Wt 161 lb (73 kg)   SpO2 97%   BMI 25.99 kg/m  General: Well Developed, well nourished, and in no acute distress.   MSK: Left knee normal motion with crepitation.    Lab and Radiology Results EXAM: LEFT KNEE 3 VIEWS   COMPARISON:  03/22/2021   FINDINGS: Normal alignment. No acute fracture or dislocation. Imaging is slightly limited by obliquity, however, minimal patellofemoral and medial compartment degenerative arthritis is present with subtle osteophyte formation and small effusion is again noted. Soft tissues are otherwise unremarkable.   IMPRESSION: Minimal bicompartmental degenerative arthritis.  Small effusion.     Electronically Signed   By: Fidela Salisbury M.D.   On: 08/10/2021 13:26 I, [name], personally (independently) visualized and performed the interpretation of the images attached in this note.      Assessment and Plan: 70 y.o. 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  Plan to  authorize Synvisc 1 and proceed with Synvisc 1 injection as early as December 17 which would be her 33-month mark.  Total encounter time 20 minutes including face-to-face time with the patient and, reviewing past medical record, and charting on the date of service.   Discussion treatment plan and options     Discussed warning signs or symptoms. Please see discharge instructions. Patient expresses understanding.   The above documentation has been reviewed and is accurate and complete [name], M.D.

## 2021-09-27 ENCOUNTER — Ambulatory Visit: Payer: Medicare Other | Admitting: Family Medicine

## 2021-09-27 ENCOUNTER — Telehealth: Payer: Self-pay

## 2021-09-27 ENCOUNTER — Other Ambulatory Visit: Payer: Self-pay

## 2021-09-27 ENCOUNTER — Encounter: Payer: Self-pay | Admitting: Family Medicine

## 2021-09-27 VITALS — BP 126/68 | HR 64 | Ht 66.0 in | Wt 161.0 lb

## 2021-09-27 DIAGNOSIS — G8929 Other chronic pain: Secondary | ICD-10-CM

## 2021-09-27 DIAGNOSIS — M25562 Pain in left knee: Secondary | ICD-10-CM

## 2021-09-27 DIAGNOSIS — M1712 Unilateral primary osteoarthritis, left knee: Secondary | ICD-10-CM

## 2021-09-27 NOTE — Telephone Encounter (Signed)
Spoke w/ pt and informed her she's still approved to receive Synvisc. She is scheduled for the gel injection on 12/20.

## 2021-09-27 NOTE — Patient Instructions (Addendum)
Good to see you today.  Schedule synvisc one injection left knee for Dec 21 or later.

## 2021-10-04 ENCOUNTER — Ambulatory Visit (INDEPENDENT_AMBULATORY_CARE_PROVIDER_SITE_OTHER): Payer: Medicare Other | Admitting: Internal Medicine

## 2021-10-04 ENCOUNTER — Encounter: Payer: Self-pay | Admitting: Internal Medicine

## 2021-10-04 ENCOUNTER — Ambulatory Visit (INDEPENDENT_AMBULATORY_CARE_PROVIDER_SITE_OTHER): Payer: Medicare Other

## 2021-10-04 ENCOUNTER — Other Ambulatory Visit: Payer: Self-pay

## 2021-10-04 VITALS — BP 118/70 | HR 77 | Temp 97.8°F | Ht 66.0 in | Wt 157.8 lb

## 2021-10-04 VITALS — BP 118/70 | HR 77 | Temp 97.8°F | Resp 16 | Ht 66.0 in | Wt 157.8 lb

## 2021-10-04 DIAGNOSIS — M1A0620 Idiopathic chronic gout, left knee, without tophus (tophi): Secondary | ICD-10-CM | POA: Diagnosis not present

## 2021-10-04 DIAGNOSIS — Z0000 Encounter for general adult medical examination without abnormal findings: Secondary | ICD-10-CM

## 2021-10-04 DIAGNOSIS — E049 Nontoxic goiter, unspecified: Secondary | ICD-10-CM

## 2021-10-04 DIAGNOSIS — I10 Essential (primary) hypertension: Secondary | ICD-10-CM

## 2021-10-04 DIAGNOSIS — E7800 Pure hypercholesterolemia, unspecified: Secondary | ICD-10-CM

## 2021-10-04 LAB — COMPREHENSIVE METABOLIC PANEL
ALT: 16 U/L (ref 0–35)
AST: 21 U/L (ref 0–37)
Albumin: 4.2 g/dL (ref 3.5–5.2)
Alkaline Phosphatase: 54 U/L (ref 39–117)
BUN: 22 mg/dL (ref 6–23)
CO2: 31 mEq/L (ref 19–32)
Calcium: 9.8 mg/dL (ref 8.4–10.5)
Chloride: 102 mEq/L (ref 96–112)
Creatinine, Ser: 0.72 mg/dL (ref 0.40–1.20)
GFR: 84.5 mL/min (ref >60.00)
Glucose, Bld: 125 mg/dL — ABNORMAL HIGH (ref 70–99)
Potassium: 4.1 mEq/L (ref 3.5–5.1)
Sodium: 138 mEq/L (ref 135–145)
Total Bilirubin: 0.8 mg/dL (ref 0.2–1.2)
Total Protein: 6.8 g/dL (ref 6.0–8.3)

## 2021-10-04 LAB — LIPID PANEL
Cholesterol: 223 mg/dL — ABNORMAL HIGH (ref 0–200)
HDL: 83.2 mg/dL (ref >39.00)
LDL Cholesterol: 129 mg/dL — ABNORMAL HIGH (ref 0–99)
NonHDL: 140.02
Total CHOL/HDL Ratio: 3
Triglycerides: 53 mg/dL (ref 0.0–149.0)
VLDL: 10.6 mg/dL (ref 0.0–40.0)

## 2021-10-04 LAB — CBC
HCT: 36.8 % (ref 36.0–46.0)
Hemoglobin: 11.9 g/dL — ABNORMAL LOW (ref 12.0–15.0)
MCHC: 32.3 g/dL (ref 30.0–36.0)
MCV: 90.2 fl (ref 78.0–100.0)
Platelets: 257 10*3/uL (ref 150.0–400.0)
RBC: 4.08 Mil/uL (ref 3.87–5.11)
RDW: 13.8 % (ref 11.5–15.5)
WBC: 6.7 10*3/uL (ref 4.0–10.5)

## 2021-10-04 LAB — URIC ACID: Uric Acid, Serum: 4.8 mg/dL (ref 2.4–7.0)

## 2021-10-04 LAB — T4, FREE: Free T4: 0.91 ng/dL (ref 0.60–1.60)

## 2021-10-04 LAB — TSH: TSH: 1.07 u[IU]/mL (ref 0.35–5.50)

## 2021-10-04 NOTE — Progress Notes (Signed)
° °  Subjective:   Patient ID: Jennifer Huang, female    DOB: 02-Jan-1951, 70 y.o.   MRN: [id number]  HPI The patient is a 70 YO female coming in for physical.   PMH, [place], social history reviewed and updated  Review of Systems  Constitutional: Negative.   HENT: Negative.    Eyes: Negative.   Respiratory:  Negative for cough, chest tightness and shortness of breath.   Cardiovascular:  Negative for chest pain, palpitations and leg swelling.  Gastrointestinal:  Negative for abdominal distention, abdominal pain, constipation, diarrhea, nausea and vomiting.  Musculoskeletal:  Positive for arthralgias, back pain and myalgias.  Skin: Negative.   Neurological: Negative.   Psychiatric/Behavioral: Negative.     Objective:  Physical Exam Constitutional:      Appearance: She is well-developed.  HENT:     Head: Normocephalic and atraumatic.  Cardiovascular:     Rate and Rhythm: Normal rate and regular rhythm.  Pulmonary:     Effort: Pulmonary effort is normal. No respiratory distress.     Breath sounds: Normal breath sounds. No wheezing or rales.  Abdominal:     General: Bowel sounds are normal. There is no distension.     Palpations: Abdomen is soft.     Tenderness: There is no abdominal tenderness. There is no rebound.  Musculoskeletal:        General: Tenderness present.     Cervical back: Normal range of motion.  Skin:    General: Skin is warm and dry.  Neurological:     Mental Status: She is alert and oriented to person, place, and time.     Coordination: Coordination normal.    Vitals:   10/04/21 0949  BP: 118/70  Pulse: 77  Temp: 97.8 F (36.6 C)  TempSrc: Oral  SpO2: 97%  Weight: 157 lb 12.8 oz (71.6 kg)  Height: 5\' 6"  (1.676 m)    This visit occurred during the SARS-CoV-2 public health emergency.  Safety protocols were in place, including screening questions prior to the visit, additional usage of staff PPE, and extensive cleaning of exam room while observing  appropriate contact time as indicated for disinfecting solutions.   Assessment & Plan:

## 2021-10-04 NOTE — Patient Instructions (Addendum)
We will check the labs today. 

## 2021-10-04 NOTE — Patient Instructions (Signed)
Jennifer Huang , Thank you for taking time to come for your Medicare Wellness Visit. I appreciate your ongoing commitment to your health goals. Please review the following plan we discussed and let me know if I can assist you in the future.   Screening recommendations/referrals: Colonoscopy: 09/22/2020; due every 10 years Mammogram: 10/12/2020; due every year Bone Density: 07/28/2019; due every 2 years Recommended yearly ophthalmology/optometry visit for glaucoma screening and checkup Recommended yearly dental visit for hygiene and checkup  Vaccinations: Influenza vaccine: 08/17/2021 Pneumococcal vaccine: 03/28/2016, 05/30/2021 Tdap vaccine: 10/17/2012; due every 10 years Shingles vaccine: never done; can check with local pharmacy   Covid-19: 12/05/2019, 01/05/2020, 08/17/2020, 09/05/2021  Advanced directives: Advance directive discussed with you today. I have provided a copy for you to complete at home and have notarized. Once this is complete please bring a copy in to our office so we can scan it into your chart.  Conditions/risks identified: Yes; Client understands the importance of follow-up with providers by attending scheduled visits and discussed goals to eat healthier, increase physical activity, exercise the brain, socialize more, get enough sleep and make time for laughter.  Next appointment: Please schedule your next Medicare Wellness Visit with your Nurse Health Advisor in 1 year by calling [phone].   Preventive Care 34 Years and Older, Female Preventive care refers to lifestyle choices and visits with your health care provider that can promote health and wellness. What does preventive care include? A yearly physical exam. This is also called an annual well check. Dental exams once or twice a year. Routine eye exams. Ask your health care provider how often you should have your eyes checked. Personal lifestyle choices, including: Daily care of your teeth and gums. Regular physical  activity. Eating a healthy diet. Avoiding tobacco and drug use. Limiting alcohol use. Practicing safe sex. Taking low-dose aspirin every day. Taking vitamin and mineral supplements as recommended by your health care provider. What happens during an annual well check? The services and screenings done by your health care provider during your annual well check will depend on your age, overall health, lifestyle risk factors, and family history of disease. Counseling  Your health care provider may ask you questions about your: Alcohol use. Tobacco use. Drug use. Emotional well-being. Home and relationship well-being. Sexual activity. Eating habits. History of falls. Memory and ability to understand (cognition). Work and work Statistician. Reproductive health. Screening  You may have the following tests or measurements: Height, weight, and BMI. Blood pressure. Lipid and cholesterol levels. These may be checked every 5 years, or more frequently if you are over 71 years old. Skin check. Lung cancer screening. You may have this screening every year starting at age 44 if you have a 30-pack-year history of smoking and currently smoke or have quit within the past 15 years. Fecal occult blood test (FOBT) of the stool. You may have this test every year starting at age 6. Flexible sigmoidoscopy or colonoscopy. You may have a sigmoidoscopy every 5 years or a colonoscopy every 10 years starting at age 63. Hepatitis C blood test. Hepatitis B blood test. Sexually transmitted disease (STD) testing. Diabetes screening. This is done by checking your blood sugar (glucose) after you have not eaten for a while (fasting). You may have this done every 1-3 years. Bone density scan. This is done to screen for osteoporosis. You may have this done starting at age 70. Mammogram. This may be done every 1-2 years. Talk to your health care provider about how  often you should have regular mammograms. Talk with your  health care provider about your test results, treatment options, and if necessary, the need for more tests. Vaccines  Your health care provider may recommend certain vaccines, such as: Influenza vaccine. This is recommended every year. Tetanus, diphtheria, and acellular pertussis (Tdap, Td) vaccine. You may need a Td booster every 10 years. Zoster vaccine. You may need this after age 35. Pneumococcal 13-valent conjugate (PCV13) vaccine. One dose is recommended after age 43. Pneumococcal polysaccharide (PPSV23) vaccine. One dose is recommended after age 43. Talk to your health care provider about which screenings and vaccines you need and how often you need them. This information is not intended to replace advice given to you by your health care provider. Make sure you discuss any questions you have with your health care provider. Document Released: 11/05/2015 Document Revised: 06/28/2016 Document Reviewed: 08/10/2015 Elsevier Interactive Patient Education  2017 [place] Prevention in the Home Falls can cause injuries. They can happen to people of all ages. There are many things you can do to make your home safe and to help prevent falls. What can I do on the outside of my home? Regularly fix the edges of walkways and driveways and fix any cracks. Remove anything that might make you trip as you walk through a door, such as a raised step or threshold. Trim any bushes or trees on the path to your home. Use bright outdoor lighting. Clear any walking paths of anything that might make someone trip, such as rocks or tools. Regularly check to see if handrails are loose or broken. Make sure that both sides of any steps have handrails. Any raised decks and porches should have guardrails on the edges. Have any leaves, snow, or ice cleared regularly. Use sand or salt on walking paths during winter. Clean up any spills in your garage right away. This includes oil or grease spills. What can I  do in the bathroom? Use night lights. Install grab bars by the toilet and in the tub and shower. Do not use towel bars as grab bars. Use non-skid mats or decals in the tub or shower. If you need to sit down in the shower, use a plastic, non-slip stool. Keep the floor dry. Clean up any water that spills on the floor as soon as it happens. Remove soap buildup in the tub or shower regularly. Attach bath mats securely with double-sided non-slip rug tape. Do not have throw rugs and other things on the floor that can make you trip. What can I do in the bedroom? Use night lights. Make sure that you have a light by your bed that is easy to reach. Do not use any sheets or blankets that are too big for your bed. They should not hang down onto the floor. Have a firm chair that has side arms. You can use this for support while you get dressed. Do not have throw rugs and other things on the floor that can make you trip. What can I do in the kitchen? Clean up any spills right away. Avoid walking on wet floors. Keep items that you use a lot in easy-to-reach places. If you need to reach something above you, use a strong step stool that has a grab bar. Keep electrical cords out of the way. Do not use floor polish or wax that makes floors slippery. If you must use wax, use non-skid floor wax. Do not have throw rugs and other things  on the floor that can make you trip. What can I do with my stairs? Do not leave any items on the stairs. Make sure that there are handrails on both sides of the stairs and use them. Fix handrails that are broken or loose. Make sure that handrails are as long as the stairways. Check any carpeting to make sure that it is firmly attached to the stairs. Fix any carpet that is loose or worn. Avoid having throw rugs at the top or bottom of the stairs. If you do have throw rugs, attach them to the floor with carpet tape. Make sure that you have a light switch at the top of the stairs  and the bottom of the stairs. If you do not have them, ask someone to add them for you. What else can I do to help prevent falls? Wear shoes that: Do not have high heels. Have rubber bottoms. Are comfortable and fit you well. Are closed at the toe. Do not wear sandals. If you use a stepladder: Make sure that it is fully opened. Do not climb a closed stepladder. Make sure that both sides of the stepladder are locked into place. Ask someone to hold it for you, if possible. Clearly mark and make sure that you can see: Any grab bars or handrails. First and last steps. Where the edge of each step is. Use tools that help you move around (mobility aids) if they are needed. These include: Canes. Walkers. Scooters. Crutches. Turn on the lights when you go into a dark area. Replace any light bulbs as soon as they burn out. Set up your furniture so you have a clear path. Avoid moving your furniture around. If any of your floors are uneven, fix them. If there are any pets around you, be aware of where they are. Review your medicines with your doctor. Some medicines can make you feel dizzy. This can increase your chance of falling. Ask your doctor what other things that you can do to help prevent falls. This information is not intended to replace advice given to you by your health care provider. Make sure you discuss any questions you have with your health care provider. Document Released: 08/05/2009 Document Revised: 03/16/2016 Document Reviewed: 11/13/2014 Elsevier Interactive Patient Education  2017 [employer].

## 2021-10-04 NOTE — Assessment & Plan Note (Signed)
Checking lipid panel and adjust as needed. Not on medication currently.  

## 2021-10-04 NOTE — Assessment & Plan Note (Signed)
Checking uric acid and adjust as needed. Has colchicine for as needed.

## 2021-10-04 NOTE — Assessment & Plan Note (Signed)
Flu shot up to date. Covid-19 up to date. Pneumonia complete. Shingrix counseled to get at pharmacy. Tetanus due 2023. Colonoscopy due 2031. Mammogram due 2022 scheduled already, pap smear aged out and dexa completed. Counseled about sun safety and mole surveillance. Counseled about the dangers of distracted driving. Given 10 year screening recommendations.

## 2021-10-04 NOTE — Assessment & Plan Note (Addendum)
Checking TSH and free T4. Adjust as needed synthroid 50 mcg daily. No swallowing changes.

## 2021-10-04 NOTE — Progress Notes (Signed)
Subjective:   Jennifer Huang is a 70 y.o. female who presents for Medicare Annual (Subsequent) preventive examination.  Review of Systems     Cardiac Risk Factors include: advanced age (>71men, >18 women);family history of premature cardiovascular disease;hypertension;dyslipidemia     Objective:    Today's Vitals   10/04/21 0930  BP: 118/70  Pulse: 77  Resp: 16  Temp: 97.8 F (36.6 C)  SpO2: 97%  Weight: 157 lb 12.8 oz (71.6 kg)  Height: 5\' 6"  (1.676 m)  PainSc: 0-No pain   Body mass index is 25.47 kg/m.  Advanced Directives 10/04/2021 09/28/2020 09/08/2019 09/01/2019 07/28/2019 07/25/2018 02/03/2017  Does Patient Have a Medical Advance Directive? No Yes No No No No No  Type of Advance Directive - [place]  Does patient want to make changes to medical advance directive? - No - Patient declined - - - - -  Copy of [place] in Chart? - No - copy requested - - - - -  Would patient like information on creating a medical advance directive? No - Patient declined - No - Patient declined No - Patient declined Yes (ED - Information included in AVS) Yes (ED - Information included in AVS) No - Patient declined    Current Medications (verified) Outpatient Encounter Medications as of 10/04/2021  Medication Sig   AMBULATORY NON FORMULARY MEDICATION Orthotic right shoe to correct leg length difference. Disp 1 or a pair.  Fax to [employer] prosthesis [place] M21.70   amLODipine (NORVASC) 10 MG tablet Take 1 tablet (10 mg total) by mouth daily.   Ascorbic Acid (VITAMIN C PO) Take by mouth.   aspirin EC 81 MG tablet Take 81 mg by mouth daily. Swallow whole.    Azelastine HCl 0.15 % SOLN Place 2 sprays into the nose every 12 (twelve) hours as needed.   brimonidine (ALPHAGAN P) 0.1 % SOLN 3 (three) times daily.   CALCIUM PO Take by mouth.   diclofenac Sodium (VOLTAREN) 1 % GEL Apply 2 g topically 4 (four) times daily as needed.     dorzolamide-timolol (COSOPT) 22.3-6.8 MG/ML ophthalmic solution 1 drop 2 (two) times daily.   EPINEPHrine 0.3 mg/0.3 mL IJ SOAJ injection Inject 0.3 mg into the muscle as needed for anaphylaxis. GENERIC TWINJECT PREFERRED    fexofenadine (ALLEGRA) 180 MG tablet Take 180 mg by mouth daily.   fluticasone (FLONASE) 50 MCG/ACT nasal spray Place 2 sprays into both nostrils daily.   furosemide (LASIX) 20 MG tablet TAKE 1 TABLET BY MOUTH  DAILY IF NEEDED   levothyroxine (SYNTHROID) 50 MCG tablet Take 50 mcg by mouth daily.   Multiple Vitamin (MULTIVITAMIN) tablet Take 1 tablet by mouth daily.   Olopatadine HCl 0.2 % SOLN Apply 1 drop to eye daily.   ROCKLATAN 0.02-0.005 % SOLN    TART CHERRY PO Take 1 tablet by mouth daily.    traMADol (ULTRAM) 50 MG tablet Take 1 tablet (50 mg total) by mouth daily as needed for severe pain.   vitamin B-12 (CYANOCOBALAMIN) 100 MCG tablet Take 100 mcg by mouth daily.   VITAMIN D PO Take by mouth.   [DISCONTINUED] COVID-19 mRNA bivalent vaccine, Moderna, (MODERNA COVID-19 BIVAL BOOSTER) 50 MCG/0.5ML injection Inject into the muscle.   azithromycin (ZITHROMAX) 250 MG tablet Sig as indicated   colchicine 0.6 MG tablet TAKE 1 TABLET (0.6 MG TOTAL) BY MOUTH DAILY AS NEEDED (GOUT PAIN).   cyclobenzaprine (FLEXERIL) 5 MG tablet Take  1 tablet (5 mg total) by mouth 3 (three) times daily as needed for muscle spasms.   lidocaine (LIDODERM) 5 % Place 1 patch onto the skin daily. Remove & Discard patch within 12 hours or as directed by MD   montelukast (SINGULAIR) 10 MG tablet Take 10 mg by mouth every evening.   No facility-administered encounter medications on file as of 10/04/2021.    Allergies (verified) Penicillins, Prednisone, and Sulfonamide derivatives   History: Past Medical History:  Diagnosis Date   Allergies    Allergy    year around seasonal allergies   Colon polyps    anal polyp   Diabetes mellitus without complication ([place])    pre DM-dieto controlled at  this time;   Gout, unspecified    Hyperlipidemia    on meds   Osteoporosis    per Dr. Sharlet Salina with proof from bone density scan    Primary localized osteoarthrosis, lower leg    Thyroid disease 08/2019   RIGHT thyroid removed   Thyroid mass    right   Unspecified essential hypertension    on meds   Unspecified glaucoma(365.9)    Left Eye   Past Surgical History:  Procedure Laterality Date   COLON SURGERY     anal polyp excised gen'l anesthesia   COLONOSCOPY  10-14-2010   jacobs-hx TA 2006   COLONOSCOPY  2016   jacobs-TA x 2 -23yr recall-miralax good   ENUCLEATION     OD 2nd to Glaucoma   OOPHORECTOMY     Had Blockage   POLYPECTOMY  2016   TA x 2   SALPINGECTOMY     Had Blockage   THYROIDECTOMY Right 09/08/2019   Procedure: RIGHT HEMI-THYROIDECTOMY;  Surgeon: Leta Baptist, MD;  Location: [facility];  Service: ENT;  Laterality: Right;   THYROIDECTOMY, PARTIAL Right    TOTAL ABDOMINAL HYSTERECTOMY  2002   WISDOM TOOTH EXTRACTION     Family History  Problem Relation Age of Onset   Osteoarthritis Mother    Hypertension Mother    Emphysema Father    Diabetes Father    Hypertension Father    Arthritis Sister        hip replacments, wrist surgery   Hyperlipidemia Sister    Hypertension Sister    Diabetes Brother    Hypertension Brother    Hyperlipidemia Brother    Cancer - Other Brother    Heart disease Brother    Hypertension Brother    Hyperlipidemia Brother    Diabetes Brother    Colon polyps Brother    Diabetes Brother    Heart disease Brother    Hypertension Brother    Hyperlipidemia Brother    Colon polyps Brother    Esophageal cancer Neg Hx    Rectal cancer Neg Hx    Stomach cancer Neg Hx    Social History   Socioeconomic History   Marital status: Widowed    Spouse name: Not on file   Number of children: 0   Years of education: Not on file   Highest education level: Not on file  Occupational History   Occupation: Runner, broadcasting/film/video: RETIRED   Occupation: SO - self-employed  Tobacco Use   Smoking status: Never   Smokeless tobacco: Never  Vaping Use   Vaping Use: Never used  Substance and Sexual Activity   Alcohol use: No    Alcohol/week: 0.0 standard drinks   Drug use: No   Sexual activity: Not Currently  Partners: Male    Birth control/protection: Surgical  Other Topics Concern   Not on file  Social History Narrative   HSG, @ year college. Married '73. No children. On disability - orthopedic: knee, back, shortened leg.. SO - self-employed.      Dec '12 - many stressors: husband with cancer, brother with pancreatic cancer, housing and financial issues.             Social Determinants of Health   Financial Resource Strain: Low Risk    Difficulty of Paying Living Expenses: Not hard at all  Food Insecurity: No Food Insecurity   Worried About Charity fundraiser in the Last Year: Never true   [place] in the Last Year: Never true  Transportation Needs: Unknown   Lack of Transportation (Medical): No   Lack of Transportation (Non-Medical): Not on file  Physical Activity: Sufficiently Active   Days of Exercise per Week: 5 days   Minutes of Exercise per Session: 30 min  Stress: No Stress Concern Present   Feeling of Stress : Not at all  Social Connections: Moderately Integrated   Frequency of Communication with Friends and Family: More than three times a week   Frequency of Social Gatherings with Friends and Family: Once a week   Attends Religious Services: More than 4 times per year   Active Member of [employer] or Organizations: Yes   Attends Archivist Meetings: More than 4 times per year   Marital Status: Widowed    Tobacco Counseling Counseling given: Not Answered   Clinical Intake:  Pre-visit preparation completed: Yes  Pain : No/denies pain Pain Score: 0-No pain     BMI - recorded: 25.47 Nutritional Status: BMI 25 -29 Overweight Nutritional Risks:  None Diabetes: No  How often do you need to have someone help you when you read instructions, pamphlets, or other written materials from your doctor or pharmacy?: 1 - Never What is the last grade level you completed in school?: High School Graduate  Diabetic? no  Interpreter Needed?: No  Information entered by :: Lisette Abu, LPN   Activities of Daily Living In your present state of health, do you have any difficulty performing the following activities: 10/04/2021  Hearing? N  Vision? N  Difficulty concentrating or making decisions? N  Walking or climbing stairs? N  Dressing or bathing? N  Doing errands, shopping? N  Preparing Food and eating ? N  Using the Toilet? N  In the past six months, have you accidently leaked urine? N  Do you have problems with loss of bowel control? N  Managing your Medications? N  Managing your Finances? N  Housekeeping or managing your Housekeeping? N  Some recent data might be hidden    Patient Care Team: Hoyt Koch, MD as PCP - General (Internal Medicine) Lomax, Marny Lowenstein, MD (Inactive) (Obstetrics and Gynecology) Marylynn Pearson, MD (Ophthalmology) Mosetta Anis, MD (Allergy) Rulon Sera, MD (Rehabilitation) Leta Baptist, MD as Consulting Physician (Otolaryngology) Charlton Haws, [facility] as Pharmacist (Pharmacist) Golden Circle, MD as Referring Physician (Ophthalmology)  Indicate any recent Medical Services you may have received from other than [facility] providers in the past year (date may be approximate).     Assessment:   This is a routine wellness examination for [employer]  Hearing/Vision screen Hearing Screening - Comments:: Patient denied any hearing difficulty. No hearing aids. Vision Screening - Comments:: Patient wears eyeglasses. Currently being treated for glaucoma at [facility] with  Dr. Margy Clarks, MD, PhD.  Dietary issues and exercise activities discussed: Current Exercise Habits: Home exercise  routine, Type of exercise: walking;Other - see comments (step climbing, gardening), Time (Minutes): 30, Frequency (Times/Week): 5, Weekly Exercise (Minutes/Week): 150, Intensity: Mild, Exercise limited by: orthopedic condition(s)   Goals Addressed   None   Depression Screen PHQ 2/9 Scores 10/04/2021 09/28/2020 07/28/2019 07/25/2018 07/24/2017 04/28/2016  PHQ - 2 Score 0 0 0 1 0 0  PHQ- 9 Score - - - 3 - -    Fall Risk Fall Risk  10/04/2021 09/28/2020 07/28/2019 07/25/2018 07/24/2017  Falls in the past year? 1 1 1  Yes Yes  Number falls in past yr: 0 1 0 1 1  Injury with Fall? 1 1 1  Yes Yes  Risk for fall due to : - Orthopedic patient;Impaired balance/gait - Impaired mobility -  Follow up Falls prevention discussed Falls evaluation completed;Education provided - Falls prevention discussed;Education provided -    FALL RISK PREVENTION PERTAINING TO THE HOME:  Any stairs in or around the home? Yes  If so, are there any without handrails? No  Home free of loose throw rugs in walkways, pet beds, electrical cords, etc? Yes  Adequate lighting in your home to reduce risk of falls? Yes   ASSISTIVE DEVICES UTILIZED TO PREVENT FALLS:  Life alert? No  Use of a cane, walker or w/c? No  Grab bars in the bathroom? Yes  Shower chair or bench in shower? Yes  Elevated toilet seat or a handicapped toilet? Yes   TIMED UP AND GO:  Was the test performed? Yes .  Length of time to ambulate 10 feet: 6 sec.   Gait steady and fast without use of assistive device  Cognitive Function: Normal cognitive status assessed by direct observation by this Nurse Health Advisor. No abnormalities found.   MMSE - Mini Mental State Exam 07/25/2018  Orientation to time 5  Orientation to Place 5  Registration 3  Attention/ Calculation 5  Recall 1  Language- name 2 objects 2  Language- repeat 1  Language- follow 3 step command 3  Language- read & follow direction 1  Write a sentence 1  Copy design 1  Total score 28      6CIT Screen 07/28/2019  What Year? 0 points  What month? 0 points  What time? 0 points  Count back from 20 0 points  Months in reverse 0 points  Repeat phrase 2 points  Total Score 2    Immunizations Immunization History  Administered Date(s) Administered   Fluad Quad(high Dose 65+) 07/28/2019, 07/05/2020, 08/17/2021   Influenza Whole 11/09/2009   Influenza, High Dose Seasonal PF 08/31/2016, 07/24/2017, 07/25/2018   Influenza, Seasonal, Injecte, Preservative Fre 10/17/2012   Influenza,inj,Quad PF,6+ Mos 07/10/2014   Moderna Covid-19 Vaccine Bivalent Booster 14yrs & up 09/05/2021   Moderna Sars-Covid-2 Vaccination 12/05/2019, 01/05/2020, 08/17/2020   PNEUMOCOCCAL CONJUGATE-20 05/30/2021   Pneumococcal Conjugate-13 07/24/2017   Pneumococcal Polysaccharide-23 03/28/2016   Tetanus 10/17/2012    TDAP status: Up to date  Flu Vaccine status: Up to date  Pneumococcal vaccine status: Up to date  Covid-19 vaccine status: Completed vaccines  Qualifies for Shingles Vaccine? Yes   Zostavax completed No   Shingrix Completed?: No.    Education has been provided regarding the importance of this vaccine. Patient has been advised to call insurance company to determine out of pocket expense if they have not yet received this vaccine. Advised may also receive vaccine at local pharmacy or Health  Dept. Verbalized acceptance and understanding.  Screening Tests Health Maintenance  Topic Date Due   Zoster Vaccines- Shingrix (1 of 2) Never done   MAMMOGRAM  10/06/2021   TETANUS/TDAP  10/17/2022   COLONOSCOPY (Pts 45-55yrs Insurance coverage will need to be confirmed)  09/22/2030   Pneumonia Vaccine 35+ Years old  Completed   INFLUENZA VACCINE  Completed   DEXA SCAN  Completed   COVID-19 Vaccine  Completed   Hepatitis C Screening  Completed   HPV VACCINES  Aged Out    Health Maintenance  Health Maintenance Due  Topic Date Due   Zoster Vaccines- Shingrix (1 of 2) Never done     Colorectal cancer screening: Type of screening: Colonoscopy. Completed 09/22/2020. Repeat every 10 years  Mammogram status: Completed 09/2021. Repeat every year  Bone Density status: Completed 10/58/2020. Results reflect: Bone density results: OSTEOPENIA. Repeat every 2 years.  Lung Cancer Screening: (Low Dose CT Chest recommended if Age 71-80 years, 30 pack-year currently smoking OR have quit w/in 15years.) does not qualify.   Lung Cancer Screening Referral: no  Additional Screening:  Hepatitis C Screening: does qualify; Completed yes  Vision Screening: Recommended annual ophthalmology exams for early detection of glaucoma and other disorders of the eye. Is the patient up to date with their annual eye exam?  Yes  Who is the provider or what is the name of the office in which the patient attends annual eye exams? Margy Clarks, MD, PhD If pt is not established with a provider, would they like to be referred to a provider to establish care? No .   Dental Screening: Recommended annual dental exams for proper oral hygiene  Community Resource Referral / Chronic Care Management: CRR required this visit?  No   CCM required this visit?  No      Plan:     I have personally reviewed and noted the following in the patients chart:   Medical and social history Use of alcohol, tobacco or illicit drugs  Current medications and supplements including opioid prescriptions.  Functional ability and status Nutritional status Physical activity Advanced directives List of other physicians Hospitalizations, surgeries, and ER visits in previous 12 months Vitals Screenings to include cognitive, depression, and falls Referrals and appointments  In addition, I have reviewed and discussed with patient certain preventive protocols, quality metrics, and best practice recommendations. A written personalized care plan for preventive services as well as general preventive health recommendations were  provided to patient.     Sheral Flow, LPN   [id number]/1275   Nurse Notes:  Hearing Screening - Comments:: Patient denied any hearing difficulty. No hearing aids. Vision Screening - Comments:: Patient wears eyeglasses. Currently being treated for glaucoma at [facility] with Dr. Margy Clarks, MD, PhD.

## 2021-10-04 NOTE — Assessment & Plan Note (Signed)
BP at goal on amlodipine 10 mg daily and lasix 20 mg daily prn. Checking labs and adjust as needed.

## 2021-10-12 ENCOUNTER — Ambulatory Visit: Payer: Self-pay

## 2021-10-12 ENCOUNTER — Ambulatory Visit: Payer: Medicare Other | Admitting: Family Medicine

## 2021-10-12 ENCOUNTER — Other Ambulatory Visit: Payer: Self-pay

## 2021-10-12 DIAGNOSIS — M1712 Unilateral primary osteoarthritis, left knee: Secondary | ICD-10-CM

## 2021-10-12 DIAGNOSIS — M25562 Pain in left knee: Secondary | ICD-10-CM | POA: Diagnosis not present

## 2021-10-12 DIAGNOSIS — G8929 Other chronic pain: Secondary | ICD-10-CM

## 2021-10-12 NOTE — Progress Notes (Signed)
Georgene presents to clinic today for Synvisc-One injection left knee  Procedure: Real-time Ultrasound Guided Injection of left knee superior lateral patellar space Device: Philips Affiniti 50G Images permanently stored and available for review in PACS Verbal informed consent obtained.  Discussed risks and benefits of procedure. Warned about infection bleeding damage to structures skin hypopigmentation and fat atrophy among others. Patient expresses understanding and agreement Time-out conducted.   Noted no overlying erythema, induration, or other signs of local infection.   Skin prepped in a sterile fashion.   Local anesthesia: Topical Ethyl chloride.   With sterile technique and under real time ultrasound guidance: Synvisc-One (70ml) injected into knee joint. Fluid seen entering the joint capsule.   Completed without difficulty   Advised to call if fevers/chills, erythema, induration, drainage, or persistent bleeding.   Images permanently stored and available for review in the ultrasound unit.  Impression: Technically successful ultrasound guided injection.   Lot number: [id number]  Return as needed

## 2021-10-12 NOTE — Patient Instructions (Signed)
Thank you for coming in today.   You received an injection today. Seek immediate medical attention if the joint becomes red, extremely painful, or is oozing fluid.   Recheck back as needed

## 2021-10-13 ENCOUNTER — Encounter: Payer: Self-pay | Admitting: Internal Medicine

## 2021-10-13 DIAGNOSIS — Z1231 Encounter for screening mammogram for malignant neoplasm of breast: Secondary | ICD-10-CM | POA: Diagnosis not present

## 2021-10-18 ENCOUNTER — Other Ambulatory Visit: Payer: Self-pay

## 2021-10-18 ENCOUNTER — Ambulatory Visit: Payer: Self-pay

## 2021-10-18 ENCOUNTER — Ambulatory Visit: Payer: Medicare Other | Admitting: Family Medicine

## 2021-10-18 VITALS — BP 132/86 | HR 63 | Ht 66.0 in | Wt 162.4 lb

## 2021-10-18 DIAGNOSIS — G8929 Other chronic pain: Secondary | ICD-10-CM

## 2021-10-18 DIAGNOSIS — M25562 Pain in left knee: Secondary | ICD-10-CM | POA: Diagnosis not present

## 2021-10-18 MED ORDER — IBUPROFEN 600 MG PO TABS: 600.0000 mg | ORAL_TABLET | Freq: Three times a day (TID) | ORAL | 0 refills | Status: DC | PRN

## 2021-10-18 NOTE — Progress Notes (Signed)
I, Peterson Lombard, LAT, ATC acting as a scribe for [name], MD.  Jennifer Huang is a 70 y.o. female who presents to [place] at [facility] today for cont L knee pain and weakness. Pt was last seen by Dr. Georgina Snell on 12/21 and was given Synvisc-1 in her L knee. Today, pt reports L knee started hurting again around 12/25. Pt c/o L knee feeling like it's going to "give out" on her. Pt locates pain to the anterior aspect of the L knee.  She had a steroid injection August 09, 2021 left knee which helped for a month or 2.  Dx imaging: 08/09/21 L knee XR  Pertinent review of systems: No fevers or chills  Relevant historical information: Hypertension.   Exam:  BP 132/86    Pulse 63    Ht 5\' 6"  (1.676 m)    Wt 162 lb 6.4 oz (73.7 kg)    SpO2 97%    BMI 26.21 kg/m  General: Well Developed, well nourished, and in no acute distress.   MSK: Left knee no effusion normal motion with crepitation.  Tender palpation anterior to medial knee. Pain with resisted knee extension and flexion.     Lab and Radiology Results  Diagnostic Limited MSK Ultrasound of: Left knee  Patellar tendon normal-appearing Medial joint line narrowed and degenerative appearing medial meniscus.   Pes anserine bursa normal-appearing. No Baker's cyst. Impression: No clear explanation for severity of knee pain.  Medial degenerative changes present.   EXAM: LEFT KNEE 3 VIEWS   COMPARISON:  03/22/2021   FINDINGS: Normal alignment. No acute fracture or dislocation. Imaging is slightly limited by obliquity, however, minimal patellofemoral and medial compartment degenerative arthritis is present with subtle osteophyte formation and small effusion is again noted. Soft tissues are otherwise unremarkable.   IMPRESSION: Minimal bicompartmental degenerative arthritis.  Small effusion.     Electronically Signed   By: Fidela Salisbury M.D.   On: 08/10/2021 13:26   I, [name], personally  (independently) visualized and performed the interpretation of the images attached in this note.    Assessment and Plan: 70 y.o. female with persistent chronic left knee pain.  Patient has failed conservative management for left knee pain for presumed knee DJD.  However she does not have severe knee DJD seen on x-ray.  At this point plan for MRI to fully explain her knee pain looking for worsening DJD that is seen on x-ray or other explanation such as tendinitis not visible on ultrasound.  This MRI additionally will be for potential steroid injection planning or for surgical planning.  For now we discussed pain management.  Limited ibuprofen used sparingly for pain control in addition to Tylenol.  Recheck after MRI.  Chronic problem with exacerbation with uncertain prognosis.   PDMP not reviewed this encounter. Orders Placed This Encounter  Procedures   [place] LIMITED JOINT SPACE STRUCTURES LOW LEFT(NO LINKED CHARGES)    Standing Status:   Future    Number of Occurrences:   1    Standing Expiration Date:   04/18/2022    Order Specific Question:   Reason for Exam (SYMPTOM  OR DIAGNOSIS REQUIRED)    Answer:   left knee pain    Order Specific Question:   Preferred imaging location?    Answer:   [place]   MR Knee Left  Wo Contrast    Standing Status:   Future    Standing Expiration Date:   10/18/2022  Order Specific Question:   What is the patient's sedation requirement?    Answer:   No Sedation    Order Specific Question:   Does the patient have a pacemaker or implanted devices?    Answer:   No    Order Specific Question:   Preferred imaging location?    Answer:   [facility] (table limit-550lbs)   Meds ordered this encounter  Medications   ibuprofen (ADVIL) 600 MG tablet    Sig: Take 1 tablet (600 mg total) by mouth every 8 (eight) hours as needed.    Dispense:  90 tablet    Refill:  0     Discussed warning signs or symptoms. Please see  discharge instructions. Patient expresses understanding.   The above documentation has been reviewed and is accurate and complete [name], M.D.

## 2021-10-18 NOTE — Progress Notes (Deleted)
° °  I, Jennifer Huang, LAT, ATC acting as a scribe for Jennifer Leader, MD.  Jennifer Huang is a 70 y.o. female who presents to [place] at [facility] today for f/u L knee pain and weakness. Pt was last seen by Dr. Georgina Snell on 10/12/21 and was given a L knee Synvisc-One injection. Today, pt reports  Dx imaging: 08/09/21 L knee XR  Pertinent review of systems: ***  Relevant historical information: ***   Exam:  There were no vitals taken for this visit. General: Well Developed, well nourished, and in no acute distress.   MSK: ***    Lab and Radiology Results No results found for this or any previous visit (from the past 72 hour(s)). No results found.     Assessment and Plan: 71 y.o. female with ***   PDMP not reviewed this encounter. No orders of the defined types were placed in this encounter.  No orders of the defined types were placed in this encounter.    Discussed warning signs or symptoms. Please see discharge instructions. Patient expresses understanding.   ***

## 2021-10-18 NOTE — Patient Instructions (Addendum)
Thank you for coming in today.   You should hear from MRI scheduling within 1 week. If you do not hear please let me know.    Recheck after the MRI.   Use ibuprofen sparingly.

## 2021-10-27 ENCOUNTER — Telehealth: Payer: Self-pay

## 2021-10-27 NOTE — Progress Notes (Signed)
Chronic Care Management Pharmacy Assistant   Name: Jennifer Huang  MRN: [id number] DOB: 09-Oct-1951  Reason for Encounter: Disease State - Hypertension Appointment: Telephone 11/21/21 @ 10 am   Recent office visits:  10/04/21 Sharlet Salina (PCP) Annual Exam. No med changes.  06/22/21 Sagardia ([facility]) - Video Visit. Covid virus. Start Zithromax 250 mg.  05/30/21 Crawford (PCP) - Need for pneumococcal vaccination. Presence of artificial right eye.  04/08/21 Crawford (PCP) - DDD (degenerative disc disease), lumbar. No med changes.  10/21/20 Crawford (PCP) - Dysuria. Start MACROBID 100 mg.  09/28/20 Crawford (PCP) - Annual exam. No med changes.  Recent consult visits:   10/18/21 Georgina Snell (Sports Med) - Primary osteoarthritis of left knee. No med changes. Start Ibuprofen.  08/09/21 Georgina Snell (Sports Med) - Primary osteoarthritis of left knee. No med changes. Orthotic right shoe to correct leg length difference.  07/13/21 Tseng (Ophthalmology) - Primary open-angle glaucoma, left eye, moderate stage.  03/22/21 Georgina Snell (Sports Med) - Primary osteoarthritis of left knee. No med changes. Referral to PT. D/c Macrobid.  03/07/21 Tseng (Ophthalmology) - Primary open-angle glaucoma, left eye, moderate stage.  01/25/21 Georgina Snell (Sports Med) - Primary osteoarthritis of left knee. No med changes. Referral to PT.  11/29/2020 Oleta Mouse [facility]) - Acute right-sided low back pain with sciatica, sciatica laterality unspecified.  11/11/2020 Smith (Sports Medicine) - Primary Osteoarthritis of left knee. No med changes.  10/20/20 [facility]) - Intervertebral disc disorders with radiculopathy, lumbar region.  10/06/20 Tseng (Ophthalmology) - Primary open-angle glaucoma, left eye, moderate stage.  09/28/20 Smith (Sports Medicine) - Chronic pain of left knee. No med changes.  09/07/20 Averneni (Endocrinology) - Abnormal results thyroid function studies.   Hospital visits:  None in previous 6  months  Medications: Outpatient Encounter Medications as of 10/27/2021  Medication Sig   AMBULATORY NON FORMULARY MEDICATION Orthotic right shoe to correct leg length difference. Disp 1 or a pair.  Fax to [employer] prosthesis [place] M21.70   amLODipine (NORVASC) 10 MG tablet Take 1 tablet (10 mg total) by mouth daily.   Ascorbic Acid (VITAMIN C PO) Take by mouth.   aspirin EC 81 MG tablet Take 81 mg by mouth daily. Swallow whole.    Azelastine HCl 0.15 % SOLN Place 2 sprays into the nose every 12 (twelve) hours as needed.   azithromycin (ZITHROMAX) 250 MG tablet Sig as indicated   brimonidine (ALPHAGAN P) 0.1 % SOLN 3 (three) times daily.   CALCIUM PO Take by mouth.   colchicine 0.6 MG tablet TAKE 1 TABLET (0.6 MG TOTAL) BY MOUTH DAILY AS NEEDED (GOUT PAIN).   cyclobenzaprine (FLEXERIL) 5 MG tablet Take 1 tablet (5 mg total) by mouth 3 (three) times daily as needed for muscle spasms.   diclofenac Sodium (VOLTAREN) 1 % GEL Apply 2 g topically 4 (four) times daily as needed.    dorzolamide-timolol (COSOPT) 22.3-6.8 MG/ML ophthalmic solution 1 drop 2 (two) times daily.   EPINEPHrine 0.3 mg/0.3 mL IJ SOAJ injection Inject 0.3 mg into the muscle as needed for anaphylaxis. GENERIC TWINJECT PREFERRED    fexofenadine (ALLEGRA) 180 MG tablet Take 180 mg by mouth daily.   fluticasone (FLONASE) 50 MCG/ACT nasal spray Place 2 sprays into both nostrils daily.   furosemide (LASIX) 20 MG tablet TAKE 1 TABLET BY MOUTH  DAILY IF NEEDED   ibuprofen (ADVIL) 600 MG tablet Take 1 tablet (600 mg total) by mouth every 8 (eight) hours as needed.   levothyroxine (SYNTHROID) 50 MCG tablet Take 50  mcg by mouth daily.   lidocaine (LIDODERM) 5 % Place 1 patch onto the skin daily. Remove & Discard patch within 12 hours or as directed by MD   montelukast (SINGULAIR) 10 MG tablet Take 10 mg by mouth every evening.   Multiple Vitamin (MULTIVITAMIN) tablet Take 1 tablet by mouth daily.   Olopatadine HCl 0.2 % SOLN Apply 1  drop to eye daily.   ROCKLATAN 0.02-0.005 % SOLN    TART CHERRY PO Take 1 tablet by mouth daily.    traMADol (ULTRAM) 50 MG tablet Take 1 tablet (50 mg total) by mouth daily as needed for severe pain.   vitamin B-12 (CYANOCOBALAMIN) 100 MCG tablet Take 100 mcg by mouth daily.   VITAMIN D PO Take by mouth.   No facility-administered encounter medications on file as of 10/27/2021.   Reviewed chart prior to disease state call. Spoke with patient regarding BP  Recent Office Vitals: BP Readings from Last 3 Encounters:  10/18/21 132/86  10/04/21 118/70  10/04/21 118/70   Pulse Readings from Last 3 Encounters:  10/18/21 63  10/04/21 77  10/04/21 77    Wt Readings from Last 3 Encounters:  10/18/21 162 lb 6.4 oz (73.7 kg)  10/04/21 157 lb 12.8 oz (71.6 kg)  10/04/21 157 lb 12.8 oz (71.6 kg)     Kidney Function Lab Results  Component Value Date/Time   CREATININE 0.72 10/04/2021 10:27 AM   CREATININE 0.87 05/31/2020 02:40 PM   CREATININE 1.11 (H) 09/04/2019 03:00 PM   GFR 84.50 10/04/2021 10:27 AM   GFRNONAA 51 (L) 09/04/2019 03:00 PM   GFRAA 59 (L) 09/04/2019 03:00 PM    BMP Latest Ref Rng & Units 10/04/2021 05/31/2020 09/04/2019  Glucose 70 - 99 mg/dL 125(H) 90 154(H)  BUN 6 - 23 mg/dL 22 20 12   Creatinine 0.40 - 1.20 mg/dL 0.72 0.87 1.11(H)  BUN/Creat Ratio 6 - 22 (calc) - NOT APPLICABLE -  Sodium 229 - 145 mEq/L 138 140 139  Potassium 3.5 - 5.1 mEq/L 4.1 4.0 4.5  Chloride 96 - 112 mEq/L 102 103 102  CO2 19 - 32 mEq/L 31 28 27   Calcium 8.4 - 10.5 mg/dL 9.8 10.2 9.5    Current antihypertensive regimen:  Amodipine 10 mg daily Furosemide 20 mg daily PRN  Reviewed chart for medication changes and drug therapy problems ahead of medication adherence call.  Attempted to contact patient  for medication review and health check, unable to reach patient, left voicemails to return call.   Care Gaps Colonoscopy - 09/22/20 Diabetic Foot Exam - NA Mammogram - 10/13/21 Ophthalmology  - NA Dexa Scan - NA Annual Well Visit - NA Micro albumin - NA Hemoglobin A1c - NA  Star Rating Drugs: None listed  Orinda Kenner, [facility] Clinical Pharmacists Assistant [phone]

## 2021-11-12 ENCOUNTER — Ambulatory Visit
Admission: RE | Admit: 2021-11-12 | Discharge: 2021-11-12 | Disposition: A | Discharge status: Home / Self Care | Payer: Medicare Other | Source: Ambulatory Visit | Attending: Family Medicine | Admitting: Family Medicine

## 2021-11-12 ENCOUNTER — Other Ambulatory Visit: Payer: Self-pay

## 2021-11-12 DIAGNOSIS — M25562 Pain in left knee: Secondary | ICD-10-CM | POA: Diagnosis not present

## 2021-11-12 DIAGNOSIS — G8929 Other chronic pain: Secondary | ICD-10-CM

## 2021-11-14 NOTE — Progress Notes (Signed)
Knee MRI shows medium arthritis changes visible on on the MRI.  Additionally this is causing joint swelling and inflammation of the joint lining. 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 The next step should probably be a cortisone injection.  Recommend that you return to clinic to discuss the MRI in full detail and likely proceed with steroid injection.

## 2021-11-16 NOTE — Progress Notes (Signed)
I, Jennifer Huang, LAT, ATC, am serving as scribe for Dr. [name].  Jennifer Huang is a 71 y.o. female who presents to [place] [facility] today for f/u of L knee pain and L knee MRI review.  She was last seen by Dr. Georgina Snell on 10/18/21 and noted return of L knee pain after having a Synvisc-1 injection on 10/12/21.  She had a prior L knee steroid injection on 08/09/21.  Today, pt reports that her pain is stiff when she first gets out of bed. She is interested in a repeat steroid injection if possible.  Diagnostic testing: L knee MRI- 11/12/21; L knee XR- 08/09/21   Pertinent review of systems: No fevers or chills  Relevant historical information: Leg length discrepancy improved with orthotic   Exam:  BP 104/62 (BP Location: Left Arm, Patient Position: Sitting, Cuff Size: Normal)    Pulse 65    Ht 5\' 6"  (1.676 m)    Wt 166 lb (75.3 kg)    SpO2 96%    BMI 26.79 kg/m  General: Well Developed, well nourished, and in no acute distress.   MSK: Left knee mild effusion normal motion. Nontender palpation medial joint line. Intact strength    Lab and Radiology Results  Procedure: Real-time Ultrasound Guided Injection of left knee superior lateral patellar space Device: Philips Affiniti 50G Images permanently stored and available for review in PACS Verbal informed consent obtained.  Discussed risks and benefits of procedure. Warned about infection bleeding damage to structures skin hypopigmentation and fat atrophy among others. Patient expresses understanding and agreement Time-out conducted.   Noted no overlying erythema, induration, or other signs of local infection.   Skin prepped in a sterile fashion.   Local anesthesia: Topical Ethyl chloride.   With sterile technique and under real time ultrasound guidance: 40 mg of Kenalog and 2 mL of Marcaine injected into knee joint. Fluid seen entering the joint capsule.   Completed without difficulty   Pain  immediately resolved suggesting accurate placement of the medication.   Advised to call if fevers/chills, erythema, induration, drainage, or persistent bleeding.   Images permanently stored and available for review in the ultrasound unit.  Impression: Technically successful ultrasound guided injection.    EXAM: MRI OF THE LEFT KNEE WITHOUT CONTRAST   TECHNIQUE: Multiplanar, multisequence MR imaging of the knee was performed. No intravenous contrast was administered.   COMPARISON:  Radiographs 08/09/2021   FINDINGS: MENISCI   Medial meniscus:  Intact   Lateral meniscus:  Intact   LIGAMENTS   Cruciates: The PCL is intact. Severe mucoid degeneration of the ACL.   Collaterals:  Intact   CARTILAGE   Patellofemoral:  Moderate degenerative chondrosis.   Medial: Moderate degenerative chondrosis with early joint space narrowing and spurring.   Lateral: Moderate degenerative chondrosis with early spurring changes.   Joint:  Moderate-sized joint effusion and moderate synovitis.   Popliteal Fossa:  No popliteal mass or Baker's cyst.   Extensor Mechanism: The patella retinacular structures are intact and the quadriceps and patellar tendons are intact.   Bones:  No acute bony findings. No osteochondral abnormality.   Other: Unremarkable knee musculature.   IMPRESSION: 1. Intact ligamentous structures and no acute bony findings. 2. No meniscal tears. 3. 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 4. Tricompartmental degenerative changes. 5. Moderate-sized joint effusion and moderate synovitis.     Electronically Signed   By: Marijo Sanes M.D.   On: 11/13/2021 12:04  I, [name], personally (independently) visualized and performed the interpretation of the images attached in this note.      Assessment and Plan: 71 y.o. 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  DJD on MRI is worse than was appreciated on x-ray.  She does have  mucoid degeneration of the ACL but I do not think with her symptoms ACL reconstruction makes any sense.  If she were to have surgery knee replacement probably would be the best surgical option.  Plan for repeat steroid injection today and recheck back as needed.  If conservative management options are failing her would recommend referral to orthopedic surgery to discuss total knee replacement.   PDMP not reviewed this encounter. Orders Placed This Encounter  Procedures   [place] LIMITED JOINT SPACE STRUCTURES LOW LEFT(NO LINKED CHARGES)    Order Specific Question:   Reason for Exam (SYMPTOM  OR DIAGNOSIS REQUIRED)    Answer:   L knee pain    Order Specific Question:   Preferred imaging location?    Answer:   [place]   No orders of the defined types were placed in this encounter.    Discussed warning signs or symptoms. Please see discharge instructions. Patient expresses understanding.   The above documentation has been reviewed and is accurate and complete [name], M.D.

## 2021-11-17 ENCOUNTER — Ambulatory Visit: Payer: Medicare Other | Admitting: Family Medicine

## 2021-11-17 ENCOUNTER — Other Ambulatory Visit: Payer: Self-pay

## 2021-11-17 ENCOUNTER — Encounter: Payer: Self-pay | Admitting: Family Medicine

## 2021-11-17 ENCOUNTER — Ambulatory Visit: Payer: Self-pay

## 2021-11-17 VITALS — BP 104/62 | HR 65 | Ht 66.0 in | Wt 166.0 lb

## 2021-11-17 DIAGNOSIS — M1712 Unilateral primary osteoarthritis, left knee: Secondary | ICD-10-CM

## 2021-11-17 DIAGNOSIS — M25562 Pain in left knee: Secondary | ICD-10-CM

## 2021-11-17 DIAGNOSIS — G8929 Other chronic pain: Secondary | ICD-10-CM | POA: Diagnosis not present

## 2021-11-17 NOTE — Patient Instructions (Addendum)
Good to see you today.  You had a L knee steroid injection.  Call or go to the ER if you develop a large red swollen joint with extreme pain or oozing puss.    Follow-up: as needed

## 2021-11-21 ENCOUNTER — Ambulatory Visit (INDEPENDENT_AMBULATORY_CARE_PROVIDER_SITE_OTHER): Payer: Medicare Other

## 2021-11-21 DIAGNOSIS — I10 Essential (primary) hypertension: Secondary | ICD-10-CM

## 2021-11-21 DIAGNOSIS — M1712 Unilateral primary osteoarthritis, left knee: Secondary | ICD-10-CM

## 2021-11-21 DIAGNOSIS — E7800 Pure hypercholesterolemia, unspecified: Secondary | ICD-10-CM

## 2021-11-21 NOTE — Patient Instructions (Signed)
Visit Information  Following are the goals we discussed today:   Manage My Medicine   Timeframe:  Long-Range Goal Priority:  High Start Date:  11/21/2021                      Expected End Date: 11/21/2022                      Follow Up Date 05/21/2022   - call for medicine refill 2 or 3 days before it runs out - call if I am sick and can't take my medicine - keep a list of all the medicines I take; vitamins and herbals too - learn to read medicine labels - use a pillbox to sort medicine    Why is this important?   These steps will help you keep on track with your medicines.  Plan: Telephone follow up appointment with care management team member scheduled for:  6 months  The patient has been provided with contact information for the care management team and has been advised to call with any health related questions or concerns.   Tomasa Blase, PharmD Clinical Pharmacist, [facility]  Please call the care guide team at [phone] if you need to cancel or reschedule your appointment.   The patient verbalized understanding of instructions, educational materials, and care plan provided today and agreed to receive a mailed copy of patient instructions, educational materials, and care plan.

## 2021-11-21 NOTE — Progress Notes (Signed)
Chronic Care Management Pharmacy Note  11/21/2021 Name:  [name] MRN:  [id number] DOB:  February 20, 1951  Summary: -Patient reports adherence to current medications, denies any issues or concerns with current medications  -Following with sports medicine about chronic knee pain, recently received steroid injection - has follow up in 2 months to discuss status of knee pain / possible referral to ortho about knee replacement  -Continues to follow with [facility] to monitor glaucoma   Recommendations/Changes made from today's visit: -Recommending no changes to medications, advised for patient to use IBU and tramadol prn for pain management  -Discussed about only using flonase prn due to possible increase in occular pressures - will reach out should allergies become uncontrolled  -Advised for low cholesterol diet / increase in activity to help lower cholesterol   Plan: F/u in 6 months   Subjective: Jennifer Huang is an 71 y.o. year old female who is a primary patient of Jennifer Koch, MD.  The CCM team was consulted for assistance with disease management and care coordination needs.    Engaged with patient by telephone for follow up visit in response to provider referral for pharmacy case management and/or care coordination services.   Consent to Services:  The patient was given the following information about Chronic Care Management services today, agreed to services, and gave verbal consent: 1. CCM service includes personalized support from designated clinical staff supervised by the primary care provider, including individualized plan of care and coordination with other care providers 2. 24/7 contact phone numbers for assistance for urgent and routine care needs. 3. Service will only be billed when office clinical staff spend 20 minutes or more in a month to coordinate care. 4. Only one practitioner may furnish and bill the service in a calendar month. 5.The patient may  stop CCM services at any time (effective at the end of the month) by phone call to the office staff. 6. The patient will be responsible for cost sharing (co-pay) of up to 20% of the service fee (after annual deductible is met). Patient agreed to services and consent obtained.  Patient Care Team: Jennifer Koch, MD as PCP - General (Internal Medicine) Lomax, Marny Lowenstein, MD (Inactive) (Obstetrics and Gynecology) Marylynn Pearson, MD (Ophthalmology) Mosetta Anis, MD (Allergy) Rulon Sera, MD (Rehabilitation) Leta Baptist, MD as Consulting Physician (Otolaryngology) Delsa Sale Sudie Grumbling, MD as Referring Physician (Ophthalmology) Tomasa Blase, [facility] (Pharmacist)  Recent office visits: 10/04/2021 - Dr. Sharlet Salina - no changes to medications - f/u in 1 year   Recent consult visits: 11/17/2021 - Dr. Georgina Snell - Sports Medicine - L knee pain - steroid injection given - should steroid injections be unsuccessful - plan would be to refer to ortho - possible TKR 10/18/2021 - Dr. Georgina Snell - Sports Medicine - L knee pain - MRI ordered - recommended IBU prn  10/12/2021 - Dr. Georgina Snell - Sports Medicine - L knee pain - synvisc-One injection  09/08/2021 - Dr. Delsa Sale - [facility]  - start refresh in [facility] visits: None in previous 6 months  Objective:  Lab Results  Component Value Date   CREATININE 0.72 10/04/2021   BUN 22 10/04/2021   GFR 84.50 10/04/2021   GFRNONAA 51 (L) 09/04/2019   GFRAA 59 (L) 09/04/2019   NA 138 10/04/2021   K 4.1 10/04/2021   CALCIUM 9.8 10/04/2021   CO2 31 10/04/2021   GLUCOSE 125 (H) 10/04/2021    Lab Results  Component Value Date/Time   HGBA1C 6.2 07/25/2018 10:39 AM   HGBA1C 6.3 01/22/2018 01:54 PM   GFR 84.50 10/04/2021 10:27 AM   GFR 95.99 07/25/2018 10:39 AM    Last diabetic Eye exam:  No results found for: HMDIABEYEEXA  Last diabetic Foot exam:  No results found for: HMDIABFOOTEX   Lab Results  Component Value Date   CHOL 223 (H) 10/04/2021    HDL 83.20 10/04/2021   LDLCALC 129 (H) 10/04/2021   LDLDIRECT 129.6 10/17/2012   TRIG 53.0 10/04/2021   CHOLHDL 3 10/04/2021    Hepatic Function Latest Ref Rng & Units 10/04/2021 05/31/2020 07/25/2018  Total Protein 6.0 - 8.3 g/dL 6.8 6.8 7.4  Albumin 3.5 - 5.2 g/dL 4.2 - 4.5  AST 0 - 37 U/L _0 ALT 0 - 35 U/L _1 Alk Phosphatase 39 - 117 U/L 54 - 60  Total Bilirubin 0.2 - 1.2 mg/dL 0.8 0.6 0.8  Bilirubin, Direct 0.0 - 0.3 mg/dL - - -    Lab Results  Component Value Date/Time   TSH 1.07 10/04/2021 10:27 AM   TSH 8.44 (H) 05/31/2020 02:40 PM   FREET4 0.91 10/04/2021 10:27 AM   FREET4 0.90 09/28/2020 01:56 PM    CBC Latest Ref Rng & Units 10/04/2021 05/31/2020 07/25/2018  WBC 4.0 - 10.5 K/uL 6.7 6.4 7.3  Hemoglobin 12.0 - 15.0 g/dL 11.9(L) 11.9 12.6  Hematocrit 36.0 - 46.0 % 36.8 36.8 38.1  Platelets 150.0 - 400.0 K/uL 257.0 261 283.0    Lab Results  Component Value Date/Time   VD25OH 30 05/31/2020 02:40 PM   VD25OH 23.87 (L) 01/22/2018 01:54 PM    Clinical ASCVD: No  The 10-year ASCVD risk score (Arnett DK, et al., 2019) is: 9.3%   Values used to calculate the score:     Age: 9 years     Sex: Female     Is Non-Hispanic African American: Yes     Diabetic: No     Tobacco smoker: No     Systolic Blood Pressure: 916 mmHg     Is BP treated: Yes     HDL Cholesterol: 83.2 mg/dL     Total Cholesterol: 223 mg/dL    Depression screen [facility] 2/9 10/04/2021 09/28/2020 07/28/2019  Decreased Interest 0 0 0  Down, Depressed, Hopeless 0 0 0  PHQ - 2 Score 0 0 0  Altered sleeping - - -  Tired, decreased energy - - -  Change in appetite - - -  Feeling bad or failure about yourself  - - -  Trouble concentrating - - -  Moving slowly or fidgety/restless - - -  Suicidal thoughts - - -  PHQ-9 Score - - -  Difficult doing work/chores - - -   Social History   Tobacco Use  Smoking Status Never  Smokeless Tobacco Never   BP Readings from Last 3 Encounters:  11/17/21  104/62  10/18/21 132/86  10/04/21 118/70   Pulse Readings from Last 3 Encounters:  11/17/21 65  10/18/21 63  10/04/21 77   Wt Readings from Last 3 Encounters:  11/17/21 166 lb (75.3 kg)  10/18/21 162 lb 6.4 oz (73.7 kg)  10/04/21 157 lb 12.8 oz (71.6 kg)   BMI Readings from Last 3 Encounters:  11/17/21 26.79 kg/m  10/18/21 26.21 kg/m  10/04/21 25.47 kg/m    Assessment/Interventions: Review of patient past medical history, allergies, medications, health status, including review of consultants reports, laboratory and other test  data, was performed as part of comprehensive evaluation and provision of chronic care management services.   SDOH:  (Social Determinants of Health) assessments and interventions performed: Yes  SDOH Screenings   Alcohol Screen: Low Risk    Last Alcohol Screening Score (AUDIT): 0  Depression (PHQ2-9): Low Risk    PHQ-2 Score: 0  Financial Resource Strain: Low Risk    Difficulty of Paying Living Expenses: Not hard at all  Food Insecurity: No Food Insecurity   Worried About Charity fundraiser in the Last Year: Never true   Ran Out of Food in the Last Year: Never true  Housing: Low Risk    Last Housing Risk Score: 0  Physical Activity: Sufficiently Active   Days of Exercise per Week: 5 days   Minutes of Exercise per Session: 30 min  Social Connections: Moderately Integrated   Frequency of Communication with Friends and Family: More than three times a week   Frequency of Social Gatherings with Friends and Family: Once a week   Attends Religious Services: More than 4 times per year   Active Member of [employer] or Organizations: Yes   Attends Archivist Meetings: More than 4 times per year   Marital Status: Widowed  Stress: No Stress Concern Present   Feeling of Stress : Not at all  Tobacco Use: Low Risk    Smoking Tobacco Use: Never   Smokeless Tobacco Use: Never   Passive Exposure: Not on file  Transportation Needs: Unknown   Lack of  Transportation (Medical): No   Lack of Transportation (Non-Medical): Not on file    CCM Care Plan  Allergies  Allergen Reactions   Penicillins     REACTION: rash/hives   Prednisone     Other reaction(s): Elevates Eye Pressure   Sulfonamide Derivatives     REACTION: rash/hives    Medications Reviewed Today     Reviewed by Tomasa Blase, [facility] (Pharmacist) on 11/21/21 at 1044  Med List Status: <None>   Medication Order Taking? Sig Documenting Provider Last Dose Status Informant  AMBULATORY NON FORMULARY MEDICATION [id number] Yes Orthotic right shoe to correct leg length difference. Disp 1 or a pair.  Fax to [employer] prosthesis [place] M21.70 Gregor Hams, MD Taking Active   amLODipine (NORVASC) 10 MG tablet [id number] Yes Take 1 tablet (10 mg total) by mouth daily. Jennifer Koch, MD Taking Active   Ascorbic Acid (VITAMIN C PO) [id number] Yes Take 250 mg by mouth. [provider] Taking Active   aspirin EC 81 MG tablet [id number] Yes Take 81 mg by mouth daily. Swallow whole.  [provider] Taking Active   Azelastine HCl 0.15 % SOLN [id number] Yes Place 2 sprays into the nose every 12 (twelve) hours as needed. Jennifer Koch, MD Taking Active   brimonidine (ALPHAGAN P) 0.1 % SOLN [id number] Yes 3 (three) times daily. [provider] Taking Active   CALCIUM PO [id number] Yes Take 600 mg by mouth daily. [provider] Taking Active   colchicine 0.6 MG tablet [id number] Yes TAKE 1 TABLET (0.6 MG TOTAL) BY MOUTH DAILY AS NEEDED (GOUT PAIN). Gregor Hams, MD Taking Active   cyclobenzaprine (FLEXERIL) 5 MG tablet [id number] No Take 1 tablet (5 mg total) by mouth 3 (three) times daily as needed for muscle spasms.  Patient not taking: Reported on 11/21/2021   Jennifer Koch, MD Not Taking Active   diclofenac Sodium (VOLTAREN) 1 % GEL [id number] Yes Apply 2 g topically 4 (  four) times daily as needed.  [provider] Taking Active    dorzolamide-timolol (COSOPT) 22.3-6.8 MG/ML ophthalmic solution [id number] Yes 1 drop 2 (two) times daily. [provider] Taking Active   EPINEPHrine 0.3 mg/0.3 mL IJ SOAJ injection [id number]  Inject 0.3 mg into the muscle as needed for anaphylaxis. GENERIC TWINJECT PREFERRED  [provider]  Active   fexofenadine (ALLEGRA) 180 MG tablet [id number] Yes Take 180 mg by mouth daily. [provider] Taking Active   fluticasone (FLONASE) 50 MCG/ACT nasal spray [id number] Yes Place 2 sprays into both nostrils daily. [provider] Taking Active   furosemide (LASIX) 20 MG tablet [id number] Yes TAKE 1 TABLET BY MOUTH  DAILY IF NEEDED Jennifer Koch, MD Taking Active   ibuprofen (ADVIL) 600 MG tablet [id number] Yes Take 1 tablet (600 mg total) by mouth every 8 (eight) hours as needed. Gregor Hams, MD Taking Active   levothyroxine (SYNTHROID) 50 MCG tablet [id number] Yes Take 50 mcg by mouth daily. [provider] Taking Active   lidocaine (LIDODERM) 5 % [id number] Yes Place 1 patch onto the skin daily. Remove & Discard patch within 12 hours or as directed by MD Jennifer Koch, MD Taking Active   Multiple Vitamin (MULTIVITAMIN) tablet [id number] Yes Take 1 tablet by mouth daily. [provider] Taking Active   Multiple Vitamins-Minerals (HAIR SKIN AND NAILS FORMULA PO) [id number] Yes Take 1 tablet by mouth daily. [provider] Taking Active   Olopatadine HCl 0.2 % SOLN [id number] Yes Apply 1 drop to eye daily. [provider] Taking Active   ROCKLATAN 0.02-0.005 % SOLN [id number] Yes  [provider] Taking Active   TART CHERRY PO [id number] Yes Take 1 tablet by mouth daily.  [provider] Taking Active   traMADol (ULTRAM) 50 MG tablet [id number] Yes Take 1 tablet (50 mg total) by mouth daily as needed for severe pain. Jennifer Koch, MD Taking Active   vitamin B-12 (CYANOCOBALAMIN) 1000 MCG tablet  [id number] Yes Take 100 mcg by mouth daily. [provider] Taking Active   VITAMIN D PO [id number] Yes Take 1,000 Units by mouth daily. 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   Degenerative arthritis of left knee 05/29/2019   Atypical mole 01/22/2018   DDD (degenerative disc disease), lumbar 01/10/2017   Arthritis of left acromioclavicular joint 11/07/2016   Goiter 03/17/2014   Routine health maintenance 10/19/2012   Elevated LDL cholesterol level 10/17/2012   Gout, unspecified 09/22/2008   Unspecified glaucoma 09/22/2008   Essential hypertension 07/12/2007    Immunization History  Administered Date(s) Administered   Fluad Quad(high Dose 65+) 07/28/2019, 07/05/2020, 08/17/2021   Influenza Whole 11/09/2009   Influenza, High Dose Seasonal PF 08/31/2016, 07/24/2017, 07/25/2018   Influenza, Seasonal, Injecte, Preservative Fre 10/17/2012   Influenza,inj,Quad PF,6+ Mos 07/10/2014   Moderna Covid-19 Vaccine Bivalent Booster 24yr & up 09/05/2021   Moderna Sars-Covid-2 Vaccination 12/05/2019, 01/05/2020, 08/17/2020   PNEUMOCOCCAL CONJUGATE-20 05/30/2021   Pneumococcal Conjugate-13 07/24/2017   Pneumococcal Polysaccharide-23 03/28/2016   Tetanus 10/17/2012    Conditions to be addressed/monitored:  Hypertension, Hyperlipidemia, Heart Failure, and Osteopenia  Care Plan : [place] Updates [facility]  since 11/21/2021 12:00 AM     Problem: Hypertension, Hyperlipidemia, Osteopenia, Chronic Knee and Back Pain, and Hypothyroidism   Priority: High  Onset Date: 11/21/2021     Long-Range Goal: Disease Management   Start Date: 11/21/2021  Expected End Date: 11/21/2022  This Visit's Progress: On track  Priority: High   Note:   Current Barriers:  Hypertension, Hyperlipidemia, Osteopenia, Chronic Knee and Back Pain, and Hypothyroidism    Hypertension / Heart Failure Controlled  BP Readings from Last 3 Encounters:  11/17/21 104/62  10/18/21 132/86  10/04/21 118/70  Pharmacist Clinical Goal(s): Patient will work with PharmD and providers to maintain BP goal <130/80 Current regimen:  Amodipine 10 mg daily Furosemide 20 mg daily PRN Interventions: Discussed BP goals and benefits of medications for prevention of heart attack / stroke Patient self care activities - patient will: Check BP as needed, document, and provide at future appointments Ensure daily salt intake < 2300 mg/day  Hyperlipidemia Not Ideally Controlled  Lab Results  Component Value Date   LDLCALC 129 (H) 10/04/2021  Pharmacist Clinical Goal(s): Patient will work with PharmD and providers to achieve LDL goal < 100 Current regimen:  ASA 30m daily  Interventions: Discussed cholesterol goals and benefits of healthy lifestyle for prevention of heart attack / stroke Patient self care activities - Patient will: Continue low cholesterol diet Try yoga a few times a week  Should LDL remain elevated despite changes in lifestyle, would be reasonable to start low-moderate intensity statin therapy   Osteopenia Controlled  Pharmacist Clinical Goal(s) Patient will work with PharmD and providers to optimize therapy -Last DEXA Scan: 07/28/2019   T-Score femoral neck: -1.6  T-Score left femoral neck: -1.4  10-year probability of major osteoporotic fracture: 6.9%  10-year probability of hip fracture: 0.8%  Current regimen:  Calcium 6046mdaily  Vitamin D 1000units daily  Interventions: Advised to move calcium to bedtime to avoid interaction with levothyroxine Patient self care activities - Patient will: Continue medications as directed  Chronic Knee and Back Pain  Controlled  Pharmacist Clinical Goal(s) Patient will work with  PharmD and providers to optimize therapy Current regimen:  Ibuprofen 60041m 1 tablet every 8 hours as needed  Lidocaine 5% patch - 1 patch daily  Voltaren 1% gel Tramadol 70m53m1 tablet daily prn  Cyclobenzaprine 5mg 12m tablet 3 times daily prn (not using at this time Interventions: Advised to use IBU and tramadol only as needed - encouraged use of ice and rest for knee pain - continue follow up with sports medicine  Patient self care activities - Patient will: Continue medications as directed  Hypothyroidism Controlled  Lab Results  Component Value Date   TSH 1.07 10/04/2021  Pharmacist Clinical Goal(s) Patient will work with PharmD and providers to optimize therapy / maintain euthyroid levels Current regimen:  Levothyroxine 70mcg73m tablet daily  Interventions: Advised to continue medication as prescribed  Patient self care activities - Patient will: Continue medications as directed  Medication management Pharmacist Clinical Goal(s): Patient will work with PharmD and providers to maintain optimal medication adherence Current pharmacy: [facility] mail order Interventions Comprehensive medication review performed. Continue current medication management strategy Patient self care activities - Patient will: Focus on medication adherence by fill date Take medications as prescribed Report any questions or concerns to PharmD and/or provider(s)       Medication Assistance: None required.  Patient affirms current coverage meets needs.  Care Gaps: Shingrix   Patient's preferred pharmacy is:  [facility], [place] [address] Phone: [phone] Fax: [phone]  [facility] [phone] - [address], [address] AT [place] [address] Phone: [phone] Fax: [phone]   Uses pill box?  Yes Pt endorses 100% compliance  Care Plan and Follow Up Patient Decision:  Patient agrees to Care Plan and Follow-up.  Plan: Telephone follow up appointment with care management team member scheduled for:  6 months  The patient has been provided with contact information for the care management team and has been advised to call with any health related questions or concerns.   Tomasa Blase, PharmD Clinical Pharmacist, [place]

## 2021-11-22 DIAGNOSIS — I10 Essential (primary) hypertension: Secondary | ICD-10-CM

## 2021-11-22 DIAGNOSIS — E7800 Pure hypercholesterolemia, unspecified: Secondary | ICD-10-CM

## 2021-11-22 DIAGNOSIS — M1712 Unilateral primary osteoarthritis, left knee: Secondary | ICD-10-CM | POA: Diagnosis not present

## 2021-11-28 ENCOUNTER — Encounter: Payer: Self-pay | Admitting: Internal Medicine

## 2021-11-28 ENCOUNTER — Ambulatory Visit (INDEPENDENT_AMBULATORY_CARE_PROVIDER_SITE_OTHER): Payer: Medicare Other | Admitting: Internal Medicine

## 2021-11-28 ENCOUNTER — Other Ambulatory Visit: Payer: Self-pay

## 2021-11-28 DIAGNOSIS — J301 Allergic rhinitis due to pollen: Secondary | ICD-10-CM | POA: Diagnosis not present

## 2021-11-28 MED ORDER — IPRATROPIUM BROMIDE 0.06 % NA SOLN: 2.0000 | Freq: Four times a day (QID) | NASAL | 12 refills | Status: DC

## 2021-11-28 NOTE — Assessment & Plan Note (Signed)
Rx atrovent nasal spray as this is safe with her glaucoma. She wants to stop flonase due to glaucoma and only 1 remaining eye. Advised to consider switch from allegra to zyrtec or claritin to see if this will help also.

## 2021-11-28 NOTE — Progress Notes (Signed)
° °  Subjective:   Patient ID: Jennifer Huang, female    DOB: 1951/03/25, 71 y.o.   MRN: [id number]  HPI The patient is a 71 YO female coming in for ear pressure since last week. Also with sinus pressure. Is scared to take her flonase due to causing problems with glaucoma and she has glaucoma taking 3-4 meds only eye and does not want to lose vision.   Review of Systems  Constitutional: Negative.   HENT:  Positive for congestion, ear pain and sinus pressure. Negative for sinus pain.   Eyes: Negative.   Respiratory:  Negative for cough, chest tightness and shortness of breath.   Cardiovascular:  Negative for chest pain, palpitations and leg swelling.  Gastrointestinal:  Negative for abdominal distention, abdominal pain, constipation, diarrhea, nausea and vomiting.  Musculoskeletal: Negative.   Skin: Negative.   Neurological: Negative.   Psychiatric/Behavioral: Negative.     Objective:  Physical Exam Constitutional:      Appearance: She is well-developed.  HENT:     Head: Normocephalic and atraumatic.     Right Ear: Ear canal normal.     Left Ear: Ear canal normal.     Ears:     Comments: Mildly bulging TM bilateral, mild sinus pressure frontal Cardiovascular:     Rate and Rhythm: Normal rate and regular rhythm.  Pulmonary:     Effort: Pulmonary effort is normal. No respiratory distress.     Breath sounds: Normal breath sounds. No wheezing or rales.  Abdominal:     General: Bowel sounds are normal. There is no distension.     Palpations: Abdomen is soft.     Tenderness: There is no abdominal tenderness. There is no rebound.  Musculoskeletal:     Cervical back: Normal range of motion.  Skin:    General: Skin is warm and dry.  Neurological:     Mental Status: She is alert and oriented to person, place, and time.     Coordination: Coordination normal.    Vitals:   11/28/21 0918  BP: 122/62  Pulse: 64  Resp: 18  SpO2: 96%  Weight: 157 lb 12.8 oz (71.6 kg)  Height: 5\' 6"   (1.676 m)    This visit occurred during the SARS-CoV-2 public health emergency.  Safety protocols were in place, including screening questions prior to the visit, additional usage of staff PPE, and extensive cleaning of exam room while observing appropriate contact time as indicated for disinfecting solutions.   Assessment & Plan:

## 2021-11-28 NOTE — Patient Instructions (Addendum)
We have sent in nose spray atrovent to use up to 4 times a day as needed. This works when taken so you can use it only when you need it.

## 2021-11-29 ENCOUNTER — Telehealth: Payer: Self-pay

## 2021-11-29 MED ORDER — IPRATROPIUM BROMIDE 0.06 % NA SOLN: 2.0000 | Freq: Four times a day (QID) | NASAL | 12 refills | Status: AC

## 2021-11-29 NOTE — Telephone Encounter (Signed)
Refill has been sent to the pt's local pharmacy.

## 2021-11-29 NOTE — Telephone Encounter (Signed)
Pt is asking for  ipratropium (ATROVENT) 0.06 % nasal spray to be sent to her local pharmacy since it takes so long to receive the Rx.  Pharmacy: [facility] [place], [place] - [phone] AT [place]: 11/28/21

## 2021-12-12 ENCOUNTER — Telehealth: Payer: Self-pay

## 2021-12-12 DIAGNOSIS — M5440 Lumbago with sciatica, unspecified side: Secondary | ICD-10-CM | POA: Diagnosis not present

## 2021-12-12 NOTE — Chronic Care Management (AMB) (Signed)
Chronic Care Management Pharmacy Assistant   Name: Jennifer Huang  MRN: [id number] DOB: Apr 10, 1951  Jennifer Huang is an 71 y.o. year old female who presents for his follow-up CCM visit with the clinical pharmacist.  Reason for Encounter: Disease State   Conditions to be addressed/monitored: HTN  Recent office visits:  11/28/21 Hoyt Koch, MD-PCP (Pressure in R Ear & Balance Concerns) No orders, med changes: atrovent to use up to 4 times a day as needed  Recent consult visits:  None ID  Hospital visits:  None since last coordination call  Medications: Outpatient Encounter Medications as of 12/12/2021  Medication Sig   AMBULATORY NON FORMULARY MEDICATION Orthotic right shoe to correct leg length difference. Disp 1 or a pair.  Fax to [employer] prosthesis [place] M21.70   amLODipine (NORVASC) 10 MG tablet Take 1 tablet (10 mg total) by mouth daily.   Ascorbic Acid (VITAMIN C PO) Take 250 mg by mouth.   aspirin EC 81 MG tablet Take 81 mg by mouth daily. Swallow whole.    Azelastine HCl 0.15 % SOLN Place 2 sprays into the nose every 12 (twelve) hours as needed.   brimonidine (ALPHAGAN P) 0.1 % SOLN 3 (three) times daily.   CALCIUM PO Take 600 mg by mouth daily.   colchicine 0.6 MG tablet TAKE 1 TABLET (0.6 MG TOTAL) BY MOUTH DAILY AS NEEDED (GOUT PAIN).   cyclobenzaprine (FLEXERIL) 5 MG tablet Take 1 tablet (5 mg total) by mouth 3 (three) times daily as needed for muscle spasms. (Patient not taking: Reported on 11/21/2021)   diclofenac Sodium (VOLTAREN) 1 % GEL Apply 2 g topically 4 (four) times daily as needed.    dorzolamide-timolol (COSOPT) 22.3-6.8 MG/ML ophthalmic solution 1 drop 2 (two) times daily.   EPINEPHrine 0.3 mg/0.3 mL IJ SOAJ injection Inject 0.3 mg into the muscle as needed for anaphylaxis. GENERIC TWINJECT PREFERRED    fexofenadine (ALLEGRA) 180 MG tablet Take 180 mg by mouth daily.   fluticasone (FLONASE) 50 MCG/ACT nasal spray Place 2 sprays into  both nostrils daily.   furosemide (LASIX) 20 MG tablet TAKE 1 TABLET BY MOUTH  DAILY IF NEEDED   ibuprofen (ADVIL) 600 MG tablet Take 1 tablet (600 mg total) by mouth every 8 (eight) hours as needed.   ipratropium (ATROVENT) 0.06 % nasal spray Place 2 sprays into both nostrils 4 (four) times daily.   levothyroxine (SYNTHROID) 50 MCG tablet Take 50 mcg by mouth daily.   lidocaine (LIDODERM) 5 % Place 1 patch onto the skin daily. Remove & Discard patch within 12 hours or as directed by MD   Multiple Vitamin (MULTIVITAMIN) tablet Take 1 tablet by mouth daily.   Multiple Vitamins-Minerals (HAIR SKIN AND NAILS FORMULA PO) Take 1 tablet by mouth daily.   Olopatadine HCl 0.2 % SOLN Apply 1 drop to eye daily.   ROCKLATAN 0.02-0.005 % SOLN    TART CHERRY PO Take 1 tablet by mouth daily.    traMADol (ULTRAM) 50 MG tablet Take 1 tablet (50 mg total) by mouth daily as needed for severe pain.   vitamin B-12 (CYANOCOBALAMIN) 1000 MCG tablet Take 100 mcg by mouth daily.   VITAMIN D PO Take 1,000 Units by mouth daily.   No facility-administered encounter medications on file as of 12/12/2021.    Recent Office Vitals: BP Readings from Last 3 Encounters:  11/28/21 122/62  11/17/21 104/62  10/18/21 132/86   Pulse Readings from Last 3 Encounters:  11/28/21 64  11/17/21 65  10/18/21 63    Wt Readings from Last 3 Encounters:  11/28/21 157 lb 12.8 oz (71.6 kg)  11/17/21 166 lb (75.3 kg)  10/18/21 162 lb 6.4 oz (73.7 kg)     Kidney Function Lab Results  Component Value Date/Time   CREATININE 0.72 10/04/2021 10:27 AM   CREATININE 0.87 05/31/2020 02:40 PM   CREATININE 1.11 (H) 09/04/2019 03:00 PM   GFR 84.50 10/04/2021 10:27 AM   GFRNONAA 51 (L) 09/04/2019 03:00 PM   GFRAA 59 (L) 09/04/2019 03:00 PM    BMP Latest Ref Rng & Units 10/04/2021 05/31/2020 09/04/2019  Glucose 70 - 99 mg/dL 125(H) 90 154(H)  BUN 6 - 23 mg/dL 22 20 12   Creatinine 0.40 - 1.20 mg/dL 0.72 0.87 1.11(H)  BUN/Creat Ratio 6 -  22 (calc) - NOT APPLICABLE -  Sodium 916 - 145 mEq/L 138 140 139  Potassium 3.5 - 5.1 mEq/L 4.1 4.0 4.5  Chloride 96 - 112 mEq/L 102 103 102  CO2 19 - 32 mEq/L 31 28 27   Calcium 8.4 - 10.5 mg/dL 9.8 10.2 9.5   Reviewed chart prior to disease state call. Spoke with patient regarding BP  Current antihypertensive regimen:  Amodipine 10 mg daily Furosemide 20 mg daily PRN  How often are you checking your Blood Pressure?  Patient states that she does not check blood pressure at home but usually waits until she has doctors appt  Current home BP readings: Patient states that she had an appt yesterday in [place] for back pain and her blood pressure was 143/70  What recent interventions/DTPs have been made by any provider to improve Blood Pressure control since last CPP Visit: None ID  Any recent hospitalizations or ED visits since last visit with CPP? No  What diet changes have been made to improve Blood Pressure Control?  Patient states that she does watch what she eats but has not made any new changes to her diet  What exercise is being done to improve your Blood Pressure Control?  Patient stays active around the house when she can, due to knee pain  Adherence Review: Is the patient currently on ACE/ARB medication? No Does the patient have >5 day gap between last estimated fill dates? No     Care Gaps: Colonoscopy-09/22/20 Diabetic Foot Exam-NA Mammogram-10/13/21 Ophthalmology-NA Dexa Scan - NA Annual Well Visit - NA Micro albumin-NA Hemoglobin A1c- NA  Star Rating Drugs: None ID  Ethelene Hal Clinical Pharmacist Assistant [phone]

## 2021-12-15 ENCOUNTER — Ambulatory Visit (INDEPENDENT_AMBULATORY_CARE_PROVIDER_SITE_OTHER): Payer: Medicare Other

## 2021-12-15 ENCOUNTER — Ambulatory Visit: Payer: Medicare Other | Admitting: Family Medicine

## 2021-12-15 ENCOUNTER — Other Ambulatory Visit: Payer: Self-pay

## 2021-12-15 ENCOUNTER — Encounter: Payer: Self-pay | Admitting: Family Medicine

## 2021-12-15 VITALS — BP 102/68 | HR 60 | Ht 66.0 in | Wt 163.4 lb

## 2021-12-15 DIAGNOSIS — M79675 Pain in left toe(s): Secondary | ICD-10-CM

## 2021-12-15 NOTE — Progress Notes (Signed)
° °  I, Molly Weber, LAT, ATC, am serving as scribe for Dr. Evan Corey. ° °Yanisa J Caulder is a 71 y.o. female who presents to [facility] today for L Great toe pain. Pt was previously seen by Dr. Corey on 11/17/21 for chronic L knee pain. Today, pt c/o L Great toe pain intermittently that worsened this morning. Pt locates pain to the plantar aspect of her L 1st MTPJ.  She has a hx of gout. ° °Swelling: no °Aggravates: weight-bearing activity °Treatments tried: Tylenol arthritis ° °Dx testing:10/04/21 Labs (uric acid (=4.8mg/dL), T4, TSH, CBC, comp met panel, lipid panel) °07/05/20 L ankle XR ° °Pertinent review of systems: No fevers or chills ° °Relevant historical information: Questionable history of gout. ° ° °Exam:  °BP 102/68 (BP Location: Left Arm, Patient Position: Sitting, Cuff Size: Normal)    Pulse 60    Ht 5' 6" (1.676 m)    Wt 163 lb 6.4 oz (74.1 kg)    SpO2 95%    BMI 26.37 kg/m²  °General: Well Developed, well nourished, and in no acute distress.  ° °MSK: Left foot bunion formation.  Callus formation at the plantar aspect of her first toe otherwise normal-appearing °First toe is not particularly painful to palpation at dorsal or medial MTP.  She has painful to palpation at the plantar aspect of her MTP and IP joint °Slight reduced range of motion at MTP to plantarflexion and dorsiflexion. ° ° ° °Lab and Radiology Results ° °X-ray images left great toe obtained today personally and independently interpreted °Mild DJD.  No acute fractures. °Await formal radiology review ° ° ° °Assessment and Plan: °71 y.o. female with left great toe pain without injury.  I think the main source of pain is sesamoiditis or metatarsalgia or may be hallux rigidus.  Fundamentally would like to treat with metatarsal pads her first ray float.  However she comes today wearing flip-flops so is hard to modify her shoes appropriately.  I showed her how to use metatarsal pads in her flip-flops and  she will try her best at home with her regular shoes and her insoles.  Additionally will use some Voltaren gel and check back in a month.  When she turns in 1 month I think it is helpful if she could come back with her orthotics/insoles so I can modify those appropriately. ° ° °PDMP not reviewed this encounter. °Orders Placed This Encounter  °Procedures  ° DG Toe Great Left  °  Standing Status:   Future  °  Number of Occurrences:   1  °  Standing Expiration Date:   01/12/2022  °  Order Specific Question:   Reason for Exam (SYMPTOM  OR DIAGNOSIS REQUIRED)  °  Answer:   L great toe pain  °  Order Specific Question:   Preferred imaging location?  °  Answer:   [place] [facility]  ° °No orders of the defined types were placed in this encounter. ° ° ° °Discussed warning signs or symptoms. Please see discharge instructions. Patient expresses understanding. ° °The above documentation has been reviewed and is accurate and complete Evan Corey, M.D. ° ° °

## 2021-12-15 NOTE — Patient Instructions (Addendum)
Good to see you today.  Please use Voltaren gel (Generic Diclofenac Gel) up to 4x daily for pain as needed.  This is available over-the-counter as both the name brand Voltaren gel and the generic diclofenac gel.   Please get an Xray today before you leave.  Follow-up: one month w/ your tennis shoes and insoles

## 2021-12-19 NOTE — Progress Notes (Signed)
Left great toe x-ray looks normal to radiology.

## 2022-01-04 DIAGNOSIS — M5440 Lumbago with sciatica, unspecified side: Secondary | ICD-10-CM | POA: Diagnosis not present

## 2022-01-12 ENCOUNTER — Ambulatory Visit: Payer: Medicare Other | Admitting: Family Medicine

## 2022-01-16 DIAGNOSIS — H401122 Primary open-angle glaucoma, left eye, moderate stage: Secondary | ICD-10-CM | POA: Diagnosis not present

## 2022-01-16 DIAGNOSIS — H1045 Other chronic allergic conjunctivitis: Secondary | ICD-10-CM | POA: Diagnosis not present

## 2022-01-16 DIAGNOSIS — H35372 Puckering of macula, left eye: Secondary | ICD-10-CM | POA: Diagnosis not present

## 2022-01-16 DIAGNOSIS — H2512 Age-related nuclear cataract, left eye: Secondary | ICD-10-CM | POA: Diagnosis not present

## 2022-01-18 ENCOUNTER — Other Ambulatory Visit (HOSPITAL_BASED_OUTPATIENT_CLINIC_OR_DEPARTMENT_OTHER): Payer: Self-pay

## 2022-01-24 ENCOUNTER — Telehealth: Payer: Self-pay

## 2022-01-24 NOTE — Progress Notes (Signed)
? ? ?Chronic Care Management ?Pharmacy Assistant  ? ?Name: Jennifer Huang  MRN: [id number] DOB: July 05, 1951 ? ?Jennifer Huang is an 71 y.o. year old female who was called for her follow-up assessment call.  ? ?Reason for Encounter: Disease State ?  ?Conditions to be addressed/monitored: ?HTN ? ? ?Recent office visits:  ?None ID ? ?Recent consult visits:  ?None ID ? ?Hospital visits:  ?None in previous 6 months ? ?Medications: ?Outpatient Encounter Medications as of 01/24/2022  ?Medication Sig  ? AMBULATORY NON FORMULARY MEDICATION Orthotic right shoe to correct leg length difference. Disp 1 or a pair.  ?Fax to [employer] prosthesis [place] ?M21.70  ? amLODipine (NORVASC) 10 MG tablet Take 1 tablet (10 mg total) by mouth daily.  ? Ascorbic Acid (VITAMIN C PO) Take 250 mg by mouth.  ? aspirin EC 81 MG tablet Take 81 mg by mouth daily. Swallow whole.   ? Azelastine HCl 0.15 % SOLN Place 2 sprays into the nose every 12 (twelve) hours as needed.  ? brimonidine (ALPHAGAN P) 0.1 % SOLN 3 (three) times daily.  ? CALCIUM PO Take 600 mg by mouth daily.  ? colchicine 0.6 MG tablet TAKE 1 TABLET (0.6 MG TOTAL) BY MOUTH DAILY AS NEEDED (GOUT PAIN).  ? cyclobenzaprine (FLEXERIL) 5 MG tablet Take 1 tablet (5 mg total) by mouth 3 (three) times daily as needed for muscle spasms. (Patient not taking: Reported on 11/21/2021)  ? diclofenac Sodium (VOLTAREN) 1 % GEL Apply 2 g topically 4 (four) times daily as needed.   ? dorzolamide-timolol (COSOPT) 22.3-6.8 MG/ML ophthalmic solution 1 drop 2 (two) times daily.  ? EPINEPHrine 0.3 mg/0.3 mL IJ SOAJ injection Inject 0.3 mg into the muscle as needed for anaphylaxis. GENERIC TWINJECT PREFERRED   ? fexofenadine (ALLEGRA) 180 MG tablet Take 180 mg by mouth daily.  ? fluticasone (FLONASE) 50 MCG/ACT nasal spray Place 2 sprays into both nostrils daily.  ? furosemide (LASIX) 20 MG tablet TAKE 1 TABLET BY MOUTH  DAILY IF NEEDED  ? ibuprofen (ADVIL) 600 MG tablet Take 1 tablet (600 mg total) by  mouth every 8 (eight) hours as needed.  ? ipratropium (ATROVENT) 0.06 % nasal spray Place 2 sprays into both nostrils 4 (four) times daily.  ? levothyroxine (SYNTHROID) 50 MCG tablet Take 50 mcg by mouth daily.  ? lidocaine (LIDODERM) 5 % Place 1 patch onto the skin daily. Remove & Discard patch within 12 hours or as directed by MD  ? Multiple Vitamin (MULTIVITAMIN) tablet Take 1 tablet by mouth daily.  ? Multiple Vitamins-Minerals (HAIR SKIN AND NAILS FORMULA PO) Take 1 tablet by mouth daily.  ? Olopatadine HCl 0.2 % SOLN Apply 1 drop to eye daily.  ? ROCKLATAN 0.02-0.005 % SOLN   ? TART CHERRY PO Take 1 tablet by mouth daily.   ? traMADol (ULTRAM) 50 MG tablet Take 1 tablet (50 mg total) by mouth daily as needed for severe pain.  ? vitamin B-12 (CYANOCOBALAMIN) 1000 MCG tablet Take 100 mcg by mouth daily.  ? VITAMIN D PO Take 1,000 Units by mouth daily.  ? ?No facility-administered encounter medications on file as of 01/24/2022.  ? ? ?Recent Office Vitals: ?BP Readings from Last 3 Encounters:  ?12/15/21 102/68  ?11/28/21 122/62  ?11/17/21 104/62  ? ?Pulse Readings from Last 3 Encounters:  ?12/15/21 60  ?11/28/21 64  ?11/17/21 65  ?  ?Wt Readings from Last 3 Encounters:  ?12/15/21 163 lb 6.4 oz (74.1 kg)  ?11/28/21 157 lb  12.8 oz (71.6 kg)  ?11/17/21 166 lb (75.3 kg)  ?  ? ?Kidney Function ?Lab Results  ?Component Value Date/Time  ? CREATININE 0.72 10/04/2021 10:27 AM  ? CREATININE 0.87 05/31/2020 02:40 PM  ? CREATININE 1.11 (H) 09/04/2019 03:00 PM  ? GFR 84.50 10/04/2021 10:27 AM  ? GFRNONAA 51 (L) 09/04/2019 03:00 PM  ? GFRAA 59 (L) 09/04/2019 03:00 PM  ? ? ? ?  Latest Ref Rng & Units 10/04/2021  ? 10:27 AM 05/31/2020  ?  2:40 PM 09/04/2019  ?  3:00 PM  ?BMP  ?Glucose 70 - 99 mg/dL 125   90   154    ?BUN 6 - 23 mg/dL '22   20   12    '$ ?Creatinine 0.40 - 1.20 mg/dL 0.72   0.87   1.11    ?BUN/Creat Ratio 6 - 22 (calc)  NOT APPLICABLE     ?Sodium 135 - 145 mEq/L 138   140   139    ?Potassium 3.5 - 5.1 mEq/L 4.1   4.0    4.5    ?Chloride 96 - 112 mEq/L 102   103   102    ?CO2 19 - 32 mEq/L '31   28   27    '$ ?Calcium 8.4 - 10.5 mg/dL 9.8   10.2   9.5    ? ?Reviewed chart prior to disease state call. Spoke with patient regarding BP ? ?Current antihypertensive regimen:  ?Amodipine 10 mg daily ?Furosemide 20 mg daily PRN ? ?How often are you checking your Blood Pressure?Patient states that she takes when feeling symptomatic or having a headache or allergies. States that she only takes because cholesterol was to high. ? ?Current home BP readings: Last blood pressure reading was 102/68 ? ?What recent interventions/DTPs have been made by any provider to improve Blood Pressure control since last CPP Visit: none noted ? ?Any recent hospitalizations or ED visits since last visit with CPP? No ? ?What diet changes have been made to improve Blood Pressure Control?  ?Patient states that she has not made any new changes to her diet ?What exercise is being done to improve your Blood Pressure Control?  ?Patient states that she does not exercise because she has really bad back pain ? ?Adherence Review: ?Is the patient currently on ACE/ARB medication? No ?Does the patient have >5 day gap between last estimated fill dates? No ? ? ?Care Gaps: ?Colonoscopy-09/22/20 ?Diabetic Foot Exam-NA ?Mammogram-10/13/21 ?Ophthalmology-NA ?Dexa Scan - NA ?Annual Well Visit - NA ?Micro albumin-NA ?Hemoglobin A1c- NA ? ?Star Rating Drugs: ?None ID ? ?Ethelene Hal ?Clinical Pharmacist Assistant ?[phone]  ?

## 2022-02-10 DIAGNOSIS — M5416 Radiculopathy, lumbar region: Secondary | ICD-10-CM | POA: Diagnosis not present

## 2022-02-27 DIAGNOSIS — E039 Hypothyroidism, unspecified: Secondary | ICD-10-CM | POA: Diagnosis not present

## 2022-03-06 DIAGNOSIS — Z9009 Acquired absence of other part of head and neck: Secondary | ICD-10-CM | POA: Diagnosis not present

## 2022-03-06 DIAGNOSIS — E039 Hypothyroidism, unspecified: Secondary | ICD-10-CM | POA: Diagnosis not present

## 2022-03-06 DIAGNOSIS — Z8639 Personal history of other endocrine, nutritional and metabolic disease: Secondary | ICD-10-CM | POA: Diagnosis not present

## 2022-03-06 DIAGNOSIS — E063 Autoimmune thyroiditis: Secondary | ICD-10-CM | POA: Diagnosis not present

## 2022-03-09 ENCOUNTER — Other Ambulatory Visit: Payer: Self-pay | Admitting: Internal Medicine

## 2022-03-09 DIAGNOSIS — I10 Essential (primary) hypertension: Secondary | ICD-10-CM

## 2022-03-13 NOTE — Progress Notes (Unsigned)
   I, [name], LAT, ATC, am serving as scribe for Dr. [name].  Jennifer Huang is a 71 y.o. 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  She was last seen by Dr. Georgina Snell on 12/15/21 for her L great toe and prior to that on 11/17/21 for her L knee, having a L knee steroid injection.  Today, pt reports L knee gets stiff when trying to transition to stand. Pt reports L knee started hurting again around the beginning of April.   Diagnostic testing: L knee MRI- 11/12/21; L knee XR- 08/09/21  Pertinent review of systems: No fevers or chills  Relevant historical information: Hypertension   Exam:  BP 112/72   Pulse 62   Ht '5\' 6"'$  (1.676 m)   Wt 163 lb (73.9 kg)   SpO2 97%   BMI 26.31 kg/m  General: Well Developed, well nourished, and in no acute distress.   MSK: Left knee: Moderate effusion normal motion with crepitation tender palpation medial and lateral joint line.    Lab and Radiology Results  Procedure: Real-time Ultrasound Guided Injection of left knee superior lateral patellar space Device: Philips Affiniti 50G Images permanently stored and available for review in PACS Verbal informed consent obtained.  Discussed risks and benefits of procedure. Warned about infection, bleeding, hyperglycemia damage to structures among others. Patient expresses understanding and agreement Time-out conducted.   Noted no overlying erythema, induration, or other signs of local infection.   Skin prepped in a sterile fashion.   Local anesthesia: Topical Ethyl chloride.   With sterile technique and under real time ultrasound guidance: 40 mg of Kenalog and 2 mL of Marcaine injected into knee joint. Fluid seen entering the joint capsule.   Completed without difficulty   Pain immediately resolved suggesting accurate placement of the medication.   Advised to call if fevers/chills, erythema, induration, drainage, or persistent  bleeding.   Images permanently stored and available for review in the ultrasound unit.  Impression: Technically successful ultrasound guided injection.        Assessment and Plan: 71 y.o. female with knee pain due to exacerbation of DJD.  Plan for steroid injection left knee.  Recheck back as needed.   PDMP not reviewed this encounter. Orders Placed This Encounter  Procedures   [place] LIMITED JOINT SPACE STRUCTURES LOW LEFT(NO LINKED CHARGES)    Order Specific Question:   Reason for Exam (SYMPTOM  OR DIAGNOSIS REQUIRED)    Answer:   L knee pain    Order Specific Question:   Preferred imaging location?    Answer:   [place]   No orders of the defined types were placed in this encounter.    Discussed warning signs or symptoms. Please see discharge instructions. Patient expresses understanding.   The above documentation has been reviewed and is accurate and complete [name], M.D.

## 2022-03-14 ENCOUNTER — Telehealth: Payer: Self-pay | Admitting: Physical Therapy

## 2022-03-14 ENCOUNTER — Ambulatory Visit: Payer: Medicare Other | Admitting: Family Medicine

## 2022-03-14 ENCOUNTER — Ambulatory Visit: Payer: Self-pay

## 2022-03-14 VITALS — BP 112/72 | HR 62 | Ht 66.0 in | Wt 163.0 lb

## 2022-03-14 DIAGNOSIS — M1712 Unilateral primary osteoarthritis, left knee: Secondary | ICD-10-CM

## 2022-03-14 DIAGNOSIS — M25562 Pain in left knee: Secondary | ICD-10-CM

## 2022-03-14 DIAGNOSIS — G8929 Other chronic pain: Secondary | ICD-10-CM

## 2022-03-14 MED ORDER — TRAMADOL HCL 50 MG PO TABS: 50.0000 mg | ORAL_TABLET | Freq: Three times a day (TID) | ORAL | 0 refills | Status: DC | PRN

## 2022-03-14 NOTE — Patient Instructions (Signed)
Thank you for coming in today.   Call or go to the ER if you develop a large red swollen joint with extreme pain or oozing puss.    Recheck as needed.    

## 2022-03-14 NOTE — Telephone Encounter (Signed)
Pt called office and states that her L knee is more painful and swollen after having her knee injection earlier today.  She reports redness at the site of the injection as well as an aching pain.  Advise pt to put ice on her knee for 15 min.  Also advise that I will inform Dr. Georgina Snell of this response and will return call before the end of the day w/ any other recommendations.

## 2022-03-14 NOTE — Telephone Encounter (Signed)
Talk to [facility].  Symptoms sound like a steroid flare.  Will prescribe tramadol.  Update me in 1 to 2 days.

## 2022-03-14 NOTE — Addendum Note (Signed)
Addended by: Gregor Hams on: 03/14/2022 04:17 PM   Modules accepted: Orders

## 2022-03-29 ENCOUNTER — Telehealth: Payer: Self-pay | Admitting: Internal Medicine

## 2022-03-29 NOTE — Telephone Encounter (Signed)
Patient needs to speak with you concerning prescriptions

## 2022-03-29 NOTE — Telephone Encounter (Signed)
Spoke with patient,   Asked about how long she should separate her levothyroxine from her morning medications, advised for her to separate medication 30-60 minutes before her other medications / food   Pt will reach out with any further questions in regards to her medications   Tomasa Blase, PharmD Clinical Pharmacist, [place]

## 2022-04-03 DIAGNOSIS — M5440 Lumbago with sciatica, unspecified side: Secondary | ICD-10-CM | POA: Diagnosis not present

## 2022-05-15 ENCOUNTER — Encounter: Payer: Self-pay | Admitting: Family Medicine

## 2022-05-15 ENCOUNTER — Ambulatory Visit: Payer: Medicare Other | Admitting: Family Medicine

## 2022-05-15 VITALS — BP 110/70 | HR 59 | Ht 66.0 in | Wt 160.6 lb

## 2022-05-15 DIAGNOSIS — M25562 Pain in left knee: Secondary | ICD-10-CM

## 2022-05-15 DIAGNOSIS — H524 Presbyopia: Secondary | ICD-10-CM | POA: Diagnosis not present

## 2022-05-15 DIAGNOSIS — G8929 Other chronic pain: Secondary | ICD-10-CM

## 2022-05-15 MED ORDER — TRAMADOL HCL 50 MG PO TABS: 50.0000 mg | ORAL_TABLET | Freq: Three times a day (TID) | ORAL | 0 refills | Status: DC | PRN

## 2022-05-15 NOTE — Patient Instructions (Addendum)
Good to see you today.  I refilled you Tramadol and it's been sent to your pharmacy.  We are going to work on getting both Zilretta and gel injections authorized for your L knee.  Someone from our office will call you once we hear back from your insurance.  Follow-up: for either gel shots or Zilretta once we hear back from your insurance.  We can do Zilretta injections as soon as one month from now or can do gel shots whenever.

## 2022-05-15 NOTE — Progress Notes (Signed)
I, [name], LAT, ATC, am serving as scribe for Dr. [name].  Jennifer Huang is a 71 y.o. female who presents to [place] at [facility] today for f/u L knee pain due to DJD. Pt was last seen by Dr. Georgina Snell on 03/14/22 and was given a L knee steroid injection. Today, pt reports that her L knee pain returned last week.  She feels like her L knee wants to give-out and she feels like her L knee does want to fully extend.  Dx imaging: 11/12/21 L knee MRI  08/09/21 L knee XR  03/22/21 L knee XR  Pertinent review of systems: No fevers or chills  Relevant historical information: Hypertension.  Glaucoma.   Exam:  BP 110/70 (BP Location: Left Arm, Patient Position: Sitting, Cuff Size: Normal)   Pulse (!) 59   Ht '5\' 6"'$  (1.676 m)   Wt 160 lb 9.6 oz (72.8 kg)   SpO2 95%   BMI 25.92 kg/m  General: Well Developed, well nourished, and in no acute distress.   MSK: Left knee: Mild effusion.  Decreased range of motion.  Tender palpation medial joint line.    Lab and Radiology Results EXAM: MRI OF THE LEFT KNEE WITHOUT CONTRAST   TECHNIQUE: Multiplanar, multisequence MR imaging of the knee was performed. No intravenous contrast was administered.   COMPARISON:  Radiographs 08/09/2021   FINDINGS: MENISCI   Medial meniscus:  Intact   Lateral meniscus:  Intact   LIGAMENTS   Cruciates: The PCL is intact. Severe mucoid degeneration of the ACL.   Collaterals:  Intact   CARTILAGE   Patellofemoral:  Moderate degenerative chondrosis.   Medial: Moderate degenerative chondrosis with early joint space narrowing and spurring.   Lateral: Moderate degenerative chondrosis with early spurring changes.   Joint:  Moderate-sized joint effusion and moderate synovitis.   Popliteal Fossa:  No popliteal mass or Baker's cyst.   Extensor Mechanism: The patella retinacular structures are intact and the quadriceps and patellar tendons are intact.   Bones:  No acute  bony findings. No osteochondral abnormality.   Other: Unremarkable knee musculature.   IMPRESSION: 1. Intact ligamentous structures and no acute bony findings. 2. No meniscal tears. 3. 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 4. Tricompartmental degenerative changes. 5. Moderate-sized joint effusion and moderate synovitis.     Electronically Signed   By: Marijo Sanes M.D.   On: 11/13/2021 12:04   I, [name], personally (independently) visualized and performed the interpretation of the images attached in this note.     Assessment and Plan: 71 y.o. female with left knee pain thought to be due to DJD.  Steroid injection was last performed about 2 months ago.  Unfortunately she is too soon for repeat steroid injection.  She has moderate arthritis seen on her most recent imaging, an MRI for about 6 months ago.  Repeat injection is reasonable at the 53-monthmark.  However since conventional steroid injection did not last longer than 3 months we will try to authorize Zilretta which could last longer.  She has had hyaluronic acid injections in the past which worked moderately well.  We will try to authorize those as well so we can have some options at least. Tramadol and Tylenol for limited pain control in the interim.  PDMP reviewed during this encounter. No orders of the defined types were placed in this encounter.  Meds ordered this encounter  Medications   traMADol (ULTRAM) 50 MG  tablet    Sig: Take 1 tablet (50 mg total) by mouth every 8 (eight) hours as needed for severe pain.    Dispense:  15 tablet    Refill:  0     Discussed warning signs or symptoms. Please see discharge instructions. Patient expresses understanding.   The above documentation has been reviewed and is accurate and complete [name], M.D.

## 2022-05-16 DIAGNOSIS — L509 Urticaria, unspecified: Secondary | ICD-10-CM | POA: Diagnosis not present

## 2022-05-16 DIAGNOSIS — T63441A Toxic effect of venom of bees, accidental (unintentional), initial encounter: Secondary | ICD-10-CM | POA: Diagnosis not present

## 2022-05-22 ENCOUNTER — Telehealth: Payer: Medicare Other

## 2022-05-31 DIAGNOSIS — H1012 Acute atopic conjunctivitis, left eye: Secondary | ICD-10-CM | POA: Diagnosis not present

## 2022-05-31 DIAGNOSIS — H2512 Age-related nuclear cataract, left eye: Secondary | ICD-10-CM | POA: Diagnosis not present

## 2022-05-31 DIAGNOSIS — H35372 Puckering of macula, left eye: Secondary | ICD-10-CM | POA: Diagnosis not present

## 2022-05-31 DIAGNOSIS — H401122 Primary open-angle glaucoma, left eye, moderate stage: Secondary | ICD-10-CM | POA: Diagnosis not present

## 2022-07-10 ENCOUNTER — Other Ambulatory Visit: Payer: Self-pay | Admitting: *Deleted

## 2022-07-10 DIAGNOSIS — E039 Hypothyroidism, unspecified: Secondary | ICD-10-CM

## 2022-07-10 DIAGNOSIS — E890 Postprocedural hypothyroidism: Secondary | ICD-10-CM

## 2022-07-12 ENCOUNTER — Ambulatory Visit
Admission: RE | Admit: 2022-07-12 | Discharge: 2022-07-12 | Disposition: A | Discharge status: Home / Self Care | Payer: Medicare Other | Source: Ambulatory Visit | Attending: Internal Medicine | Admitting: Internal Medicine

## 2022-07-12 DIAGNOSIS — E039 Hypothyroidism, unspecified: Secondary | ICD-10-CM

## 2022-07-12 DIAGNOSIS — E890 Postprocedural hypothyroidism: Secondary | ICD-10-CM

## 2022-07-12 DIAGNOSIS — E041 Nontoxic single thyroid nodule: Secondary | ICD-10-CM | POA: Diagnosis not present

## 2022-07-27 NOTE — Progress Notes (Signed)
   I, Peterson Lombard, LAT, ATC acting as a scribe for [name], MD.  Jennifer Huang is a 71 y.o. 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 Pt was last seen by Dr. Georgina Snell on 05/15/22 and was advised we would work to authorize [employer]. Pt's last L knee steroid injection was on 03/14/22. Today, pt reports knee pain has continued and persisted.  She had about 2 months of relief out of the last steroid injection.  She notes prior hyaluronic acid injections only worked moderately well.  Dx imaging: 11/12/21 L knee MRI             08/09/21 L knee XR             03/22/21 L knee XR  Pertinent review of systems: No fevers or chills  Relevant historical information: Hypertension   Exam:  Wt 160 lb (72.6 kg)   BMI 25.82 kg/m  General: Well Developed, well nourished, and in no acute distress.   MSK: Left knee moderate effusion normal motion with crepitation.  Tender palpation medial joint line.    Lab and Radiology Results  Procedure: Real-time Ultrasound Guided Injection of left knee superior lateral patellar space Device: Philips Affiniti 50G Images permanently stored and available for review in PACS Verbal informed consent obtained.  Discussed risks and benefits of procedure. Warned about infection, bleeding, hyperglycemia damage to structures among others. Patient expresses understanding and agreement Time-out conducted.   Noted no overlying erythema, induration, or other signs of local infection.   Skin prepped in a sterile fashion.   Local anesthesia: Topical Ethyl chloride.   With sterile technique and under real time ultrasound guidance: Zilretta 32 mg injected into the knee joint. Fluid seen entering the joint capsule.   Completed without difficulty   Advised to call if fevers/chills, erythema, induration, drainage, or persistent bleeding.   Images permanently stored and available for review in the ultrasound  unit.  Impression: Technically successful ultrasound guided injection. Lot number: [id number]       Assessment and Plan: 72 y.o. female with left knee pain due to exacerbation of DJD.  Plan for Zilretta steroid injection today.  Hopefully this will last longer than 3 months.  Check back as needed.  We can repeat this injection every 3 months if needed but will require notification prior to the next injection so we can authorize it again in 2024.   PDMP not reviewed this encounter. Orders Placed This Encounter  Procedures   [place] LIMITED JOINT SPACE STRUCTURES LOW LEFT(NO LINKED CHARGES)    Order Specific Question:   Reason for Exam (SYMPTOM  OR DIAGNOSIS REQUIRED)    Answer:   left knee pain    Order Specific Question:   Preferred imaging location?    Answer:   [place]   Meds ordered this encounter  Medications   Triamcinolone Acetonide (ZILRETTA) intra-articular injection 32 mg     Discussed warning signs or symptoms. Please see discharge instructions. Patient expresses understanding.   The above documentation has been reviewed and is accurate and complete [name], M.D.

## 2022-07-28 ENCOUNTER — Ambulatory Visit: Payer: Medicare Other | Admitting: Family Medicine

## 2022-07-28 ENCOUNTER — Ambulatory Visit: Payer: Self-pay

## 2022-07-28 VITALS — Wt 160.0 lb

## 2022-07-28 DIAGNOSIS — M1712 Unilateral primary osteoarthritis, left knee: Secondary | ICD-10-CM

## 2022-07-28 DIAGNOSIS — G8929 Other chronic pain: Secondary | ICD-10-CM | POA: Diagnosis not present

## 2022-07-28 DIAGNOSIS — M25562 Pain in left knee: Secondary | ICD-10-CM | POA: Diagnosis not present

## 2022-07-28 MED ORDER — TRIAMCINOLONE ACETONIDE 32 MG IX SRER
32.0000 mg | Freq: Once | INTRA_ARTICULAR | Status: AC
  Administered 2022-07-28: 32 mg via INTRA_ARTICULAR

## 2022-07-28 NOTE — Patient Instructions (Signed)
Thank you for coming in today.   :Let me know how you feel with this injection especially if not better.   We can do it again in 3 months but we will need to ask your insurance again in 2024.   So give me some warning ahead of time early next year about repeat Zilretta injection if needed.

## 2022-07-31 DIAGNOSIS — E039 Hypothyroidism, unspecified: Secondary | ICD-10-CM | POA: Diagnosis not present

## 2022-08-02 ENCOUNTER — Telehealth: Payer: Self-pay

## 2022-08-02 ENCOUNTER — Other Ambulatory Visit: Payer: Self-pay | Admitting: *Deleted

## 2022-08-02 IMAGING — DX DG ELBOW COMPLETE 3+V*L*
4 series · 4 of 4 positions shown · non-contrast
Comparison: None.

CLINICAL DATA: Left elbow pain after fall 4 days ago.

EXAM:
LEFT ELBOW - COMPLETE 3+ VIEW

[elbow ap]
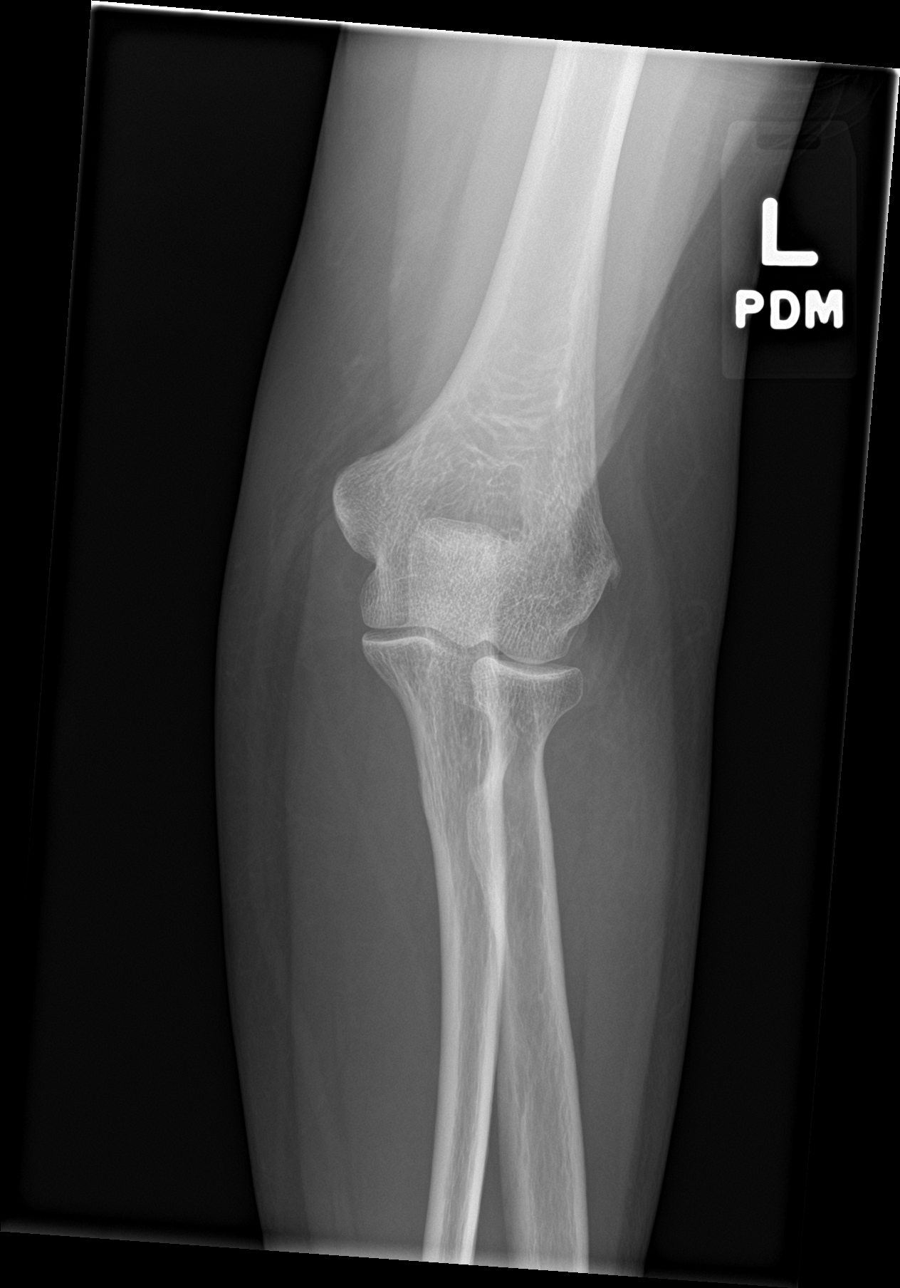

[elbow obl (1 of 2)]
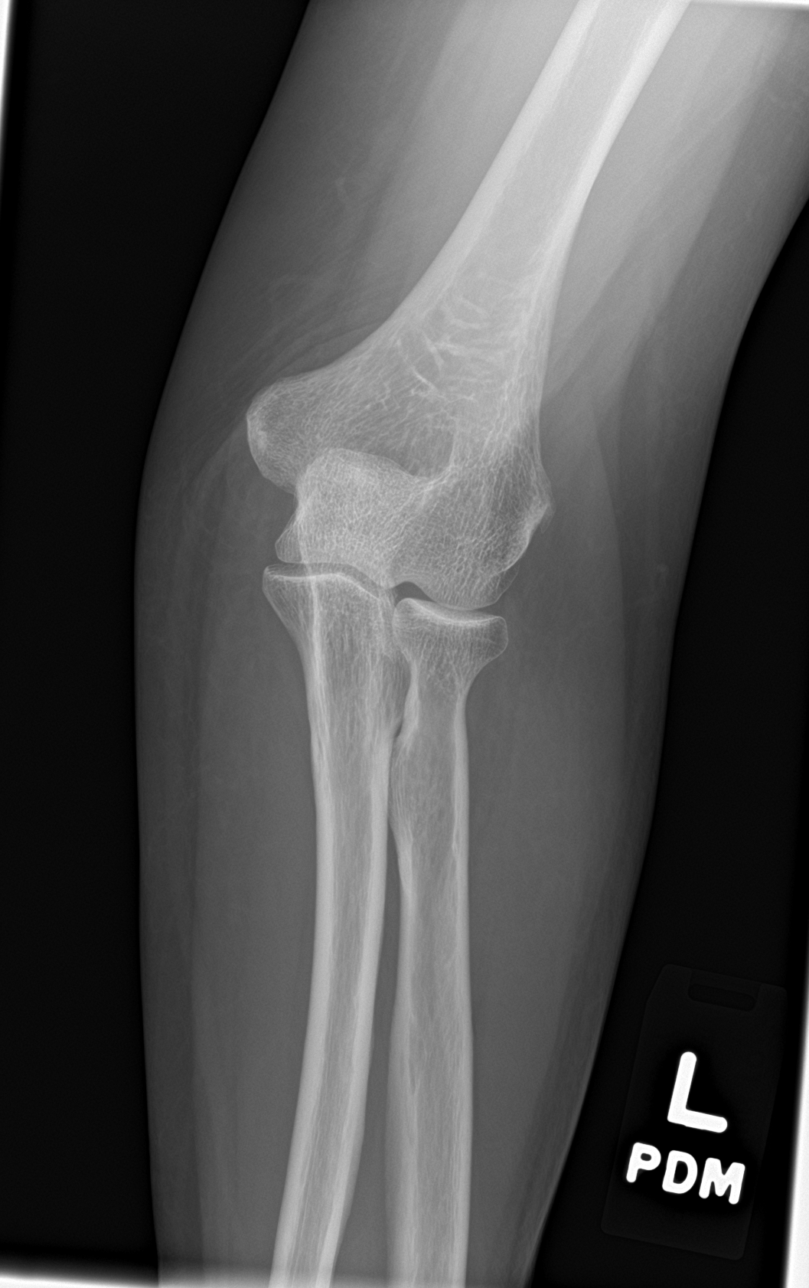

[elbow obl (2 of 2)]
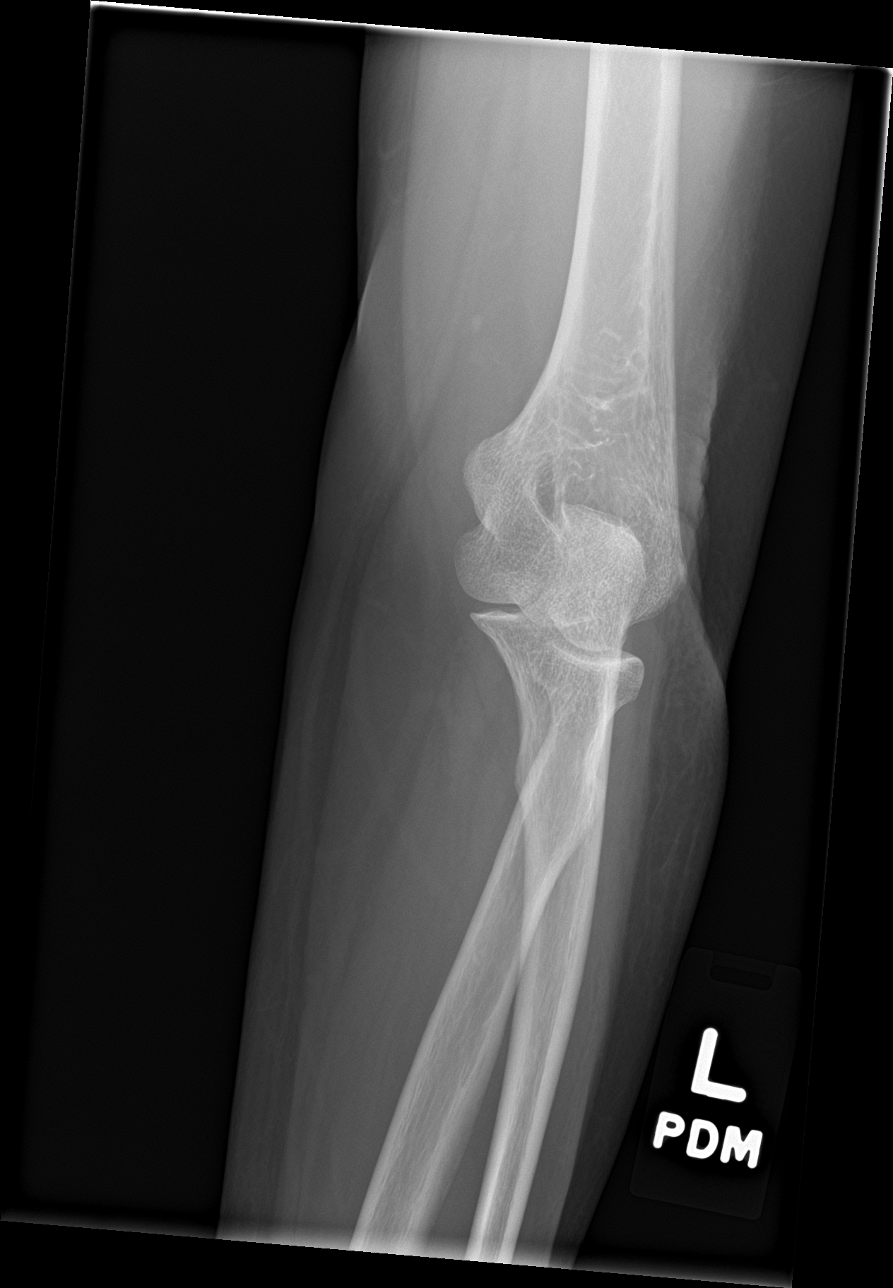

[elbow lat]
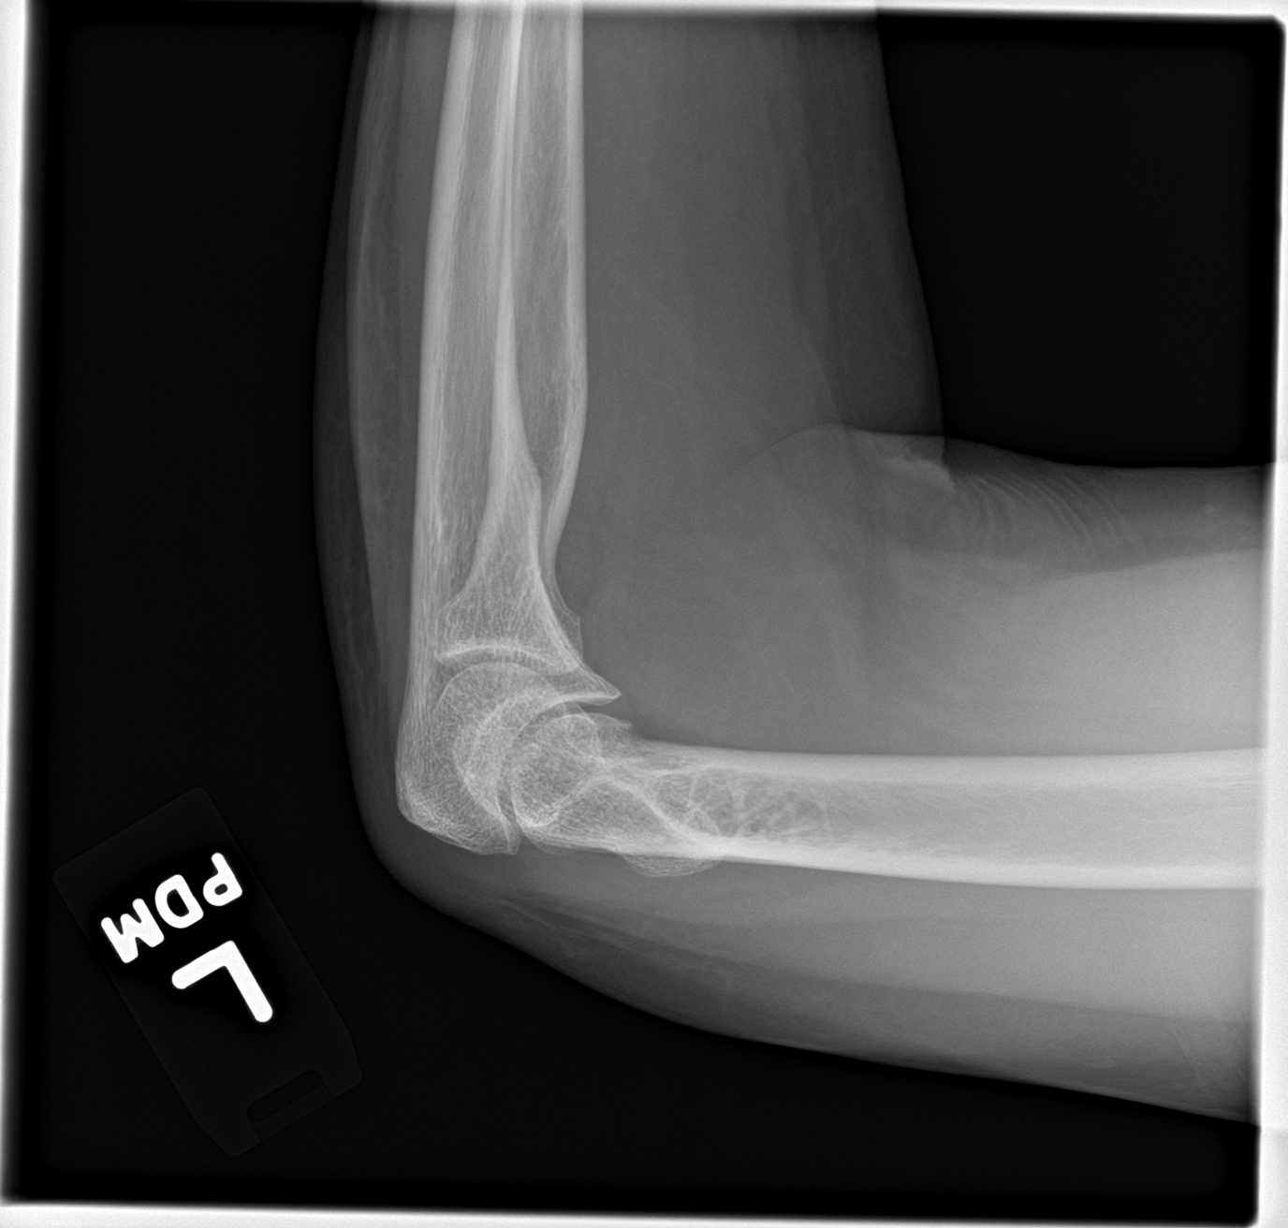

[4 of 4 positions shown; findings below may reference images not displayed]

FINDINGS: There is no evidence of fracture, dislocation, or joint effusion.
There is no evidence of arthropathy or other focal bone abnormality.
Soft tissues are unremarkable.
IMPRESSION: Negative.

## 2022-08-02 IMAGING — DX DG ANKLE COMPLETE 3+V*L*
3 series · 3 of 3 positions shown · non-contrast
Comparison: None.

CLINICAL DATA: Fell 4 days ago.  Pain.

EXAM:
LEFT ANKLE COMPLETE - 3+ VIEW

[ankle obl]
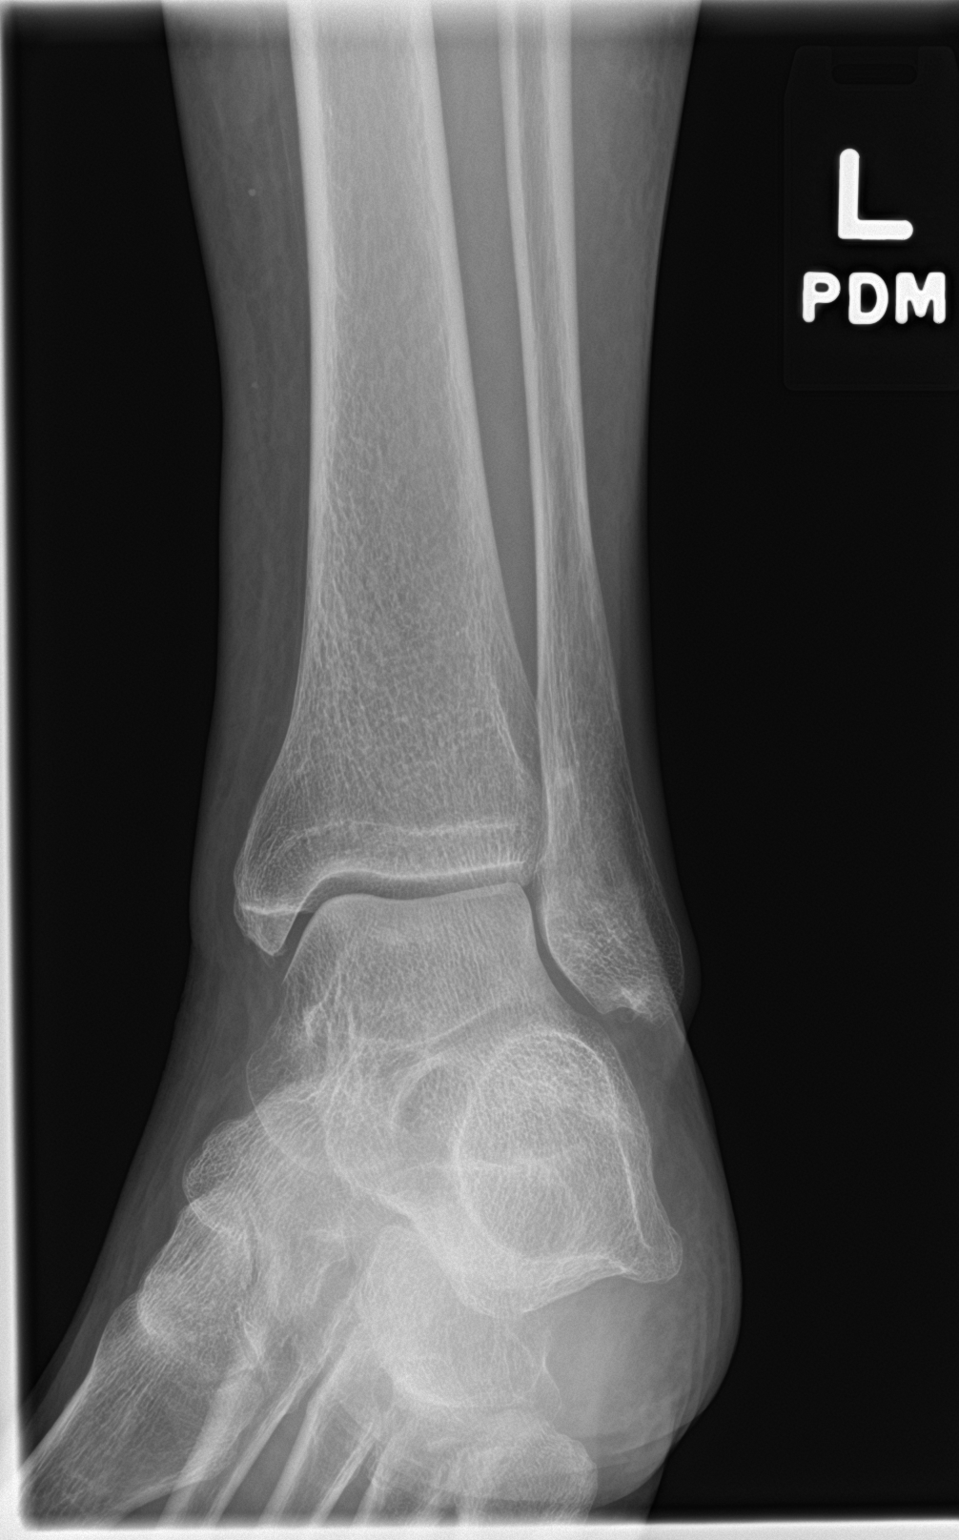

[ankle lat]
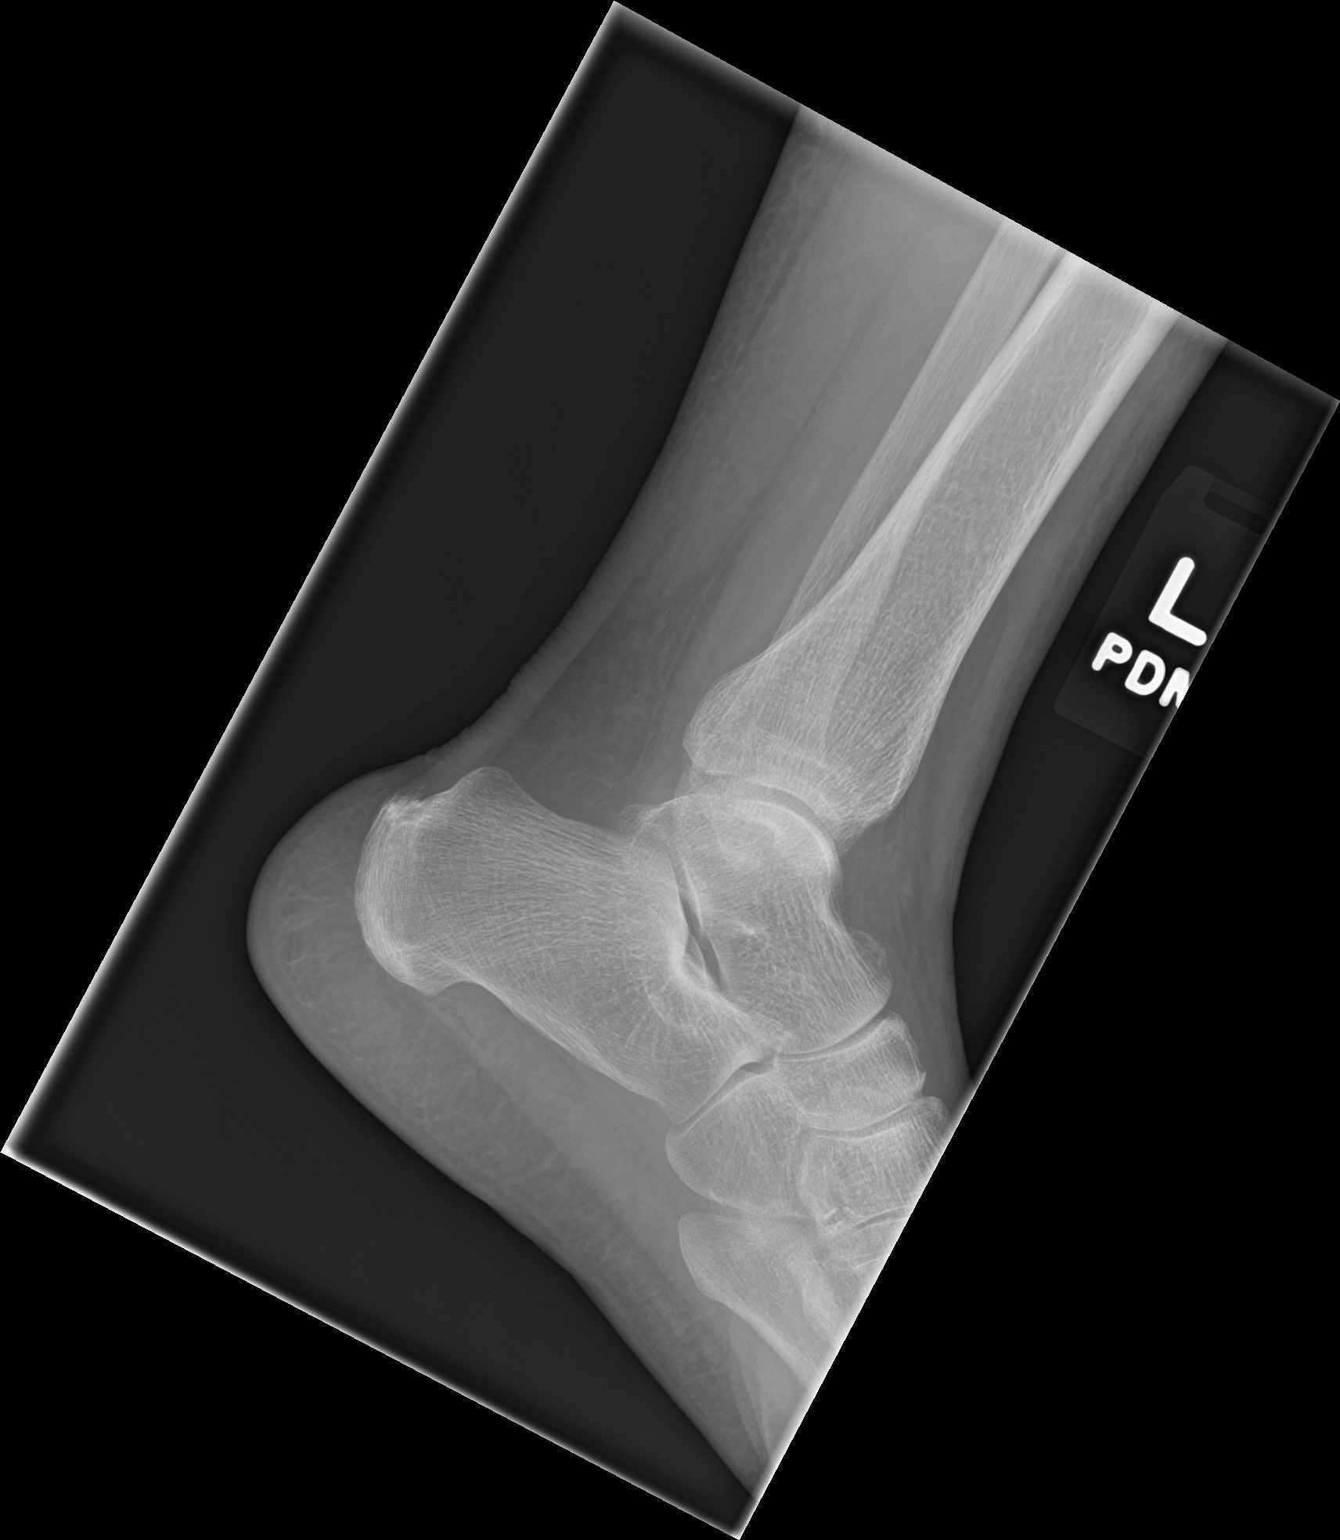

[ankle ap]
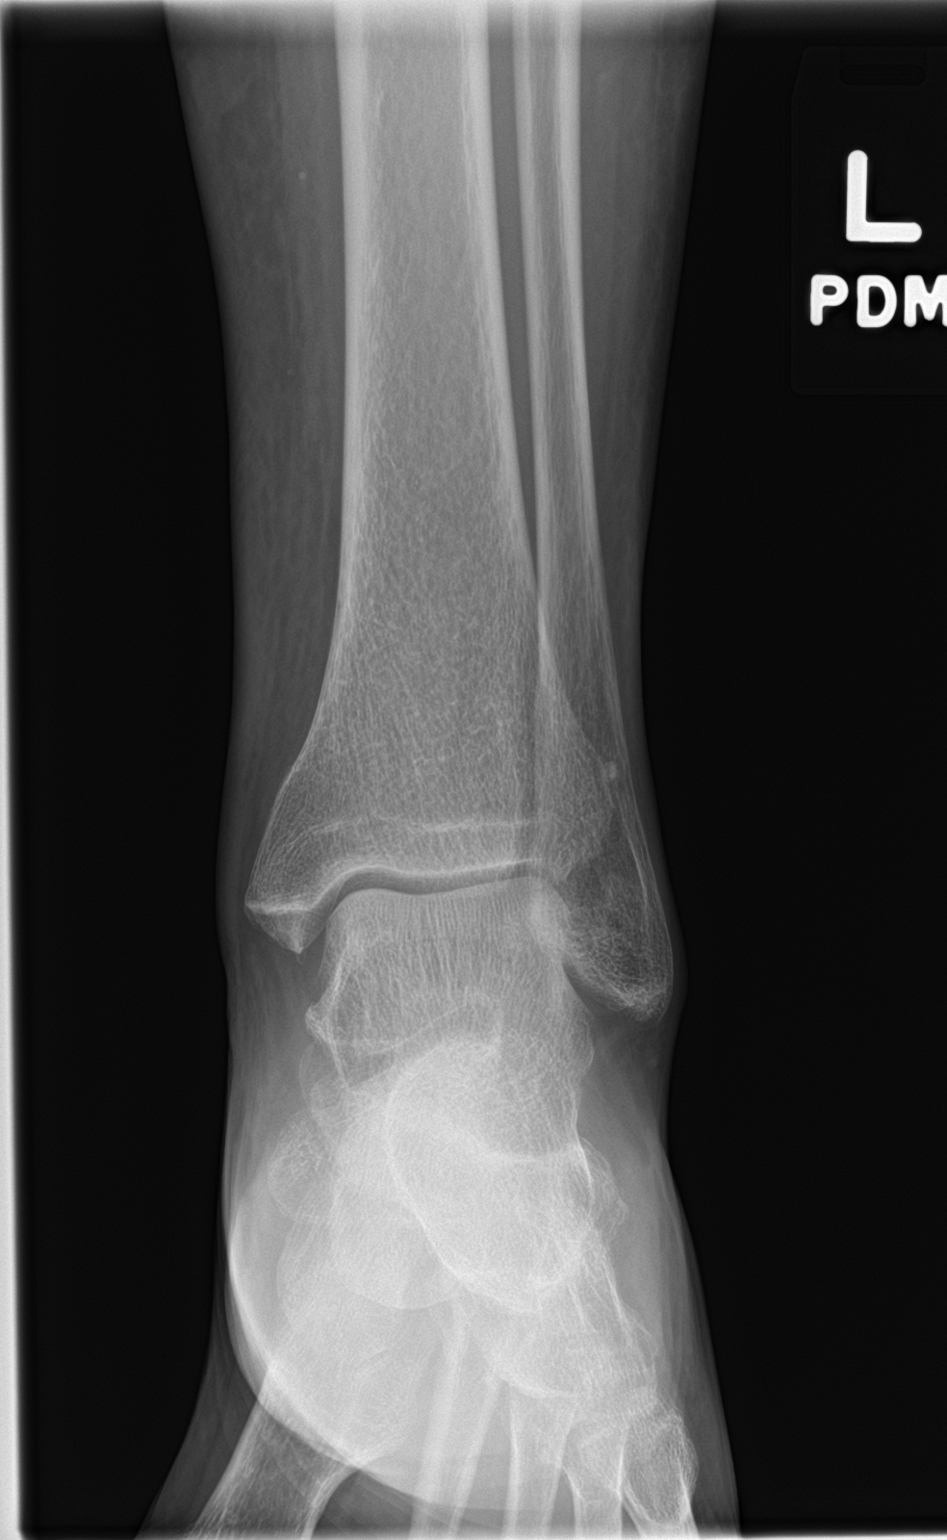

[3 of 3 positions shown; findings below may reference images not displayed]

FINDINGS: There is no evidence of fracture, dislocation, or joint effusion.
There is no evidence of arthropathy or other focal bone abnormality.
Soft tissues are unremarkable.
IMPRESSION: Negative.

## 2022-08-02 MED ORDER — FUROSEMIDE 20 MG PO TABS: ORAL_TABLET | ORAL | 3 refills | Status: DC

## 2022-08-02 NOTE — Telephone Encounter (Signed)
MEDICATION: furosemide (LASIX) 20 MG tablet  PHARMACY: [facility] - [place], [place] - [address]  Comments: Patient filed a compliant with insurance about pharmacy not refilling this medication. Pharmacy advised [employer] they faxed over several requests since August with no response. Jennifer Huang is completely out.   **Let patient know to contact pharmacy at the end of the day to make sure medication is ready. **  ** Please notify patient to allow 48-72 hours to process**  **Encourage patient to contact the pharmacy for refills or they can request refills through [facility]**

## 2022-08-02 NOTE — Telephone Encounter (Signed)
sent 

## 2022-08-07 ENCOUNTER — Telehealth: Payer: Self-pay | Admitting: Internal Medicine

## 2022-08-07 ENCOUNTER — Other Ambulatory Visit: Payer: Self-pay | Admitting: *Deleted

## 2022-08-07 DIAGNOSIS — Z23 Encounter for immunization: Secondary | ICD-10-CM | POA: Diagnosis not present

## 2022-08-07 DIAGNOSIS — Z8639 Personal history of other endocrine, nutritional and metabolic disease: Secondary | ICD-10-CM | POA: Diagnosis not present

## 2022-08-07 DIAGNOSIS — I10 Essential (primary) hypertension: Secondary | ICD-10-CM

## 2022-08-07 DIAGNOSIS — E039 Hypothyroidism, unspecified: Secondary | ICD-10-CM | POA: Diagnosis not present

## 2022-08-07 MED ORDER — AMLODIPINE BESYLATE 10 MG PO TABS: 10.0000 mg | ORAL_TABLET | Freq: Every day | ORAL | 3 refills | Status: AC

## 2022-08-07 MED ORDER — FUROSEMIDE 20 MG PO TABS
ORAL_TABLET | ORAL | 3 refills | Status: AC
Start: 2022-08-07 — End: ?

## 2022-08-07 NOTE — Telephone Encounter (Signed)
sent 

## 2022-08-07 NOTE — Telephone Encounter (Signed)
Patient came in stating that she needs a refill on her amLODipine (NORVASC) 10 MG tablet and furosemide (LASIX) 20 MG tablet Sent to [facility]. [place], [place] [address] [phone].

## 2022-08-14 DIAGNOSIS — J300 Vasomotor rhinitis: Secondary | ICD-10-CM | POA: Diagnosis not present

## 2022-08-14 DIAGNOSIS — Z91030 Bee allergy status: Secondary | ICD-10-CM | POA: Diagnosis not present

## 2022-08-14 DIAGNOSIS — H1045 Other chronic allergic conjunctivitis: Secondary | ICD-10-CM | POA: Diagnosis not present

## 2022-08-21 DIAGNOSIS — S0571XD Avulsion of right eye, subsequent encounter: Secondary | ICD-10-CM | POA: Diagnosis not present

## 2022-10-05 DIAGNOSIS — H1132 Conjunctival hemorrhage, left eye: Secondary | ICD-10-CM | POA: Diagnosis not present

## 2022-10-09 ENCOUNTER — Encounter: Payer: Medicare Other | Admitting: Internal Medicine

## 2022-10-09 DIAGNOSIS — M5440 Lumbago with sciatica, unspecified side: Secondary | ICD-10-CM | POA: Diagnosis not present

## 2022-10-11 ENCOUNTER — Ambulatory Visit (INDEPENDENT_AMBULATORY_CARE_PROVIDER_SITE_OTHER): Payer: Medicare Other

## 2022-10-11 VITALS — Ht 66.0 in | Wt 160.0 lb

## 2022-10-11 DIAGNOSIS — Z0000 Encounter for general adult medical examination without abnormal findings: Secondary | ICD-10-CM

## 2022-10-11 NOTE — Patient Instructions (Addendum)
[name] , Thank you for taking time to come for your Medicare Wellness Visit. I appreciate your ongoing commitment to your health goals. Please review the following plan we discussed and let me know if I can assist you in the future.   These are the goals we discussed:  Goals      My goal for 2024 is to work on completing my healthcare directives (Living will/Power of Attorney) and getting my Shringrix vaccine.        This is a list of the screening recommended for you and due dates:  Health Maintenance  Topic Date Due   Zoster (Shingles) Vaccine (1 of 2) Never done   DTaP/Tdap/Td vaccine (1 - Tdap) 10/18/2012   COVID-19 Vaccine (6 - 2023-24 season) 12/05/2022   Medicare Annual Wellness Visit  10/12/2023   Mammogram  10/14/2023   Colon Cancer Screening  09/22/2030   Pneumonia Vaccine  Completed   Flu Shot  Completed   DEXA scan (bone density measurement)  Completed   Hepatitis C Screening: USPSTF Recommendation to screen - Ages 3-79 yo.  Completed   HPV Vaccine  Aged Out    Advanced directives: No  Conditions/risks identified: Yes  Next appointment: Follow up in one year for your annual wellness visit.   Preventive Care 71 Years and Older, Female Preventive care refers to lifestyle choices and visits with your health care provider that can promote health and wellness. What does preventive care include? A yearly physical exam. This is also called an annual well check. Dental exams once or twice a year. Routine eye exams. Ask your health care provider how often you should have your eyes checked. Personal lifestyle choices, including: Daily care of your teeth and gums. Regular physical activity. Eating a healthy diet. Avoiding tobacco and drug use. Limiting alcohol use. Practicing safe sex. Taking low-dose aspirin every day. Taking vitamin and mineral supplements as recommended by your health care provider. What happens during an annual well check? The services and  screenings done by your health care provider during your annual well check will depend on your age, overall health, lifestyle risk factors, and family history of disease. Counseling  Your health care provider may ask you questions about your: Alcohol use. Tobacco use. Drug use. Emotional well-being. Home and relationship well-being. Sexual activity. Eating habits. History of falls. Memory and ability to understand (cognition). Work and work Statistician. Reproductive health. Screening  You may have the following tests or measurements: Height, weight, and BMI. Blood pressure. Lipid and cholesterol levels. These may be checked every 5 years, or more frequently if you are over 38 years old. Skin check. Lung cancer screening. You may have this screening every year starting at age 27 if you have a 30-pack-year history of smoking and currently smoke or have quit within the past 15 years. Fecal occult blood test (FOBT) of the stool. You may have this test every year starting at age 60. Flexible sigmoidoscopy or colonoscopy. You may have a sigmoidoscopy every 5 years or a colonoscopy every 10 years starting at age 10. Hepatitis C blood test. Hepatitis B blood test. Sexually transmitted disease (STD) testing. Diabetes screening. This is done by checking your blood sugar (glucose) after you have not eaten for a while (fasting). You may have this done every 1-3 years. Bone density scan. This is done to screen for osteoporosis. You may have this done starting at age 64. Mammogram. This may be done every 1-2 years. Talk to your health care  provider about how often you should have regular mammograms. Talk with your health care provider about your test results, treatment options, and if necessary, the need for more tests. Vaccines  Your health care provider may recommend certain vaccines, such as: Influenza vaccine. This is recommended every year. Tetanus, diphtheria, and acellular pertussis (Tdap,  Td) vaccine. You may need a Td booster every 10 years. Zoster vaccine. You may need this after age 28. Pneumococcal 13-valent conjugate (PCV13) vaccine. One dose is recommended after age 81. Pneumococcal polysaccharide (PPSV23) vaccine. One dose is recommended after age 66. Talk to your health care provider about which screenings and vaccines you need and how often you need them. This information is not intended to replace advice given to you by your health care provider. Make sure you discuss any questions you have with your health care provider. Document Released: 11/05/2015 Document Revised: 06/28/2016 Document Reviewed: 08/10/2015 Elsevier Interactive Patient Education  2017 [place] Prevention in the Home Falls can cause injuries. They can happen to people of all ages. There are many things you can do to make your home safe and to help prevent falls. What can I do on the outside of my home? Regularly fix the edges of walkways and driveways and fix any cracks. Remove anything that might make you trip as you walk through a door, such as a raised step or threshold. Trim any bushes or trees on the path to your home. Use bright outdoor lighting. Clear any walking paths of anything that might make someone trip, such as rocks or tools. Regularly check to see if handrails are loose or broken. Make sure that both sides of any steps have handrails. Any raised decks and porches should have guardrails on the edges. Have any leaves, snow, or ice cleared regularly. Use sand or salt on walking paths during winter. Clean up any spills in your garage right away. This includes oil or grease spills. What can I do in the bathroom? Use night lights. Install grab bars by the toilet and in the tub and shower. Do not use towel bars as grab bars. Use non-skid mats or decals in the tub or shower. If you need to sit down in the shower, use a plastic, non-slip stool. Keep the floor dry. Clean up any  water that spills on the floor as soon as it happens. Remove soap buildup in the tub or shower regularly. Attach bath mats securely with double-sided non-slip rug tape. Do not have throw rugs and other things on the floor that can make you trip. What can I do in the bedroom? Use night lights. Make sure that you have a light by your bed that is easy to reach. Do not use any sheets or blankets that are too big for your bed. They should not hang down onto the floor. Have a firm chair that has side arms. You can use this for support while you get dressed. Do not have throw rugs and other things on the floor that can make you trip. What can I do in the kitchen? Clean up any spills right away. Avoid walking on wet floors. Keep items that you use a lot in easy-to-reach places. If you need to reach something above you, use a strong step stool that has a grab bar. Keep electrical cords out of the way. Do not use floor polish or wax that makes floors slippery. If you must use wax, use non-skid floor wax. Do not have throw rugs  and other things on the floor that can make you trip. What can I do with my stairs? Do not leave any items on the stairs. Make sure that there are handrails on both sides of the stairs and use them. Fix handrails that are broken or loose. Make sure that handrails are as long as the stairways. Check any carpeting to make sure that it is firmly attached to the stairs. Fix any carpet that is loose or worn. Avoid having throw rugs at the top or bottom of the stairs. If you do have throw rugs, attach them to the floor with carpet tape. Make sure that you have a light switch at the top of the stairs and the bottom of the stairs. If you do not have them, ask someone to add them for you. What else can I do to help prevent falls? Wear shoes that: Do not have high heels. Have rubber bottoms. Are comfortable and fit you well. Are closed at the toe. Do not wear sandals. If you use a  stepladder: Make sure that it is fully opened. Do not climb a closed stepladder. Make sure that both sides of the stepladder are locked into place. Ask someone to hold it for you, if possible. Clearly mark and make sure that you can see: Any grab bars or handrails. First and last steps. Where the edge of each step is. Use tools that help you move around (mobility aids) if they are needed. These include: Canes. Walkers. Scooters. Crutches. Turn on the lights when you go into a dark area. Replace any light bulbs as soon as they burn out. Set up your furniture so you have a clear path. Avoid moving your furniture around. If any of your floors are uneven, fix them. If there are any pets around you, be aware of where they are. Review your medicines with your doctor. Some medicines can make you feel dizzy. This can increase your chance of falling. Ask your doctor what other things that you can do to help prevent falls. This information is not intended to replace advice given to you by your health care provider. Make sure you discuss any questions you have with your health care provider. Document Released: 08/05/2009 Document Revised: 03/16/2016 Document Reviewed: 11/13/2014 Elsevier Interactive Patient Education  2017 [employer].

## 2022-10-11 NOTE — Progress Notes (Signed)
Virtual Visit via Telephone Note  I connected with  Jennifer Huang on 10/11/22 at  1:00 PM EST by telephone and verified that I am speaking with the correct person using two identifiers.  Location: Patient: Home Provider: [place] Persons participating in the virtual visit: [place]   I discussed the limitations, risks, security and privacy concerns of performing an evaluation and management service by telephone and the availability of in person appointments. The patient expressed understanding and agreed to proceed.  Interactive audio and video telecommunications were attempted between this nurse and patient, however failed, due to patient having technical difficulties OR patient did not have access to video capability.  We continued and completed visit with audio only.  Some vital signs may be absent or patient reported.   Sheral Flow, LPN  Subjective:   Jennifer Huang is a 71 y.o. female who presents for Medicare Annual (Subsequent) preventive examination.  Review of Systems     Cardiac Risk Factors include: advanced age (>36mn, >>65women);family history of premature cardiovascular disease;dyslipidemia;hypertension     Objective:    Today's Vitals   10/11/22 1302  Weight: 160 lb (72.6 kg)  Height: '5\' 6"'$  (1.676 m)  PainSc: 5   PainLoc: Back   Body mass index is 25.82 kg/m.     10/11/2022    1:11 PM 10/04/2021    9:42 AM 09/28/2020    1:26 PM 09/08/2019    8:26 AM 09/01/2019   12:49 PM 07/28/2019   12:01 PM 07/25/2018   10:04 AM  Advanced Directives  Does Patient Have a Medical Advance Directive? No No Yes No No No No  Type of Advance Directive   [place]     Does patient want to make changes to medical advance directive?   No - Patient declined      Copy of [place] Chart?   No - copy requested      Would patient like information on creating a medical advance directive? No -  Patient declined No - Patient declined  No - Patient declined No - Patient declined Yes (ED - Information included in AVS) Yes (ED - Information included in AVS)    Current Medications (verified) Outpatient Encounter Medications as of 10/11/2022  Medication Sig   AMBULATORY NON FORMULARY MEDICATION Orthotic right shoe to correct leg length difference. Disp 1 or a pair.  Fax to [facility] M21.70   amLODipine (NORVASC) 10 MG tablet Take 1 tablet (10 mg total) by mouth daily.   Ascorbic Acid (VITAMIN C PO) Take 250 mg by mouth.   aspirin EC 81 MG tablet Take 81 mg by mouth daily. Swallow whole.    Azelastine HCl 0.15 % SOLN Place 2 sprays into the nose every 12 (twelve) hours as needed.   brimonidine (ALPHAGAN P) 0.1 % SOLN 3 (three) times daily.   CALCIUM PO Take 600 mg by mouth daily.   colchicine 0.6 MG tablet TAKE 1 TABLET (0.6 MG TOTAL) BY MOUTH DAILY AS NEEDED (GOUT PAIN).   cyclobenzaprine (FLEXERIL) 5 MG tablet Take 1 tablet (5 mg total) by mouth 3 (three) times daily as needed for muscle spasms. (Patient not taking: Reported on 11/21/2021)   diclofenac Sodium (VOLTAREN) 1 % GEL Apply 2 g topically 4 (four) times daily as needed.    dorzolamide-timolol (COSOPT) 22.3-6.8 MG/ML ophthalmic solution 1 drop 2 (two) times daily.   EPINEPHrine 0.3 mg/0.3 mL IJ SOAJ injection  Inject 0.3 mg into the muscle as needed for anaphylaxis. GENERIC TWINJECT PREFERRED    fexofenadine (ALLEGRA) 180 MG tablet Take 180 mg by mouth daily.   fluticasone (FLONASE) 50 MCG/ACT nasal spray Place 2 sprays into both nostrils daily.   furosemide (LASIX) 20 MG tablet TAKE 1 TABLET BY MOUTH  DAILY IF NEEDED   ibuprofen (ADVIL) 600 MG tablet Take 1 tablet (600 mg total) by mouth every 8 (eight) hours as needed.   ipratropium (ATROVENT) 0.06 % nasal spray Place 2 sprays into both nostrils 4 (four) times daily.   levothyroxine (SYNTHROID) 50 MCG tablet Take 50 mcg by mouth daily.   lidocaine (LIDODERM)  5 % Place 1 patch onto the skin daily. Remove & Discard patch within 12 hours or as directed by MD   Multiple Vitamin (MULTIVITAMIN) tablet Take 1 tablet by mouth daily.   Multiple Vitamins-Minerals (HAIR SKIN AND NAILS FORMULA PO) Take 1 tablet by mouth daily.   Olopatadine HCl 0.2 % SOLN Apply 1 drop to eye daily.   ROCKLATAN 0.02-0.005 % SOLN    TART CHERRY PO Take 1 tablet by mouth daily.    traMADol (ULTRAM) 50 MG tablet Take 1 tablet (50 mg total) by mouth every 8 (eight) hours as needed for severe pain.   vitamin B-12 (CYANOCOBALAMIN) 1000 MCG tablet Take 100 mcg by mouth daily.   VITAMIN D PO Take 1,000 Units by mouth daily.   No facility-administered encounter medications on file as of 10/11/2022.    Allergies (verified) Penicillins, Prednisone, and Sulfonamide derivatives   History: Past Medical History:  Diagnosis Date   Allergies    Allergy    year around seasonal allergies   Colon polyps    anal polyp   Diabetes mellitus without complication ([place])    pre DM-dieto controlled at this time;   Gout, unspecified    Hyperlipidemia    on meds   Osteoporosis    per Dr. Sharlet Salina with proof from bone density scan    Primary localized osteoarthrosis, lower leg    Thyroid disease 08/2019   RIGHT thyroid removed   Thyroid mass    right   Unspecified essential hypertension    on meds   Unspecified glaucoma(365.9)    Left Eye   Past Surgical History:  Procedure Laterality Date   COLON SURGERY     anal polyp excised gen'l anesthesia   COLONOSCOPY  10-14-2010   jacobs-hx TA 2006   COLONOSCOPY  2016   jacobs-TA x 2 -81yrrecall-miralax good   ENUCLEATION     OD 2nd to Glaucoma   OOPHORECTOMY     Had Blockage   POLYPECTOMY  2016   TA x 2   SALPINGECTOMY     Had Blockage   THYROIDECTOMY Right 09/08/2019   Procedure: RIGHT HEMI-THYROIDECTOMY;  Surgeon: TLeta Baptist MD;  [facility]  Service: ENT;  Laterality: Right;   THYROIDECTOMY, PARTIAL  Right    TOTAL ABDOMINAL HYSTERECTOMY  2002   WISDOM TOOTH EXTRACTION     Family History  Problem Relation Age of Onset   Osteoarthritis Mother    Hypertension Mother    Emphysema Father    Diabetes Father    Hypertension Father    Arthritis Sister        hip replacments, wrist surgery   Hyperlipidemia Sister    Hypertension Sister    Diabetes Brother    Hypertension Brother    Hyperlipidemia Brother    Cancer - Other  Brother    Heart disease Brother    Hypertension Brother    Hyperlipidemia Brother    Diabetes Brother    Colon polyps Brother    Diabetes Brother    Heart disease Brother    Hypertension Brother    Hyperlipidemia Brother    Colon polyps Brother    Esophageal cancer Neg Hx    Rectal cancer Neg Hx    Stomach cancer Neg Hx    Social History   Socioeconomic History   Marital status: Widowed    Spouse name: Not on file   Number of children: 0   Years of education: Not on file   Highest education level: Not on file  Occupational History   Occupation: Scientist, research (physical sciences): RETIRED   Occupation: SO - self-employed  Tobacco Use   Smoking status: Never   Smokeless tobacco: Never  Vaping Use   Vaping Use: Never used  Substance and Sexual Activity   Alcohol use: No    Alcohol/week: 0.0 standard drinks of alcohol   Drug use: No   Sexual activity: Not Currently    Partners: Male    Birth control/protection: Surgical  Other Topics Concern   Not on file  Social History Narrative   HSG, @ year college. Married '73. No children. On disability - orthopedic: knee, back, shortened leg.. SO - self-employed.      Dec '12 - many stressors: husband with cancer, brother with pancreatic cancer, housing and financial issues.             Social Determinants of Health   Financial Resource Strain: Low Risk  (10/11/2022)   Overall Financial Resource Strain (CARDIA)    Difficulty of Paying Living Expenses: Not hard at all  Food Insecurity: No Food Insecurity  (10/11/2022)   Hunger Vital Sign    Worried About Running Out of Food in the Last Year: Never true    Ran Out of Food in the Last Year: Never true  Transportation Needs: No Transportation Needs (10/11/2022)   PRAPARE - Hydrologist (Medical): No    Lack of Transportation (Non-Medical): No  Physical Activity: Sufficiently Active (10/11/2022)   Exercise Vital Sign    Days of Exercise per Week: 5 days    Minutes of Exercise per Session: 30 min  Stress: No Stress Concern Present (10/11/2022)   [place]    Feeling of Stress : Not at all  Social Connections: Moderately Integrated (10/11/2022)   Social Connection and Isolation Panel [NHANES]    Frequency of Communication with Friends and Family: More than three times a week    Frequency of Social Gatherings with Friends and Family: Once a week    Attends Religious Services: More than 4 times per year    Active Member of [employer] or Organizations: Yes    Attends Archivist Meetings: More than 4 times per year    Marital Status: Widowed    Tobacco Counseling Counseling given: Not Answered   Clinical Intake:  Pre-visit preparation completed: Yes  Pain : No/denies pain Pain Score: 5      BMI - recorded: 25.82 Nutritional Status: BMI 25 -29 Overweight Nutritional Risks: None Diabetes: No  How often do you need to have someone help you when you read instructions, pamphlets, or other written materials from your doctor or pharmacy?: 1 - Never What is the last grade level you completed in school?: HSG  Diabetic? no  Interpreter Needed?: No  Information entered by :: Lisette Abu, LPN.   Activities of Daily Living    10/11/2022    1:22 PM  In your present state of health, do you have any difficulty performing the following activities:  Hearing? 0  Vision? 0  Difficulty concentrating or making decisions? 0  Walking or  climbing stairs? 0  Dressing or bathing? 0  Doing errands, shopping? 0  Preparing Food and eating ? N  Using the Toilet? N  In the past six months, have you accidently leaked urine? N  Do you have problems with loss of bowel control? N  Managing your Medications? N  Managing your Finances? N  Housekeeping or managing your Housekeeping? N    Patient Care Team: Hoyt Koch, MD as PCP - General (Internal Medicine) Lomax, Marny Lowenstein, MD (Inactive) (Obstetrics and Gynecology) Marylynn Pearson, MD (Ophthalmology) Mosetta Anis, MD (Allergy) Rulon Sera, MD (Rehabilitation) Leta Baptist, MD as Consulting Physician (Otolaryngology) Delsa Sale Sudie Grumbling, MD as Referring Physician (Ophthalmology) Delice Bison, Darnelle Maffucci, [facility] (Inactive) (Pharmacist)  Indicate any recent Medical Services you may have received from other than [facility] providers in the past year (date may be approximate).     Assessment:   This is a routine wellness examination for [facility].  Hearing/Vision screen Hearing Screening - Comments:: Denies hearing difficulties   Vision Screening - Comments:: Wears rx glasses - up to date with routine eye exams with [facility]   Dietary issues and exercise activities discussed: Current Exercise Habits: Home exercise routine, Type of exercise: walking, Time (Minutes): 30, Frequency (Times/Week): 5, Weekly Exercise (Minutes/Week): 150, Intensity: Moderate, Exercise limited by: orthopedic condition(s)   Goals Addressed   None   Depression Screen    10/11/2022    1:24 PM 10/04/2021   12:32 PM 09/28/2020    1:27 PM 07/28/2019   10:56 AM 07/25/2018   10:05 AM 07/24/2017    1:07 PM 04/28/2016    4:26 PM  PHQ 2/9 Scores  PHQ - 2 Score 0 0 0 0 1 0 0  PHQ- 9 Score     3      Fall Risk    10/11/2022    1:13 PM 10/04/2021   12:36 PM 09/28/2020    1:27 PM 07/28/2019   10:56 AM 07/25/2018   10:05 AM  Fall Risk   Falls in the past year? 0 '1 1 1 '$ Yes  Number falls in past yr: 0 0  1 0 1  Injury with Fall? 0 '1 1 1 '$ Yes  Risk for fall due to : No Fall Risks  Orthopedic patient;Impaired balance/gait  Impaired mobility  Follow up Falls prevention discussed Falls prevention discussed Falls evaluation completed;Education provided  Falls prevention discussed;Education provided    FALL RISK PREVENTION PERTAINING TO THE HOME:  Any stairs in or around the home? Yes  If so, are there any without handrails? No  Home free of loose throw rugs in walkways, pet beds, electrical cords, etc? Yes  Adequate lighting in your home to reduce risk of falls? Yes   ASSISTIVE DEVICES UTILIZED TO PREVENT FALLS:  Life alert? Yes  Use of a cane, walker or w/c? Yes  Grab bars in the bathroom? Yes  Shower chair or bench in shower? No  Elevated toilet seat or a handicapped toilet? Yes   TIMED UP AND GO: Phone Visit  Was the test performed? No .   Cognitive Function:    07/25/2018  9:36 AM  MMSE - Mini Mental State Exam  Orientation to time 5  Orientation to Place 5  Registration 3  Attention/ Calculation 5  Recall 1  Language- name 2 objects 2  Language- repeat 1  Language- follow 3 step command 3  Language- read & follow direction 1  Write a sentence 1  Copy design 1  Total score 28        10/11/2022    1:22 PM 07/28/2019   12:28 PM  6CIT Screen  What Year? 0 points 0 points  What month? 0 points 0 points  What time? 0 points 0 points  Count back from 20 0 points 0 points  Months in reverse 0 points 0 points  Repeat phrase 0 points 2 points  Total Score 0 points 2 points    Immunizations Immunization History  Administered Date(s) Administered   Fluad Quad(high Dose 65+) 07/28/2019, 07/05/2020, 08/17/2021   Influenza Whole 11/09/2009   Influenza, High Dose Seasonal PF 08/31/2016, 07/24/2017, 07/25/2018   Influenza, Seasonal, Injecte, Preservative Fre 10/17/2012   Influenza,inj,Quad PF,6+ Mos 07/10/2014   Moderna Covid-19 Vaccine Bivalent Booster 37yr & up  09/05/2021   Moderna Sars-Covid-2 Vaccination 12/05/2019, 01/05/2020, 08/17/2020   PNEUMOCOCCAL CONJUGATE-20 05/30/2021   Pneumococcal Conjugate-13 07/24/2017   Pneumococcal Polysaccharide-23 03/28/2016   Tetanus 10/17/2012    TDAP status: Due, Education has been provided regarding the importance of this vaccine. Advised may receive this vaccine at local pharmacy or Health Dept. Aware to provide a copy of the vaccination record if obtained from local pharmacy or Health Dept. Verbalized acceptance and understanding.  Flu Vaccine status: Up to date  Pneumococcal vaccine status: Up to date  Covid-19 vaccine status: Completed vaccines  Qualifies for Shingles Vaccine? Yes   Zostavax completed No   Shingrix Completed?: No.    Education has been provided regarding the importance of this vaccine. Patient has been advised to call insurance company to determine out of pocket expense if they have not yet received this vaccine. Advised may also receive vaccine at local pharmacy or Health Dept. Verbalized acceptance and understanding.  Screening Tests Health Maintenance  Topic Date Due   Zoster Vaccines- Shingrix (1 of 2) Never done   DTaP/Tdap/Td (1 - Tdap) 10/18/2012   COVID-19 Vaccine (5 - 2023-24 season) 06/23/2022   Medicare Annual Wellness (AWV)  10/12/2023   MAMMOGRAM  10/14/2023   COLONOSCOPY (Pts 45-454yrInsurance coverage will need to be confirmed)  09/22/2030   Pneumonia Vaccine 6537Years old  Completed   INFLUENZA VACCINE  Completed   DEXA SCAN  Completed   Hepatitis C Screening  Completed   HPV VACCINES  Aged Out    Health Maintenance  Health Maintenance Due  Topic Date Due   Zoster Vaccines- Shingrix (1 of 2) Never done   DTaP/Tdap/Td (1 - Tdap) 10/18/2012   COVID-19 Vaccine (5 - 2023-24 season) 06/23/2022    Colorectal cancer screening: Type of screening: Colonoscopy. Completed 09/22/2020. Repeat every 10 years  Mammogram status: Completed 10/13/2021. Repeat every  year [facility] [facility] [facility]: Completed 07/28/2019. Results reflect: Bone density results: OSTEOPOROSIS. Repeat every 2 years.  Lung Cancer Screening: (Low Dose CT Chest recommended if Age 960-80ears, 30 pack-year currently smoking OR have quit w/in 15years.) does not qualify.   Lung Cancer Screening Referral: no  Additional Screening:  Hepatitis C Screening: does not qualify; Completed no  Vision Screening: Recommended annual ophthalmology exams for early detection of glaucoma and other  disorders of the eye. Is the patient up to date with their annual eye exam?  Yes  Who is the provider or what is the name of the office in which the patient attends annual eye exams? [facility] If pt is not established with a provider, would they like to be referred to a provider to establish care? No .   Dental Screening: Recommended annual dental exams for proper oral hygiene  Community Resource Referral / Chronic Care Management: CRR required this visit?  No   CCM required this visit?  No      Plan:     I have personally reviewed and noted the following in the patient's chart:   Medical and social history Use of alcohol, tobacco or illicit drugs  Current medications and supplements including opioid prescriptions. Patient is not currently taking opioid prescriptions. Functional ability and status Nutritional status Physical activity Advanced directives List of other physicians Hospitalizations, surgeries, and ER visits in previous 12 months Vitals Screenings to include cognitive, depression, and falls Referrals and appointments  In addition, I have reviewed and discussed with patient certain preventive protocols, quality metrics, and best practice recommendations. A written personalized care plan for preventive services as well as general preventive health recommendations were provided to patient.     Sheral Flow, LPN   07/37/1062    Nurse Notes: N/A

## 2022-10-12 ENCOUNTER — Encounter: Payer: Self-pay | Admitting: Internal Medicine

## 2022-10-12 ENCOUNTER — Ambulatory Visit (INDEPENDENT_AMBULATORY_CARE_PROVIDER_SITE_OTHER): Payer: Medicare Other | Admitting: Internal Medicine

## 2022-10-12 VITALS — BP 120/60 | HR 73 | Temp 98.5°F | Ht 66.0 in | Wt 157.0 lb

## 2022-10-12 DIAGNOSIS — E890 Postprocedural hypothyroidism: Secondary | ICD-10-CM

## 2022-10-12 DIAGNOSIS — Z0000 Encounter for general adult medical examination without abnormal findings: Secondary | ICD-10-CM | POA: Diagnosis not present

## 2022-10-12 DIAGNOSIS — E049 Nontoxic goiter, unspecified: Secondary | ICD-10-CM

## 2022-10-12 DIAGNOSIS — I10 Essential (primary) hypertension: Secondary | ICD-10-CM | POA: Diagnosis not present

## 2022-10-12 DIAGNOSIS — Z970 Presence of artificial eye: Secondary | ICD-10-CM

## 2022-10-12 DIAGNOSIS — E7800 Pure hypercholesterolemia, unspecified: Secondary | ICD-10-CM

## 2022-10-12 LAB — COMPREHENSIVE METABOLIC PANEL
ALT: 14 U/L (ref 0–35)
AST: 19 U/L (ref 0–37)
Albumin: 4.6 g/dL (ref 3.5–5.2)
Alkaline Phosphatase: 60 U/L (ref 39–117)
BUN: 12 mg/dL (ref 6–23)
CO2: 30 mEq/L (ref 19–32)
Calcium: 10.2 mg/dL (ref 8.4–10.5)
Chloride: 104 mEq/L (ref 96–112)
Creatinine, Ser: 0.87 mg/dL (ref 0.40–1.20)
GFR: 66.86 mL/min (ref >60.00)
Glucose, Bld: 156 mg/dL — ABNORMAL HIGH (ref 70–99)
Potassium: 4.3 mEq/L (ref 3.5–5.1)
Sodium: 142 mEq/L (ref 135–145)
Total Bilirubin: 0.7 mg/dL (ref 0.2–1.2)
Total Protein: 7.4 g/dL (ref 6.0–8.3)

## 2022-10-12 LAB — LIPID PANEL
Cholesterol: 245 mg/dL — ABNORMAL HIGH (ref 0–200)
HDL: 93.9 mg/dL (ref >39.00)
LDL Cholesterol: 138 mg/dL — ABNORMAL HIGH (ref 0–99)
NonHDL: 151.13
Total CHOL/HDL Ratio: 3
Triglycerides: 67 mg/dL (ref 0.0–149.0)
VLDL: 13.4 mg/dL (ref 0.0–40.0)

## 2022-10-12 LAB — CBC
HCT: 39.9 % (ref 36.0–46.0)
Hemoglobin: 13.2 g/dL (ref 12.0–15.0)
MCHC: 33.2 g/dL (ref 30.0–36.0)
MCV: 91.3 fl (ref 78.0–100.0)
Platelets: 287 10*3/uL (ref 150.0–400.0)
RBC: 4.36 Mil/uL (ref 3.87–5.11)
RDW: 14.1 % (ref 11.5–15.5)
WBC: 7.1 10*3/uL (ref 4.0–10.5)

## 2022-10-12 LAB — T4, FREE: Free T4: 0.92 ng/dL (ref 0.60–1.60)

## 2022-10-12 LAB — TSH: TSH: 1.62 u[IU]/mL (ref 0.35–5.50)

## 2022-10-12 NOTE — Assessment & Plan Note (Signed)
Checking TSH and free T4 and adjust levothyroxine 50 mcg daily as needed.

## 2022-10-12 NOTE — Assessment & Plan Note (Signed)
Does need regular cleaning and replacement of prosthesis as needed can fill out forms or do letters for this as needed.

## 2022-10-12 NOTE — Assessment & Plan Note (Signed)
Checking lipid panel and adjust as needed. Not on medication.

## 2022-10-12 NOTE — Progress Notes (Signed)
   Subjective:   Patient ID: Jennifer Huang, female    DOB: [birth year], 71 y.o.   MRN: [id number]  HPI The patient is here for physical.  PMH, [facility], social history reviewed and updated  Review of Systems  Constitutional: Negative.   HENT: Negative.    Eyes: Negative.   Respiratory:  Negative for cough, chest tightness and shortness of breath.   Cardiovascular:  Negative for chest pain, palpitations and leg swelling.  Gastrointestinal:  Negative for abdominal distention, abdominal pain, constipation, diarrhea, nausea and vomiting.  Musculoskeletal:  Positive for arthralgias and back pain.  Skin: Negative.   Neurological: Negative.   Psychiatric/Behavioral: Negative.      Objective:  Physical Exam Constitutional:      Appearance: She is well-developed.  HENT:     Head: Normocephalic and atraumatic.  Cardiovascular:     Rate and Rhythm: Normal rate and regular rhythm.  Pulmonary:     Effort: Pulmonary effort is normal. No respiratory distress.     Breath sounds: Normal breath sounds. No wheezing or rales.  Abdominal:     General: Bowel sounds are normal. There is no distension.     Palpations: Abdomen is soft.     Tenderness: There is no abdominal tenderness. There is no rebound.  Musculoskeletal:        General: Tenderness present.     Cervical back: Normal range of motion.  Skin:    General: Skin is warm and dry.  Neurological:     Mental Status: She is alert and oriented to person, place, and time.     Coordination: Coordination normal.     Vitals:   10/12/22 0901  BP: 120/60  Pulse: 73  Temp: 98.5 F (36.9 C)  TempSrc: Oral  SpO2: 96%  Weight: 157 lb (71.2 kg)  Height: '5\' 6"'$  (1.676 m)    Assessment & Plan:

## 2022-10-12 NOTE — Assessment & Plan Note (Signed)
Flu shot up to date. Covid-19 counseled. Pneumonia complete. Shingrix due to pharmacy. Tetanus due at pharmacy. Colonoscopy due 2026. Mammogram due 2024, pap smear aged out and dexa complete. Counseled about sun safety and mole surveillance. Counseled about the dangers of distracted driving. Given 10 year screening recommendations.

## 2022-10-12 NOTE — Assessment & Plan Note (Signed)
BP at goal on lasix 20 mg daily prn (takes rarely) and amlodipine 10 mg daily. Checking CMP and CBC and adjust as needed.

## 2022-10-19 DIAGNOSIS — Z1231 Encounter for screening mammogram for malignant neoplasm of breast: Secondary | ICD-10-CM | POA: Diagnosis not present

## 2022-10-19 LAB — HM MAMMOGRAPHY

## 2022-11-09 ENCOUNTER — Encounter: Payer: Self-pay | Admitting: Internal Medicine

## 2022-11-09 NOTE — Progress Notes (Signed)
Outside notes received. Information abstracted. Notes sent to scan. 

## 2022-11-10 DIAGNOSIS — M4316 Spondylolisthesis, lumbar region: Secondary | ICD-10-CM | POA: Diagnosis not present

## 2022-11-10 DIAGNOSIS — M5440 Lumbago with sciatica, unspecified side: Secondary | ICD-10-CM | POA: Diagnosis not present

## 2022-11-20 ENCOUNTER — Telehealth: Payer: Medicare Other | Admitting: Internal Medicine

## 2022-11-28 DIAGNOSIS — E890 Postprocedural hypothyroidism: Secondary | ICD-10-CM | POA: Diagnosis not present

## 2022-11-28 DIAGNOSIS — J302 Other seasonal allergic rhinitis: Secondary | ICD-10-CM | POA: Diagnosis not present

## 2022-11-28 DIAGNOSIS — I10 Essential (primary) hypertension: Secondary | ICD-10-CM | POA: Diagnosis not present

## 2022-11-28 DIAGNOSIS — H409 Unspecified glaucoma: Secondary | ICD-10-CM | POA: Diagnosis not present

## 2022-12-08 DIAGNOSIS — E039 Hypothyroidism, unspecified: Secondary | ICD-10-CM | POA: Diagnosis not present

## 2022-12-11 DIAGNOSIS — H401122 Primary open-angle glaucoma, left eye, moderate stage: Secondary | ICD-10-CM | POA: Diagnosis not present

## 2022-12-18 ENCOUNTER — Telehealth: Payer: Self-pay | Admitting: Family Medicine

## 2022-12-18 DIAGNOSIS — G8929 Other chronic pain: Secondary | ICD-10-CM

## 2022-12-18 DIAGNOSIS — M1712 Unilateral primary osteoarthritis, left knee: Secondary | ICD-10-CM

## 2022-12-18 NOTE — Telephone Encounter (Signed)
Pt calling to schedule Zilretta L knee, need auth for 2024. Confirmed BCBS Medicare, unchanged from 2023 per pt.

## 2022-12-18 NOTE — Telephone Encounter (Signed)
VOB initiated for Zilretta for L knee OA.

## 2022-12-19 NOTE — Telephone Encounter (Signed)
Pt is ready for scheduling on or after 12/19/22  Primary: BCBS Medicare Adv Orvan Seen co-insurance: 20% U/S guidance copay: $15  Secondary: n/a Zilretta co-insurance:  U/S guidance (20611) co-insurance:   Deductible: does not apply  Prior Auth: NOT required PA# Valid:     ** This summary of benefits is an estimation of the patient's out-of-pocket cost. Exact cost may very based on individual plan coverage.

## 2022-12-19 NOTE — Telephone Encounter (Signed)
No prior authorization, medical notes or referrals needed. Patient has a Fully Funded, Medicare advantage HMO plan with an effective date of 10/23/2022. Plan follows Medicare guidelines. Patient responsibility for [id number] Jennifer Huang) will be 20% with the remaining covered at 80% by the payer at the contracted rate. Patient is responsible for a $15 copay for CPT code 20611 with the remaining covered at 100% by the payer at contracted rate. Deductibles do not apply to these services. Patient has a $15 copay whether or not an office visit is billed. Only one copay applies per date of service. Patient has an out of pocket maximum of $3500 and has accumulated $5. If out of pocket is met, coverage goes to 100% and copays will no longer apply. Patient has a calendar year policy starting from October 23, 2022 to October 23, 2023.

## 2022-12-19 NOTE — Telephone Encounter (Signed)
Patient scheduled for 12/27/22 at 11:30am.

## 2022-12-27 ENCOUNTER — Ambulatory Visit: Payer: Self-pay

## 2022-12-27 ENCOUNTER — Ambulatory Visit: Payer: Medicare Other | Admitting: Family Medicine

## 2022-12-27 VITALS — BP 128/70 | HR 63 | Ht 66.0 in | Wt 157.0 lb

## 2022-12-27 DIAGNOSIS — M1712 Unilateral primary osteoarthritis, left knee: Secondary | ICD-10-CM

## 2022-12-27 DIAGNOSIS — M25562 Pain in left knee: Secondary | ICD-10-CM

## 2022-12-27 DIAGNOSIS — G8929 Other chronic pain: Secondary | ICD-10-CM | POA: Diagnosis not present

## 2022-12-27 MED ORDER — TRIAMCINOLONE ACETONIDE 32 MG IX SRER
32.0000 mg | Freq: Once | INTRA_ARTICULAR | Status: AC
  Administered 2022-12-27: 32 mg via INTRA_ARTICULAR

## 2022-12-27 NOTE — Progress Notes (Signed)
   I, Peterson Lombard, LAT, ATC acting as a scribe for [name], MD.  Jennifer Huang is a 72 y.o. female who presents to [place] at [facility] today for cont'd chronic L knee pain and repeat Zilretta injection. Pt was last seen by Dr. Georgina Snell on 07/28/22 and was given a left knee Zilretta injection.  Pt called the office on 12/18/22 requesting re-authorization of Zilretta. Today, pt reports L knee pain returned around the end of Jan. Pt notes her knee had been feeling pretty good. She had been doing some walking on uneven ground and thinks this may have irritated her L knee.   Dx imaging: 11/12/21 L knee MRI             08/09/21 L knee XR             03/22/21 L knee XR  Pertinent review of systems: No fevers or chills  Relevant historical information: Hypertension   Exam:  BP 128/70   Pulse 63   Ht '5\' 6"'$  (1.676 m)   Wt 157 lb (71.2 kg)   SpO2 96%   BMI 25.34 kg/m  General: Well Developed, well nourished, and in no acute distress.   MSK: Left knee: Mild effusion normal motion with crepitation.    Lab and Radiology Results  Zilretta injection left knee Procedure: Real-time Ultrasound Guided Injection of left knee superior lateral patellar space Device: Philips Affiniti 50G Images permanently stored and available for review in PACS Verbal informed consent obtained.  Discussed risks and benefits of procedure. Warned about infection, bleeding, hyperglycemia damage to structures among others. Patient expresses understanding and agreement Time-out conducted.   Noted no overlying erythema, induration, or other signs of local infection.   Skin prepped in a sterile fashion.   Local anesthesia: Topical Ethyl chloride.   With sterile technique and under real time ultrasound guidance: Zilretta 32 mg injected into knee joint. Fluid seen entering the joint capsule.   Completed without difficulty     Advised to call if fevers/chills, erythema, induration, drainage, or  persistent bleeding.   Images permanently stored and available for review in the ultrasound unit.  Impression: Technically successful ultrasound guided injection. Lot number: [id number]    Assessment and Plan: 72 y.o. female with left knee pain due to exacerbation of DJD.  Plan for Zilretta injection. Last injection was 5 months ago.  We can do this every 3 months.  Hopefully she will get good lasting benefit.  PDMP not reviewed this encounter. Orders Placed This Encounter  Procedures   [place] LIMITED JOINT SPACE STRUCTURES LOW LEFT(NO LINKED CHARGES)    Order Specific Question:   Reason for Exam (SYMPTOM  OR DIAGNOSIS REQUIRED)    Answer:   left knee pain    Order Specific Question:   Preferred imaging location?    Answer:   [facility]   Meds ordered this encounter  Medications   Triamcinolone Acetonide (ZILRETTA) intra-articular injection 32 mg     Discussed warning signs or symptoms. Please see discharge instructions. Patient expresses understanding.   The above documentation has been reviewed and is accurate and complete [name], M.D.

## 2022-12-27 NOTE — Patient Instructions (Addendum)
Thank you for coming in today.   You received an injection today. Seek immediate medical attention if the joint becomes red, extremely painful, or is oozing fluid.   Recheck with me when you need to.   I can get those back injections set up for [facility] if you need me to.

## 2022-12-27 NOTE — Telephone Encounter (Signed)
Pt received Zilretta inj for LEFT knee OA today.   Can repeat on or after 03/15/23 if needed

## 2023-01-02 DIAGNOSIS — E785 Hyperlipidemia, unspecified: Secondary | ICD-10-CM | POA: Diagnosis not present

## 2023-01-02 DIAGNOSIS — I509 Heart failure, unspecified: Secondary | ICD-10-CM | POA: Diagnosis not present

## 2023-01-02 DIAGNOSIS — M8580 Other specified disorders of bone density and structure, unspecified site: Secondary | ICD-10-CM | POA: Diagnosis not present

## 2023-01-02 DIAGNOSIS — I110 Hypertensive heart disease with heart failure: Secondary | ICD-10-CM | POA: Diagnosis not present

## 2023-01-08 ENCOUNTER — Other Ambulatory Visit: Payer: Self-pay | Admitting: Geriatric Medicine

## 2023-01-08 DIAGNOSIS — M8588 Other specified disorders of bone density and structure, other site: Secondary | ICD-10-CM

## 2023-01-23 ENCOUNTER — Ambulatory Visit: Payer: Medicare Other | Admitting: Family Medicine

## 2023-01-23 ENCOUNTER — Encounter: Payer: Self-pay | Admitting: Family Medicine

## 2023-01-23 ENCOUNTER — Ambulatory Visit (INDEPENDENT_AMBULATORY_CARE_PROVIDER_SITE_OTHER): Payer: Medicare Other

## 2023-01-23 ENCOUNTER — Ambulatory Visit: Payer: Self-pay

## 2023-01-23 VITALS — BP 130/72 | HR 61 | Ht 66.0 in | Wt 148.0 lb

## 2023-01-23 DIAGNOSIS — M25561 Pain in right knee: Secondary | ICD-10-CM

## 2023-01-23 NOTE — Patient Instructions (Addendum)
Thank you for coming in today.   You received an injection today. Seek immediate medical attention if the joint becomes red, extremely painful, or is oozing fluid.   Please get an Xray today before you leave   Please use Voltaren gel (Generic Diclofenac Gel) up to 4x daily for pain as needed.  This is available over-the-counter as both the name brand Voltaren gel and the generic diclofenac gel.   Check back in 6 weeks 

## 2023-01-23 NOTE — Progress Notes (Unsigned)
Shirlyn Goltz, PhD, LAT, ATC acting as a scribe for [name], MD.  Jennifer Huang is a 72 y.o. female who presents to [place] at [facility] today for exacerbation of her L knee pain. Pt was last seen by Dr. Georgina Snell on 12/27/22 and was given a repeat L knee Zilretta injection. Today, pt reports improvement of LEFT knee sx s/p Zilretta inj. Today, pt c/o RIGHT knee pain. Pain at medial aspect of the knee. Mechanical sx present. Sx started after twisting after hearing a noise. Denies swelling. Hx of lower leg fracture.   Dx imaging: 11/12/21 L knee MRI             08/09/21 L knee XR             03/22/21 L knee XR  Pertinent review of systems: No fevers or chills  Relevant historical information: Hypertension   Exam:  BP 130/72   Pulse 61   Ht 5\' 6"  (1.676 m)   Wt 148 lb (67.1 kg)   SpO2 96%   BMI 23.89 kg/m  General: Well Developed, well nourished, and in no acute distress.   MSK: Right knee: Mild effusion otherwise normal. Normal motion. Tender palpation medial joint line. Positive McMurray's test. Some laxity MCL stress test.  Otherwise laxity is intact.     Lab and Radiology Results  Procedure: Real-time Ultrasound Guided Injection of right knee joint superior lateral patellar space Device: Philips Affiniti 50G Images permanently stored and available for review in PACS Verbal informed consent obtained.  Discussed risks and benefits of procedure. Warned about infection, bleeding, hyperglycemia damage to structures among others. Patient expresses understanding and agreement Time-out conducted.   Noted no overlying erythema, induration, or other signs of local infection.   Skin prepped in a sterile fashion.   Local anesthesia: Topical Ethyl chloride.   With sterile technique and under real time ultrasound guidance: 40 mg of Kenalog and 2 m Marcaine injected into the knee joint. Fluid seen entering the joint capsule.   Completed without difficulty    Pain immediately resolved suggesting accurate placement of the medication.   Advised to call if fevers/chills, erythema, induration, drainage, or persistent bleeding.   Images permanently stored and available for review in the ultrasound unit.  Impression: Technically successful ultrasound guided injection.    X-ray images right knee obtained today personally and independently interpreted Moderate medial compartment DJD.  Mild patellofemoral DJD.  No acute fractures. Await formal radiology review     Assessment and Plan: 73 y.o. female with right knee pain thought to be due to exacerbation of DJD versus medial meniscus tear.  Plan for steroid injection today.  Recheck back in 6 weeks.  Return sooner if needed.   PDMP not reviewed this encounter. Orders Placed This Encounter  Procedures   [place] LIMITED JOINT SPACE STRUCTURES LOW RIGHT(NO LINKED CHARGES)    Order Specific Question:   Reason for Exam (SYMPTOM  OR DIAGNOSIS REQUIRED)    Answer:   right knee pain    Order Specific Question:   Preferred imaging location?    Answer:   [facility]   DG Knee AP/LAT W/Sun[place] Right    Standing Status:   Future    Number of Occurrences:   1    Standing Expiration Date:   02/22/2023    Order Specific Question:   Reason for Exam (SYMPTOM  OR DIAGNOSIS REQUIRED)    Answer:   right knee pain  Order Specific Question:   Preferred imaging location?    Answer:   [facility]   No orders of the defined types were placed in this encounter.    Discussed warning signs or symptoms. Please see discharge instructions. Patient expresses understanding.   The above documentation has been reviewed and is accurate and complete [name], M.D.

## 2023-01-25 NOTE — Progress Notes (Signed)
Right knee x-ray shows some medium arthritis changes.

## 2023-01-26 DIAGNOSIS — I110 Hypertensive heart disease with heart failure: Secondary | ICD-10-CM | POA: Diagnosis not present

## 2023-01-26 DIAGNOSIS — M25561 Pain in right knee: Secondary | ICD-10-CM | POA: Diagnosis not present

## 2023-01-26 DIAGNOSIS — I509 Heart failure, unspecified: Secondary | ICD-10-CM | POA: Diagnosis not present

## 2023-01-29 DIAGNOSIS — F419 Anxiety disorder, unspecified: Secondary | ICD-10-CM | POA: Diagnosis not present

## 2023-01-29 DIAGNOSIS — I10 Essential (primary) hypertension: Secondary | ICD-10-CM | POA: Diagnosis not present

## 2023-02-06 DIAGNOSIS — Z9009 Acquired absence of other part of head and neck: Secondary | ICD-10-CM | POA: Diagnosis not present

## 2023-02-06 DIAGNOSIS — E039 Hypothyroidism, unspecified: Secondary | ICD-10-CM | POA: Diagnosis not present

## 2023-02-12 DIAGNOSIS — M5440 Lumbago with sciatica, unspecified side: Secondary | ICD-10-CM | POA: Diagnosis not present

## 2023-02-13 DIAGNOSIS — E039 Hypothyroidism, unspecified: Secondary | ICD-10-CM | POA: Diagnosis not present

## 2023-02-13 DIAGNOSIS — I10 Essential (primary) hypertension: Secondary | ICD-10-CM | POA: Diagnosis not present

## 2023-02-13 DIAGNOSIS — Z9009 Acquired absence of other part of head and neck: Secondary | ICD-10-CM | POA: Diagnosis not present

## 2023-02-21 NOTE — Telephone Encounter (Signed)
VOB initiated for Zilretta for LEFT knee OA 

## 2023-02-23 NOTE — Telephone Encounter (Signed)
Zilretta for LEFT knee OA  Primary insurance: BCBS [place] Medicare Adv HMO Co-Pay: $15 Co-Insurance: 20% Deductible: does not apply  Prior Auth: NOT required   Last Zilretta inj 12/27/22, can consider repeat on or after 03/22/23

## 2023-02-27 NOTE — Telephone Encounter (Signed)
Appointment scheduled.

## 2023-02-28 DIAGNOSIS — M5440 Lumbago with sciatica, unspecified side: Secondary | ICD-10-CM | POA: Diagnosis not present

## 2023-03-06 ENCOUNTER — Ambulatory Visit: Payer: Medicare Other | Admitting: Family Medicine

## 2023-03-12 DIAGNOSIS — R002 Palpitations: Secondary | ICD-10-CM | POA: Diagnosis not present

## 2023-03-12 DIAGNOSIS — L989 Disorder of the skin and subcutaneous tissue, unspecified: Secondary | ICD-10-CM | POA: Diagnosis not present

## 2023-03-12 DIAGNOSIS — I10 Essential (primary) hypertension: Secondary | ICD-10-CM | POA: Diagnosis not present

## 2023-03-13 DIAGNOSIS — M5440 Lumbago with sciatica, unspecified side: Secondary | ICD-10-CM | POA: Diagnosis not present

## 2023-03-22 ENCOUNTER — Ambulatory Visit: Payer: Medicare Other | Admitting: Family Medicine

## 2023-03-22 DIAGNOSIS — R002 Palpitations: Secondary | ICD-10-CM | POA: Diagnosis not present

## 2023-03-28 ENCOUNTER — Telehealth: Payer: Self-pay | Admitting: Family Medicine

## 2023-03-28 NOTE — Telephone Encounter (Signed)
Pt will contact us to schedule when ready.

## 2023-03-28 NOTE — Telephone Encounter (Signed)
Pt had to cancel her appt for L knee injection as she recently had steroids in her back and her other provider wants her to hold off a bit.  BUT, pt would like [place] to get future approval for her R knee to have these injections.

## 2023-03-28 NOTE — Telephone Encounter (Signed)
Zilretta for RIGHT knee OA   Primary insurance: BCBS [place] Medicare Adv HMO Co-Pay: $15 Co-Insurance: 20% Deductible: does not apply   Prior Auth: NOT required

## 2023-03-28 NOTE — Telephone Encounter (Signed)
You  Morphies, Hermine Messick; Letitia Libra, Raynelle Fanning RJust now (12:25 PM)    Arletta Bale for RIGHT knee OA   Primary insurance: BCBS [place] Medicare Adv HMO Co-Pay: $15 Co-Insurance: 20% Deductible: does not apply   Prior Auth: NOT required           Note   Fayrene Fearing routed conversation to You1 hour ago (11:19 AM)   Elray Mcgregor R1 hour ago (11:19 AM)   Lennon Alstrom Pt had to cancel her appt for L knee injection as she recently had steroids in her back and her other provider wants her to hold off a bit.   BUT, pt would like [place] to get future approval for her R knee to have these injections.

## 2023-04-02 ENCOUNTER — Ambulatory Visit: Payer: Medicare Other | Admitting: Family Medicine

## 2023-04-05 DIAGNOSIS — R002 Palpitations: Secondary | ICD-10-CM | POA: Diagnosis not present

## 2023-04-05 DIAGNOSIS — E785 Hyperlipidemia, unspecified: Secondary | ICD-10-CM | POA: Diagnosis not present

## 2023-04-05 DIAGNOSIS — F419 Anxiety disorder, unspecified: Secondary | ICD-10-CM | POA: Diagnosis not present

## 2023-04-12 DIAGNOSIS — H401122 Primary open-angle glaucoma, left eye, moderate stage: Secondary | ICD-10-CM | POA: Diagnosis not present

## 2023-04-19 DIAGNOSIS — K080 Exfoliation of teeth due to systemic causes: Secondary | ICD-10-CM | POA: Diagnosis not present

## 2023-04-20 DIAGNOSIS — K080 Exfoliation of teeth due to systemic causes: Secondary | ICD-10-CM | POA: Diagnosis not present

## 2023-04-24 NOTE — Progress Notes (Signed)
   Rubin Payor, PhD, LAT, ATC acting as a scribe for Jennifer Graham, MD.  CHARLISSA Huang is a 72 y.o. female who presents to [facility] at [facility] today for bilat knee and low back pain. Pt was last seen by Dr. Denyse Huang on 01/23/23 and was given a R knee steroid injection. She had to cancel her f/u appointment in June due to recently having "steroid injections in her back."  Today, pt reports ***.  She also c/o low back pain x ***. She has been seen previously for her LBP at [facility] by Dr. Verdie Mosher. Last lumbar ESI was performed on 02/28/23. Pt locates pain to ***  Radiating pain: LE numbness/tingling: LE weakness: Aggravates: Treatments tried:  Dx imaging: 01/23/23 R knee XR 11/12/21 L knee MRI             08/09/21 L knee XR             03/22/21 L knee XR  12/05/20 L-spine MRI  Pertinent review of systems: ***  Relevant historical information: ***   Exam:  There were no vitals taken for this visit. General: Well Developed, well nourished, and in no acute distress.   MSK: ***    Lab and Radiology Results No results found for this or any previous visit (from the past 72 hour(s)). No results found.     Assessment and Plan: 72 y.o. female with ***   PDMP not reviewed this encounter. No orders of the defined types were placed in this encounter.  No orders of the defined types were placed in this encounter.    Discussed warning signs or symptoms. Please see discharge instructions. Patient expresses understanding.   ***

## 2023-04-25 ENCOUNTER — Encounter: Payer: Self-pay | Admitting: Family Medicine

## 2023-04-25 ENCOUNTER — Other Ambulatory Visit: Payer: Self-pay

## 2023-04-25 ENCOUNTER — Ambulatory Visit: Payer: Medicare Other | Admitting: Family Medicine

## 2023-04-25 VITALS — BP 122/72 | HR 72 | Ht 66.0 in | Wt 142.8 lb

## 2023-04-25 DIAGNOSIS — M1712 Unilateral primary osteoarthritis, left knee: Secondary | ICD-10-CM

## 2023-04-25 DIAGNOSIS — M5416 Radiculopathy, lumbar region: Secondary | ICD-10-CM

## 2023-04-25 DIAGNOSIS — M25562 Pain in left knee: Secondary | ICD-10-CM | POA: Diagnosis not present

## 2023-04-25 DIAGNOSIS — G8929 Other chronic pain: Secondary | ICD-10-CM | POA: Diagnosis not present

## 2023-04-25 MED ORDER — TRIAMCINOLONE ACETONIDE 32 MG IX SRER
32.0000 mg | Freq: Once | INTRA_ARTICULAR | Status: AC
Start: 2023-04-25 — End: 2023-04-25
  Administered 2023-04-25: 32 mg via INTRA_ARTICULAR

## 2023-04-25 MED ORDER — GABAPENTIN 100 MG PO CAPS
100.0000 mg | ORAL_CAPSULE | Freq: Every evening | ORAL | 2 refills | Status: AC | PRN
Start: 2023-04-25 — End: ?

## 2023-04-25 MED ORDER — GABAPENTIN 100 MG PO CAPS: 100.0000 mg | ORAL_CAPSULE | Freq: Three times a day (TID) | ORAL | 2 refills | Status: DC

## 2023-04-25 MED ORDER — PREDNISONE 50 MG PO TABS: 50.0000 mg | ORAL_TABLET | Freq: Every day | ORAL | 0 refills | Status: AC

## 2023-04-25 NOTE — Patient Instructions (Signed)
Thank you for coming in today.   You received an injection today. Seek immediate medical attention if the joint becomes red, extremely painful, or is oozing fluid.   I've sent a prescription for prednisone and gabapentin to your pharmacy.

## 2023-04-25 NOTE — Telephone Encounter (Signed)
Pt received Zilretta injections for LEFT knee OA on 04/25/23 Can consider repeat injection on or after 07/19/23

## 2023-05-01 ENCOUNTER — Other Ambulatory Visit: Payer: Self-pay | Admitting: Physical Medicine and Rehabilitation

## 2023-05-01 DIAGNOSIS — M5441 Lumbago with sciatica, right side: Secondary | ICD-10-CM

## 2023-05-02 DIAGNOSIS — K080 Exfoliation of teeth due to systemic causes: Secondary | ICD-10-CM | POA: Diagnosis not present

## 2023-05-11 ENCOUNTER — Ambulatory Visit
Admission: RE | Admit: 2023-05-11 | Discharge: 2023-05-11 | Disposition: A | Discharge status: Home / Self Care | Payer: Medicare Other | Source: Ambulatory Visit | Attending: Physical Medicine and Rehabilitation | Admitting: Physical Medicine and Rehabilitation

## 2023-05-11 DIAGNOSIS — M5416 Radiculopathy, lumbar region: Secondary | ICD-10-CM | POA: Diagnosis not present

## 2023-05-11 DIAGNOSIS — M5441 Lumbago with sciatica, right side: Secondary | ICD-10-CM

## 2023-05-22 DIAGNOSIS — K080 Exfoliation of teeth due to systemic causes: Secondary | ICD-10-CM | POA: Diagnosis not present

## 2023-06-18 DIAGNOSIS — M5116 Intervertebral disc disorders with radiculopathy, lumbar region: Secondary | ICD-10-CM | POA: Diagnosis not present

## 2023-06-27 ENCOUNTER — Other Ambulatory Visit: Payer: Self-pay | Admitting: Nurse Practitioner

## 2023-06-27 DIAGNOSIS — E890 Postprocedural hypothyroidism: Secondary | ICD-10-CM

## 2023-06-28 NOTE — Telephone Encounter (Signed)
VOB initiated for ZILRETTA for LEFT knee OA

## 2023-07-02 NOTE — Telephone Encounter (Signed)
Holding until needed.

## 2023-07-02 NOTE — Telephone Encounter (Signed)
No Prior Auth required for [employer]

## 2023-07-02 NOTE — Telephone Encounter (Signed)
Zilretta for BILAT knee OA OK to schedule on or after 07/19/23   Primary insurance: BCBS [place] Medicare Adv HMO Co-Pay: $15 Co-Insurance: 20% Deductible: does not apply   Prior Auth: NOT required   Knee Injection Schedule: 02/02/20 - Kenalog LEFT 06/11/20 - Durolane LEFT 06/15/20 - Kenalog LEFT 08/18/20 - Kenalog LEFT 01/25/21 - Kenalog LEFT 04/08/21 - Synvisc-One LEFT 08/09/21 - Kenalog LEFT 10/12/21 - Synvisc-One LEFT 11/17/21 - Kenalog LEFT 03/14/22 - Kenalog LEFT 07/28/22 - Zilretta LEFT 12/27/22 - Zilretta LEFT 01/23/23 - Kenalog RIGHT 04/25/23 - Zilretta LEFT

## 2023-07-04 DIAGNOSIS — J302 Other seasonal allergic rhinitis: Secondary | ICD-10-CM | POA: Diagnosis not present

## 2023-07-04 DIAGNOSIS — I110 Hypertensive heart disease with heart failure: Secondary | ICD-10-CM | POA: Diagnosis not present

## 2023-07-04 DIAGNOSIS — R002 Palpitations: Secondary | ICD-10-CM | POA: Diagnosis not present

## 2023-07-04 DIAGNOSIS — E785 Hyperlipidemia, unspecified: Secondary | ICD-10-CM | POA: Diagnosis not present

## 2023-07-04 DIAGNOSIS — I509 Heart failure, unspecified: Secondary | ICD-10-CM | POA: Diagnosis not present

## 2023-07-04 DIAGNOSIS — F419 Anxiety disorder, unspecified: Secondary | ICD-10-CM | POA: Diagnosis not present

## 2023-07-05 ENCOUNTER — Ambulatory Visit
Admission: RE | Admit: 2023-07-05 | Discharge: 2023-07-05 | Disposition: A | Discharge status: Home / Self Care | Payer: Medicare Other | Source: Ambulatory Visit | Attending: Nurse Practitioner | Admitting: Nurse Practitioner

## 2023-07-05 DIAGNOSIS — E041 Nontoxic single thyroid nodule: Secondary | ICD-10-CM | POA: Diagnosis not present

## 2023-07-05 DIAGNOSIS — E890 Postprocedural hypothyroidism: Secondary | ICD-10-CM

## 2023-07-11 DIAGNOSIS — M5116 Intervertebral disc disorders with radiculopathy, lumbar region: Secondary | ICD-10-CM | POA: Diagnosis not present

## 2023-07-13 ENCOUNTER — Ambulatory Visit
Admission: RE | Admit: 2023-07-13 | Discharge: 2023-07-13 | Disposition: A | Discharge status: Home / Self Care | Payer: Medicare Other | Source: Ambulatory Visit | Attending: Geriatric Medicine | Admitting: Geriatric Medicine

## 2023-07-13 DIAGNOSIS — M8588 Other specified disorders of bone density and structure, other site: Secondary | ICD-10-CM

## 2023-07-17 ENCOUNTER — Ambulatory Visit: Payer: Medicare Other | Admitting: Dermatology

## 2023-07-17 ENCOUNTER — Encounter: Payer: Self-pay | Admitting: Dermatology

## 2023-07-17 VITALS — BP 124/82

## 2023-07-17 DIAGNOSIS — C4442 Squamous cell carcinoma of skin of scalp and neck: Secondary | ICD-10-CM | POA: Diagnosis not present

## 2023-07-17 DIAGNOSIS — D492 Neoplasm of unspecified behavior of bone, soft tissue, and skin: Secondary | ICD-10-CM | POA: Diagnosis not present

## 2023-07-17 DIAGNOSIS — D485 Neoplasm of uncertain behavior of skin: Secondary | ICD-10-CM

## 2023-07-17 DIAGNOSIS — C44329 Squamous cell carcinoma of skin of other parts of face: Secondary | ICD-10-CM | POA: Diagnosis not present

## 2023-07-17 DIAGNOSIS — C4492 Squamous cell carcinoma of skin, unspecified: Secondary | ICD-10-CM

## 2023-07-17 HISTORY — DX: Squamous cell carcinoma of skin, unspecified: C44.92

## 2023-07-17 NOTE — Progress Notes (Signed)
   New Patient Visit   Subjective  Jennifer Huang is a 72 y.o. female who presents for the following: She has a spot behind right ear that has been there since the pandemic. She has used OTC products but it won't go away.  The following portions of the chart were reviewed this encounter and updated as appropriate: medications, allergies, medical history  Review of Systems:  No other skin or systemic complaints except as noted in HPI or Assessment and Plan.  Objective  Well appearing patient in no apparent distress; mood and affect are within normal limits.   A focused examination was performed of the following areas: face, scalp  Relevant exam findings are noted in the Assessment and Plan.  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  Skin / nail biopsy Type of biopsy: tangential   Informed consent: discussed and consent obtained   Timeout: patient name, date of birth, surgical site, and procedure verified   Procedure prep:  Patient was prepped and draped in usual sterile fashion Prep type:  Isopropyl alcohol Anesthesia: the lesion was anesthetized in a standard fashion   Anesthetic:  1% lidocaine w/ epinephrine 1-100,000 buffered w/ 8.4% NaHCO3 Instrument used: DermaBlade   Hemostasis achieved with: aluminum chloride   Outcome: patient tolerated procedure well   Post-procedure details: sterile dressing applied and wound care instructions given   Dressing type: petrolatum gauze and bandage    Specimen 1 - Surgical pathology Differential Diagnosis: BCC vs SCC  Check Margins: No  Return in about 2 months (around 09/16/2023) for TBSE.   Documentation: I have reviewed the above documentation for accuracy and completeness, and I agree with the above.  Stasia Cavalier, am acting as scribe for Langston Reusing, DO.  Langston Reusing, DO

## 2023-07-17 NOTE — Patient Instructions (Signed)

## 2023-07-18 ENCOUNTER — Telehealth: Payer: Self-pay | Admitting: Family Medicine

## 2023-07-18 DIAGNOSIS — Z780 Asymptomatic menopausal state: Secondary | ICD-10-CM

## 2023-07-18 DIAGNOSIS — Z13820 Encounter for screening for osteoporosis: Secondary | ICD-10-CM

## 2023-07-18 LAB — SURGICAL PATHOLOGY

## 2023-07-18 NOTE — Telephone Encounter (Signed)
Pt is due for her DEXA and she wants to get it done at [facility]. Her PCP is not [place], pt requesting Dr. Denyse Amass put in the DEXA order for her.

## 2023-07-18 NOTE — Telephone Encounter (Signed)
Order placed for DEXA at [facility].

## 2023-07-18 NOTE — Telephone Encounter (Signed)
Pt informed, will schedule at [facility].

## 2023-07-19 ENCOUNTER — Ambulatory Visit (INDEPENDENT_AMBULATORY_CARE_PROVIDER_SITE_OTHER)
Admission: RE | Admit: 2023-07-19 | Discharge: 2023-07-19 | Disposition: A | Discharge status: Home / Self Care | Payer: Medicare Other | Source: Ambulatory Visit | Attending: Family Medicine | Admitting: Family Medicine

## 2023-07-19 DIAGNOSIS — Z780 Asymptomatic menopausal state: Secondary | ICD-10-CM

## 2023-07-19 DIAGNOSIS — Z13820 Encounter for screening for osteoporosis: Secondary | ICD-10-CM

## 2023-07-20 DIAGNOSIS — Z780 Asymptomatic menopausal state: Secondary | ICD-10-CM | POA: Diagnosis not present

## 2023-07-23 NOTE — Progress Notes (Signed)
Bone density has decreased.  You are at the point where medication does make sense if you are not already taking it.  Recommend return to my clinic on or after October 3.  That way we could repeat that Zilretta injection if we needed to.  Will need to make sure we get an reauthorized and get the medicine ordered and for Zilretta for the knee if needed.

## 2023-07-25 DIAGNOSIS — G509 Disorder of trigeminal nerve, unspecified: Secondary | ICD-10-CM | POA: Diagnosis not present

## 2023-07-25 DIAGNOSIS — M8580 Other specified disorders of bone density and structure, unspecified site: Secondary | ICD-10-CM | POA: Diagnosis not present

## 2023-07-25 DIAGNOSIS — L989 Disorder of the skin and subcutaneous tissue, unspecified: Secondary | ICD-10-CM | POA: Diagnosis not present

## 2023-07-25 DIAGNOSIS — Z8781 Personal history of (healed) traumatic fracture: Secondary | ICD-10-CM | POA: Diagnosis not present

## 2023-07-26 NOTE — Telephone Encounter (Signed)
Patient asked what time frame she would need to wait between flu, covid, pneumonia injections and the Zilretta injections?   Forwarding to Dr. Denyse Amass to advise.

## 2023-07-26 NOTE — Telephone Encounter (Signed)
I think given it a week between the vaccines and the Evenity injection is probably a good idea

## 2023-07-26 NOTE — Telephone Encounter (Signed)
Patient will call back to schedule. ** Please confirm/allow time for ordering supplies.  Patient asked what time frame she would need to wait between flu, covid, pneumonia injections and the Zilretta injections?

## 2023-07-26 NOTE — Telephone Encounter (Signed)
Per Dr. Denyse Amass:  Denyse Amass Michel Harrow, MD  Dierdre Searles, CMA Please re Mason Jim. Looks like we could do it on or after OCt 3  See benefits below

## 2023-07-27 NOTE — Telephone Encounter (Signed)
Advised pt per Dr. Denyse Amass:  "I think given it a week between the vaccines and the Evenity injection is probably a good idea "  Pt will call back to schedule.  Zilretta for BILAT knee OA OK to schedule on or after 07/19/23   Primary insurance: BCBS [place] Medicare Adv HMO Co-Pay: $15 Co-Insurance: 20% Deductible: does not apply   Prior Auth: NOT required     Knee Injection Schedule: 02/02/20 - Kenalog LEFT 06/11/20 - Durolane LEFT 06/15/20 - Kenalog LEFT 08/18/20 - Kenalog LEFT 01/25/21 - Kenalog LEFT 04/08/21 - Synvisc-One LEFT 08/09/21 - Kenalog LEFT 10/12/21 - Synvisc-One LEFT 11/17/21 - Kenalog LEFT 03/14/22 - Kenalog LEFT 07/28/22 - Zilretta LEFT 12/27/22 - Zilretta LEFT 01/23/23 - Kenalog RIGHT 04/25/23 - Zilretta LEFT

## 2023-08-01 DIAGNOSIS — K080 Exfoliation of teeth due to systemic causes: Secondary | ICD-10-CM | POA: Diagnosis not present

## 2023-08-03 NOTE — Progress Notes (Signed)
I, Jennifer Huang, CMA acting as a scribe for Jennifer Graham, MD.  Jennifer Huang is a 72 y.o. female who presents to [facility] at [facility] today for cont'd L knee pain and to review her DEXA scan. Pt was last seen by Dr. Denyse Amass on 04/25/23 and was given a L knee Zilretta injection.  Today, pt reports continued right knee pain, worse in the mornings. Notes being slow to start in the mornings. Notes that her dog ran into her right knee about 1 week ago. Visible swelling present. Feels unsteady at times. Clicking with ambulation. Difficulty ambulating over uneven terrain. Using ice/heat and elevating. Denies falls or fractures since last visit.   Dx testing: 07/19/23 DEXA scan 11/12/21 L knee MRI             08/09/21 L knee XR  Pertinent review of systems: No fevers or chills  Relevant historical information: Hypertension   Exam:  BP 102/64   Pulse 66   Ht 5\' 6"  (1.676 m)   Wt 143 lb (64.9 kg)   SpO2 99%   BMI 23.08 kg/m  General: Well Developed, well nourished, and in no acute distress.   MSK: Right knee mild effusion normal motion with crepitation.    Lab and Radiology Results   Zilretta injection right knee Procedure: Real-time Ultrasound Guided Injection of right knee joint superior lateral patellar space Device: Philips Affiniti 50G Images permanently stored and available for review in PACS Verbal informed consent obtained.  Discussed risks and benefits of procedure. Warned about infection, hyperglycemia bleeding, damage to structures among others. Patient expresses understanding and agreement Time-out conducted.   Noted no overlying erythema, induration, or other signs of local infection.   Skin prepped in a sterile fashion.   Local anesthesia: Topical Ethyl chloride.   With sterile technique and under real time ultrasound guidance: Zilretta 32 mg injected into knee joint. Fluid seen entering the joint capsule.   Completed without difficulty    Advised to call if fevers/chills, erythema, induration, drainage, or persistent bleeding.   Images permanently stored and available for review in the ultrasound unit.  Impression: Technically successful ultrasound guided injection.  Lot number: [id number]   Narrative & Impression  Date of study: 07/19/2023 Exam: DUAL X-RAY ABSORPTIOMETRY (DXA) FOR BONE MINERAL DENSITY (BMD) Instrument: [employer] Requesting Provider: PCP Indication: follow up for low BMD Comparison: 2020 Clinical data: Pt is a 72 y.o. female with previous history of fracture.   Results:   Lumbar spine L1-L4 Femoral neck (FN)  T-score -2.4 RFN: -1.3 LFN: -1.8  Change in BMD from previous DXA test (%) Down 10.8%* Down 7.8%*  (*) statistically significant   Assessment: By the [facility] Criteria for diagnosis based on bone density, this patient has Low Bone Density        FRAX 10-year fracture risk calculator: 17.1 % for any major fracture and 3.3 % for hip fracture. Pharmacologic therapy is recommended if 10 year fracture risk is >20% for any major osteoporotic fracture or >3% for hip fracture.       Comments: the technical quality of the study is good.   WHO criteria for diagnosis of osteoporosis in postmenopausal women and in men 58 y/o or older:  - normal: T-score -1.0 to + 1.0 - osteopenia/low bone density: T-score between -2.5 and -1.0 - osteoporosis: T-score below -2.5 - severe osteoporosis: T-score below -2.5 with history of fragility fracture Note: although not part of the WHO classification, the presence  of a fragility fracture, regardless of the T-score, should be considered diagnostic of osteoporosis, provided other causes for the fracture have been excluded.     RECOMMENDATION: 1. All patients should optimize calcium and vitamin D intake. 2. Consider FDA-approved medical therapies in postmenopausal women and men aged 53 years and older, based on the following: a. A hip or vertebral(clinical or  morphometric) fracture. b. T-Score of  -2.5 or less at the femoral neck , total hip or spine after appropriate evaluation to exclude secondary causes c. Low bone mass (T-score between -1.0 and -2.5 at the femoral neck or spine) and a 10 year probability of a hip fracture >3% or a 10 year probability of major osteoporosis-related fracture > 20% based on the [place]-adapted WHO algorithm d. Clinical judgement and/or patient preferences may indicate treatment for people with 10-year fracture probabilities above or below these levels     Follow up BMD is recommended: 2 years      Assessment and Plan: 72 y.o. female with right knee pain due to exacerbation of DJD.  Plan for Zilretta injection today.  Previous injection was about 6 months ago.  Osteopenia.  T-score -2.4 lumbar spine.  This is worsened significantly since the last check.  She is already taking vitamin D and calcium.  Will check these levels with a metabolic panel and vitamin D today.  Will start alendronate after discussion.  Recheck bone density in 2 years.   PDMP not reviewed this encounter. Orders Placed This Encounter  Procedures   [place] LIMITED JOINT SPACE STRUCTURES LOW RIGHT(NO LINKED CHARGES)    Order Specific Question:   Reason for Exam (SYMPTOM  OR DIAGNOSIS REQUIRED)    Answer:   right knee OA    Order Specific Question:   Preferred imaging location?    Answer:   [facility]   Comprehensive metabolic panel    Standing Status:   Future    Number of Occurrences:   1    Standing Expiration Date:   08/05/2024   VITAMIN D 25 Hydroxy (Vit-D Deficiency, Fractures)    Standing Status:   Future    Number of Occurrences:   1    Standing Expiration Date:   08/05/2024   Meds ordered this encounter  Medications   DISCONTD: alendronate (FOSAMAX) 70 MG tablet    Sig: Take 1 tablet (70 mg total) by mouth every 7 (seven) days.    Dispense:  4 tablet    Refill:  11   alendronate (FOSAMAX) 70 MG tablet     Sig: Take 1 tablet (70 mg total) by mouth every 7 (seven) days.    Dispense:  12 tablet    Refill:  11   Triamcinolone Acetonide (ZILRETTA) intra-articular injection 32 mg   AMBULATORY NON FORMULARY MEDICATION    Sig: Orthotic R shoe to correct leg length discrepancy.  Dispense 1 Dx code: M21.70 Use as needed    Dispense:  1 Product    Refill:  0     Discussed warning signs or symptoms. Please see discharge instructions. Patient expresses understanding.   The above documentation has been reviewed and is accurate and complete Jennifer Huang, M.D.

## 2023-08-06 ENCOUNTER — Encounter: Payer: Self-pay | Admitting: Family Medicine

## 2023-08-06 ENCOUNTER — Other Ambulatory Visit: Payer: Self-pay

## 2023-08-06 ENCOUNTER — Ambulatory Visit: Payer: Medicare Other | Admitting: Family Medicine

## 2023-08-06 VITALS — BP 102/64 | HR 66 | Ht 66.0 in | Wt 143.0 lb

## 2023-08-06 DIAGNOSIS — M1711 Unilateral primary osteoarthritis, right knee: Secondary | ICD-10-CM | POA: Diagnosis not present

## 2023-08-06 DIAGNOSIS — G8929 Other chronic pain: Secondary | ICD-10-CM

## 2023-08-06 DIAGNOSIS — M25561 Pain in right knee: Secondary | ICD-10-CM | POA: Diagnosis not present

## 2023-08-06 DIAGNOSIS — M8588 Other specified disorders of bone density and structure, other site: Secondary | ICD-10-CM | POA: Diagnosis not present

## 2023-08-06 DIAGNOSIS — M2170 Unequal limb length (acquired), unspecified site: Secondary | ICD-10-CM

## 2023-08-06 LAB — COMPREHENSIVE METABOLIC PANEL
ALT: 15 U/L (ref 0–35)
AST: 19 U/L (ref 0–37)
Albumin: 4.3 g/dL (ref 3.5–5.2)
Alkaline Phosphatase: 57 U/L (ref 39–117)
BUN: 22 mg/dL (ref 6–23)
CO2: 29 meq/L (ref 19–32)
Calcium: 9.8 mg/dL (ref 8.4–10.5)
Chloride: 104 meq/L (ref 96–112)
Creatinine, Ser: 0.79 mg/dL (ref 0.40–1.20)
GFR: 74.63 mL/min (ref >60.00)
Glucose, Bld: 93 mg/dL (ref 70–99)
Potassium: 3.8 meq/L (ref 3.5–5.1)
Sodium: 139 meq/L (ref 135–145)
Total Bilirubin: 0.9 mg/dL (ref 0.2–1.2)
Total Protein: 6.9 g/dL (ref 6.0–8.3)

## 2023-08-06 MED ORDER — ALENDRONATE SODIUM 70 MG PO TABS: 70.0000 mg | ORAL_TABLET | ORAL | 11 refills | Status: DC

## 2023-08-06 MED ORDER — AMBULATORY NON FORMULARY MEDICATION
0 refills | Status: AC
Start: 2023-08-06 — End: ?

## 2023-08-06 MED ORDER — TRIAMCINOLONE ACETONIDE 32 MG IX SRER
32.0000 mg | Freq: Once | INTRA_ARTICULAR | Status: AC
Start: 2023-08-06 — End: 2023-08-06
  Administered 2023-08-06: 32 mg via INTRA_ARTICULAR

## 2023-08-06 NOTE — Patient Instructions (Signed)
Thank you for coming in today.   Please get labs today before you leave   Let me know how this goes.

## 2023-08-07 DIAGNOSIS — M8580 Other specified disorders of bone density and structure, unspecified site: Secondary | ICD-10-CM | POA: Diagnosis not present

## 2023-08-07 DIAGNOSIS — G588 Other specified mononeuropathies: Secondary | ICD-10-CM | POA: Diagnosis not present

## 2023-08-07 DIAGNOSIS — G508 Other disorders of trigeminal nerve: Secondary | ICD-10-CM | POA: Diagnosis not present

## 2023-08-07 DIAGNOSIS — Z970 Presence of artificial eye: Secondary | ICD-10-CM | POA: Diagnosis not present

## 2023-08-07 LAB — VITAMIN D 25 HYDROXY (VIT D DEFICIENCY, FRACTURES): Vit D, 25-Hydroxy: 33 ng/mL (ref 30–100)

## 2023-08-08 DIAGNOSIS — E039 Hypothyroidism, unspecified: Secondary | ICD-10-CM | POA: Diagnosis not present

## 2023-08-08 NOTE — Progress Notes (Signed)
Vitamin D and calcium and other minerals and electrolytes look okay.

## 2023-08-10 ENCOUNTER — Telehealth: Payer: Self-pay | Admitting: Family Medicine

## 2023-08-10 NOTE — Telephone Encounter (Signed)
Pt returned call about labs, given Dr. Zollie Pee result note. Just confirming that pt should NOT change any vitamins she is already taking based on these results.

## 2023-08-13 NOTE — Telephone Encounter (Signed)
Called pt and advised no changes to meds at this time. Pt verbalized understanding.

## 2023-08-13 NOTE — Telephone Encounter (Signed)
Rodolph Bong, MD 08/08/2023  6:50 AM EDT     Vitamin D and calcium and other minerals and electrolytes look okay.

## 2023-08-14 DIAGNOSIS — H1045 Other chronic allergic conjunctivitis: Secondary | ICD-10-CM | POA: Diagnosis not present

## 2023-08-14 DIAGNOSIS — J300 Vasomotor rhinitis: Secondary | ICD-10-CM | POA: Diagnosis not present

## 2023-08-14 DIAGNOSIS — Z91030 Bee allergy status: Secondary | ICD-10-CM | POA: Diagnosis not present

## 2023-08-15 DIAGNOSIS — Z9009 Acquired absence of other part of head and neck: Secondary | ICD-10-CM | POA: Diagnosis not present

## 2023-08-15 DIAGNOSIS — E039 Hypothyroidism, unspecified: Secondary | ICD-10-CM | POA: Diagnosis not present

## 2023-08-15 DIAGNOSIS — M810 Age-related osteoporosis without current pathological fracture: Secondary | ICD-10-CM | POA: Diagnosis not present

## 2023-08-22 DIAGNOSIS — S0571XD Avulsion of right eye, subsequent encounter: Secondary | ICD-10-CM | POA: Diagnosis not present

## 2023-08-27 NOTE — Telephone Encounter (Signed)
Pt received Zilretta inj for RIGHT knee OA 08/06/23. Can consider repeat inj on or after 10/30/23.

## 2023-08-30 ENCOUNTER — Telehealth: Payer: Self-pay | Admitting: Dermatology

## 2023-08-30 NOTE — Telephone Encounter (Signed)
Pt has been informed of results and expressed understanding.       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 In areas, there are histologic changes, which resemble those seen in a verruca. There is scale crust present.       Previous Messages    ----- Message ----- From: Jennifer Huang, CMA Sent: 08/27/2023   1:22 PM EST To: Jennifer Piedra, DO  Biopsy was done on 07/17/23. Please advise. ----- Message ----- From: Jennifer Huang Sent: 08/27/2023  12:31 PM EST To: [facility]  Patient reached out to ask if we have gotten any results from her biopsy, if so can someone call her to go over them with her.  Best number is [phone]  Thank you, Mo

## 2023-09-13 DIAGNOSIS — H401122 Primary open-angle glaucoma, left eye, moderate stage: Secondary | ICD-10-CM | POA: Diagnosis not present

## 2023-09-13 DIAGNOSIS — H2512 Age-related nuclear cataract, left eye: Secondary | ICD-10-CM | POA: Diagnosis not present

## 2023-09-17 ENCOUNTER — Ambulatory Visit: Payer: Medicare Other | Admitting: Dermatology

## 2023-09-17 ENCOUNTER — Encounter: Payer: Self-pay | Admitting: Dermatology

## 2023-09-17 VITALS — BP 121/74 | HR 67

## 2023-09-17 DIAGNOSIS — Z1283 Encounter for screening for malignant neoplasm of skin: Secondary | ICD-10-CM | POA: Diagnosis not present

## 2023-09-17 DIAGNOSIS — W908XXA Exposure to other nonionizing radiation, initial encounter: Secondary | ICD-10-CM | POA: Diagnosis not present

## 2023-09-17 DIAGNOSIS — D2371 Other benign neoplasm of skin of right lower limb, including hip: Secondary | ICD-10-CM

## 2023-09-17 DIAGNOSIS — D1801 Hemangioma of skin and subcutaneous tissue: Secondary | ICD-10-CM

## 2023-09-17 DIAGNOSIS — D229 Melanocytic nevi, unspecified: Secondary | ICD-10-CM

## 2023-09-17 DIAGNOSIS — L578 Other skin changes due to chronic exposure to nonionizing radiation: Secondary | ICD-10-CM

## 2023-09-17 DIAGNOSIS — L814 Other melanin hyperpigmentation: Secondary | ICD-10-CM

## 2023-09-17 DIAGNOSIS — D239 Other benign neoplasm of skin, unspecified: Secondary | ICD-10-CM

## 2023-09-17 DIAGNOSIS — L821 Other seborrheic keratosis: Secondary | ICD-10-CM

## 2023-09-17 DIAGNOSIS — Q825 Congenital non-neoplastic nevus: Secondary | ICD-10-CM

## 2023-09-17 NOTE — Patient Instructions (Addendum)
Hello [name],  Thank you for visiting [place] today. Your dedication to addressing your dermatological concerns and improving your health is greatly appreciated. Below is a summary of the essential instructions and next steps from today's appointment:  - Surgical Removal: You are scheduled for surgery to remove the skin cancer spot located behind your right ear. Dr. Caralyn Guile will oversee the procedure to ensure complete removal of all cancerous tissue. Please be prepared for the process to take a couple of hours and consider bringing something to keep you occupied.   - Annual Check-Ups: In light of your skin cancer history, we strongly recommend scheduling annual dermatological exams. Should you notice any new or unhealing spots, please schedule an appointment earlier.  - Skin Care: It is important to keep your skin hydrated, especially during the winter months. We have provided a goodie bag with moisturizers for you to try.  - Follow-Up Appointment: Ensure you schedule your surgery with Dr. Caralyn Guile before leaving today. Clydie Braun at the front desk will assist you with the scheduling.  We look forward to your next visit and wish you a healthy recovery and a joyful holiday season.  Warm regards,  Dr. [facility]   Important Information   Due to recent changes in healthcare laws, you may see results of your pathology and/or laboratory studies on MyChart before the doctors have had a chance to review them. We understand that in some cases there may be results that are confusing or concerning to you. Please understand that not all results are received at the same time and often the doctors may need to interpret multiple results in order to provide you with the best plan of care or course of treatment. Therefore, we ask that you please give [place] 2 business days to thoroughly review all your results before contacting the office for clarification. Should we see a critical lab result, you  will be contacted sooner.     If You Need Anything After Your Visit   If you have any questions or concerns for your doctor, please call our main line at [phone]. If no one answers, please leave a voicemail as directed and we will return your call as soon as possible. Messages left after 4 pm will be answered the following business day.    You may also send [place] a message via MyChart. We typically respond to MyChart messages within 1-2 business days.  For prescription refills, please ask your pharmacy to contact our office. Our fax number is [phone].  If you have an urgent issue when the clinic is closed that cannot wait until the next business day, you can page your doctor at the number below.     Please note that while we do our best to be available for urgent issues outside of office hours, we are not available 24/7.    If you have an urgent issue and are unable to reach [place], you may choose to seek medical care at your doctor's office, retail clinic, urgent care center, or emergency room.   If you have a medical emergency, please immediately call 911 or go to the emergency department. In the event of inclement weather, please call our main line at [phone] for an update on the status of any delays or closures.  Dermatology Medication Tips: Please keep the boxes that topical medications come in in order to help keep track of the instructions about where and how to use these. Pharmacies typically print the medication instructions  only on the boxes and not directly on the medication tubes.   If your medication is too expensive, please contact our office at [phone] or send [place] a message through MyChart.    We are unable to tell what your co-pay for medications will be in advance as this is different depending on your insurance coverage. However, we may be able to find a substitute medication at lower cost or fill out paperwork to get insurance to cover a needed medication.     If a prior authorization is required to get your medication covered by your insurance company, please allow [place] 1-2 business days to complete this process.   Drug prices often vary depending on where the prescription is filled and some pharmacies may offer cheaper prices.   The website www.goodrx.com contains coupons for medications through different pharmacies. The prices here do not account for what the cost may be with help from insurance (it may be cheaper with your insurance), but the website can give you the price if you did not use any insurance.  - You can print the associated coupon and take it with your prescription to the pharmacy.  - You may also stop by our office during regular business hours and pick up a GoodRx coupon card.  - If you need your prescription sent electronically to a different pharmacy, notify our office through [facility] or by phone at [phone]

## 2023-09-17 NOTE — Progress Notes (Signed)
Results discussed with pt at her f/u appointment. She's aware of results and had been scheduled with Dr. Caralyn Guile for Mohs surgery.

## 2023-09-17 NOTE — Progress Notes (Signed)
   Follow-Up Visit   Subjective  Jennifer Huang is a 72 y.o. female who presents for the following: Skin Cancer Screening and Full Body Skin Exam  The patient presents for Total-Body Skin Exam (TBSE) for skin cancer screening and mole check. The patient has spots, moles and lesions to be evaluated, some may be new or changing and the patient may have concern these could be cancer. Pt has hx of SCC right postauricular 07/17/23. No family hx of skin cancer   The following portions of the chart were reviewed this encounter and updated as appropriate: medications, allergies, medical history  Review of Systems:  No other skin or systemic complaints except as noted in HPI or Assessment and Plan.  Objective  Well appearing patient in no apparent distress; mood and affect are within normal limits.  A full examination was performed including scalp, head, eyes, ears, nose, lips, neck, chest, axillae, abdomen, back, buttocks, bilateral upper extremities, bilateral lower extremities, hands, feet, fingers, toes, fingernails, and toenails. All findings within normal limits unless otherwise noted below.   Relevant physical exam findings are noted in the Assessment and Plan.    Assessment & Plan   SKIN CANCER SCREENING PERFORMED TODAY.  1. Biopsy Proven SCC (right posterior auricular) - Assessment: Confirmed skin cancer lesion behind the right ear, noted to have appeared after the pandemic started.  The area is healing, but cancer remains. - Plan: Schedule for surgical excision with Dr. Caralyn Guile using the Mohs surgery technique to ensure complete cancer removal.   ACTINIC DAMAGE - Chronic condition, secondary to cumulative UV/sun exposure - diffuse scaly erythematous macules with underlying dyspigmentation - Recommend daily broad spectrum sunscreen SPF 30+ to sun-exposed areas, reapply every 2 hours as needed.  - Staying in the shade or wearing long sleeves, sun glasses (UVA+UVB protection) and wide  brim hats (4-inch brim around the entire circumference of the hat) are also recommended for sun protection.  - Call for new or changing lesions.  MELANOCYTIC NEVI - Tan-brown and/or pink-flesh-colored symmetric macules and papules - Benign appearing on exam today - Observation - Call clinic for new or changing moles - Recommend daily use of broad spectrum spf 30+ sunscreen to sun-exposed areas.   SEBORRHEIC KERATOSIS - Stuck-on, waxy, tan-brown papules and/or plaques  - Benign-appearing - Discussed benign etiology and prognosis. - Observe - Call for any changes  LENTIGINES Exam: scattered tan macules Due to sun exposure Treatment Plan: Benign-appearing, observe. Recommend daily broad spectrum sunscreen SPF 30+ to sun-exposed areas, reapply every 2 hours as needed.  Call for any changes    HEMANGIOMA Exam: red papule(s) Discussed benign nature. Recommend observation. Call for changes.   DERMATOFIBROMA beside right knee Exam: Firm pink/brown papulenodule with dimple sign. Treatment Plan: A dermatofibroma is a benign growth possibly related to trauma, such as an insect bite, cut from shaving, or inflamed acne-type bump.  Treatment options to remove include shave or excision with resulting scar and risk of recurrence.  Since benign-appearing and not bothersome, will observe for now.    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  Photo documentation taken today will monitor for changes  No follow-ups on file.  Owens Shark, CMA, am acting as scribe for [employer], DO.   Documentation: I have reviewed the above documentation for accuracy and completeness, and I agree with the above.  Langston Reusing, DO

## 2023-09-28 ENCOUNTER — Telehealth: Payer: Self-pay | Admitting: Family Medicine

## 2023-09-28 NOTE — Telephone Encounter (Signed)
Patient called stating that she was wanting to have another knee injection soon but she is having a procedure to remove some cancerous areas behind her ear. She wanted to know how much time, if any, that she should leave in between the two?

## 2023-10-01 ENCOUNTER — Encounter: Payer: Self-pay | Admitting: Dermatology

## 2023-10-01 NOTE — Telephone Encounter (Signed)
Message has been forwarded to Dr. Denyse Amass.

## 2023-10-02 ENCOUNTER — Encounter: Payer: Self-pay | Admitting: Dermatology

## 2023-10-02 ENCOUNTER — Ambulatory Visit: Payer: Medicare Other | Admitting: Dermatology

## 2023-10-02 VITALS — BP 121/64 | HR 67 | Temp 98.4°F

## 2023-10-02 DIAGNOSIS — L579 Skin changes due to chronic exposure to nonionizing radiation, unspecified: Secondary | ICD-10-CM | POA: Diagnosis not present

## 2023-10-02 DIAGNOSIS — L814 Other melanin hyperpigmentation: Secondary | ICD-10-CM

## 2023-10-02 DIAGNOSIS — C4442 Squamous cell carcinoma of skin of scalp and neck: Secondary | ICD-10-CM

## 2023-10-02 DIAGNOSIS — C4492 Squamous cell carcinoma of skin, unspecified: Secondary | ICD-10-CM

## 2023-10-02 MED ORDER — TRAMADOL HCL 50 MG PO TABS
50.0000 mg | ORAL_TABLET | Freq: Four times a day (QID) | ORAL | 0 refills | Status: DC | PRN
Start: 2023-10-02 — End: 2024-08-06

## 2023-10-02 NOTE — Patient Instructions (Addendum)
Wound Care Instructions for After Surgery  On the day following your surgery, you should begin doing daily dressing changes until your sutures are removed: Remove the bandage. Cleanse the wound gently with soap and water.  Make sure you then dry the skin surrounding the wound completely or the tape will not stick to the skin. Do not use cotton balls on the wound. After the wound is clean and dry, apply the ointment (either prescription antibiotic prescribed by your doctor or plain Vaseline if nothing was prescribed) gently with a Q-tip. If you are using a bandaid to cover: Apply a bandaid large enough to cover the entire wound. If you do not have a bandaid large enough to cover the wound OR if you are sensitive to bandaid adhesive: Cut a non-stick pad (such as Telfa) to fit the size of the wound.  Cover the wound with the non-stick pad. If the wound is draining, you may want to add a small amount of gauze on top of the non-stick pad for a little added compression to the area. Use tape to seal the area completely.  For the next 1-2 weeks: Be sure to keep the wound moist with ointment 24/7 to ensure best healing. If you are unable to cover the wound with a bandage to hold the ointment in place, you may need to reapply the ointment several times a day. Do not bend over or lift heavy items to reduce the chance of elevated blood pressure to the wound. Do not participate in particularly strenuous activities.  Below is a list of dressing supplies you might need.  Cotton-tipped applicators - Q-tips Gauze pads (2x2 and/or 4x4) - All-Purpose Sponges New and clean tube of petroleum jelly (Vaseline) OR prescription antibiotic ointment if prescribed Either a bandaid large enough to cover the entire wound OR non-stick dressing material (Telfa) and Tape (Paper or Hypafix)  FOR ADULT SURGERY PATIENTS: If you need something for pain relief, you may take 1 extra strength Tylenol (acetaminophen) and 2  ibuprofen (200 mg) together every 4 hours as needed. (Do not take these medications if you are allergic to them or if you know you cannot take them for any other reason). Typically you may only need pain medication for 1-3 days.   Comments on the Post-Operative Period Slight swelling and redness often appear around the wound. This is normal and will disappear within several days following the surgery. The healing wound will drain a brownish-red-yellow discharge during healing. This is a normal phase of wound healing. As the wound begins to heal, the drainage may increase in amount. Again, this drainage is normal. Notify us if the drainage becomes persistently bloody, excessively swollen, or intensely painful or develops a foul odor or red streaks.  The healing wound will also typically be itchy. This is normal. If you have severe or persistent pain, Notify us if the discomfort is severe or persistent. Avoid alcoholic beverages when taking pain medicine.  In Case of Wound Hemorrhage A wound hemorrhage is when the bandage suddenly becomes soaked with bright red blood and flows profusely. If this happens, sit down or lie down with your head elevated. If the wound has a dressing on it, do not remove the dressing. Apply pressure to the existing gauze. If the wound is not covered, use a gauze pad to apply pressure and continue applying the pressure for 20 minutes without peeking. DO NOT COVER THE WOUND WITH A LARGE TOWEL OR WASH CLOTH. Release your hand from the  wound site but do not remove the dressing. If the bleeding has stopped, gently clean around the wound. Leave the dressing in place for 24 hours if possible. This wait time allows the blood vessels to close off so that you do not spark a new round of bleeding by disrupting the newly clotted blood vessels with an immediate dressing change. If the bleeding does not subside, continue to hold pressure for 40 minutes. If bleeding continues, page your  physician, contact an After Hours clinic or go to the Emergency Room.    Important Information  Due to recent changes in healthcare laws, you may see results of your pathology and/or laboratory studies on MyChart before the doctors have had a chance to review them. We understand that in some cases there may be results that are confusing or concerning to you. Please understand that not all results are received at the same time and often the doctors may need to interpret multiple results in order to provide you with the best plan of care or course of treatment. Therefore, we ask that you please give [place] 2 business days to thoroughly review all your results before contacting the office for clarification. Should we see a critical lab result, you will be contacted sooner.   If You Need Anything After Your Visit  If you have any questions or concerns for your doctor, please call our main line at [phone] If no one answers, please leave a voicemail as directed and we will return your call as soon as possible. Messages left after 4 pm will be answered the following business day.   You may also send [place] a message via MyChart. We typically respond to MyChart messages within 1-2 business days.  For prescription refills, please ask your pharmacy to contact our office. Our fax number is [phone].  If you have an urgent issue when the clinic is closed that cannot wait until the next business day, you can page your doctor at the number below.    Please note that while we do our best to be available for urgent issues outside of office hours, we are not available 24/7.   If you have an urgent issue and are unable to reach [place], you may choose to seek medical care at your doctor's office, retail clinic, urgent care center, or emergency room.  If you have a medical emergency, please immediately call 911 or go to the emergency department. In the event of inclement weather, please call our main line at  [phone] for an update on the status of any delays or closures.  Dermatology Medication Tips: Please keep the boxes that topical medications come in in order to help keep track of the instructions about where and how to use these. Pharmacies typically print the medication instructions only on the boxes and not directly on the medication tubes.   If your medication is too expensive, please contact our office at [phone] or send [place] a message through MyChart.   We are unable to tell what your co-pay for medications will be in advance as this is different depending on your insurance coverage. However, we may be able to find a substitute medication at lower cost or fill out paperwork to get insurance to cover a needed medication.   If a prior authorization is required to get your medication covered by your insurance company, please allow [place] 1-2 business days to complete this process.  Drug prices often vary depending on where the prescription is filled and  some pharmacies may offer cheaper prices.  The website www.goodrx.com contains coupons for medications through different pharmacies. The prices here do not account for what the cost may be with help from insurance (it may be cheaper with your insurance), but the website can give you the price if you did not use any insurance.  - You can print the associated coupon and take it with your prescription to the pharmacy.  - You may also stop by our office during regular business hours and pick up a GoodRx coupon card.  - If you need your prescription sent electronically to a different pharmacy, notify our office through [facility] or by phone at [phone]    HISTORY OF SQUAMOUS CELL CARCINOMA OF THE SKIN - No evidence of recurrence today - No lymphadenopathy - Recommend regular full body skin exams - Recommend daily broad spectrum sunscreen SPF 30+ to sun-exposed areas, reapply every 2 hours as needed.  - Call if any new or  changing lesions are noted between office visits

## 2023-10-02 NOTE — Telephone Encounter (Signed)
Called pt at [phone], unable to leave VM.

## 2023-10-02 NOTE — Progress Notes (Addendum)
Follow-Up Visit   Subjective  Jennifer Huang is a 72 y.o. 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 Katrinka Blazing.  The following portions of the chart were reviewed this encounter and updated as appropriate: medications, allergies, medical history  Review of Systems:  No other skin or systemic complaints except as noted in HPI or Assessment and Plan.  Objective  Well appearing patient in no apparent distress; mood and affect are within normal limits.  A focused examination was performed of the following areas:  Right postauricular  Relevant physical exam findings are noted in the Assessment and Plan.   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 Yes   Was the anticoagulation regimen changed prior to Mohs? No    Procedure Details: Timeout: pre-procedure verification complete Procedure Prep: patient was prepped and draped in usual sterile fashion Prep type: chlorhexidine Biopsy accession number: [id number] Biopsy lab: [facility] Date of biopsy: 07/17/2023 Frozen section biopsy performed: No   Specimen debulked: Yes   Pre-Op diagnosis: squamous cell carcinoma SCC subtype: well differentiated MohsAIQ Surgical site (if tumor spans multiple areas, please select predominant area): scalp Surgery side: right Surgical site (from skin exam): Right Postauricular Area Pre-operative length (cm): 3.3 Pre-operative width (cm): 2.7 Indications for Mohs surgery: anatomic location where tissue conservation is critical Previously treated? No    Micrographic Surgery Details: Post-operative length (cm): 3.8 Post-operative width  (cm): 4 Number of Mohs stages: 3 Is this a complex case (associate members only): Yes    Stage 1    Tumor features identified on Mohs section: squamous cell carcinoma    Depth of defect after stage: dermis    Perineural invasion: no perineural invasion  Stage 2    Tumor features identified on Mohs section: squamous cell carcinoma    Depth of defect after stage: dermis    Perineural invasion: no perineural invasion  Stage 3    Tumor features identified on Mohs section: no tumor identified    Depth of defect after stage: subcutaneous fat    Perineural invasion: no perineural invasion  Patient tolerance of procedure: tolerated well, no immediate complications  Reconstruction: Was the defect reconstructed? Yes   Was reconstruction performed by the same Mohs surgeon? Yes   Setting of reconstruction: outpatient office When was reconstruction performed? same day Type of reconstruction: linear Linear reconstruction: intermediate Length of linear repair (cm): 9  Opioids: Did the patient receive a prescription for opioid/narcotic related to Mohs surgery? Yes   Indications for opioid/narcotics: patient required additional pain relief despite trial of non-opioid analgesia  Antibiotics: Does patient meet AHA guidelines for endocarditis?: No   Does patient meet AHA guidelines for orthopedic prophylaxis?: No   Were antibiotics given on the day of surgery?: No   Did surgery breach mucosa, expose cartilage/bone, involve an area of lymphedema/inflamed/infected tissue? No    Skin repair Complexity:  Complex Final length (cm):  9.2 Informed consent: discussed and consent obtained   Timeout: patient name, date of birth, surgical site, and procedure verified   Procedure prep:  Patient was prepped and draped in usual sterile fashion Prep type:  Chlorhexidine Anesthesia: the lesion was anesthetized in a standard fashion   Anesthetic:  1% lidocaine w/ epinephrine 1-100,000 buffered w/ 8.4%  NaHCO3 Reason for type of repair: reduce tension to allow closure, allow closure of the large defect and preserve normal anatomy   Undermining: area extensively undermined   Subcutaneous layers (deep stitches):  Suture size:  3-0 Suture type: Vicryl (polyglactin 910) and PDS (polydioxanone)   Stitches:  Buried vertical mattress Fine/surface layer approximation (top stitches):  Suture size:  5-0 Suture type: Prolene (polypropylene)   Stitches: simple running   Suture removal (days):  7 Hemostasis achieved with: suture, pressure and electrodesiccation Outcome: patient tolerated procedure well with no complications   Post-procedure details: sterile dressing applied and wound care instructions given   Dressing type: petrolatum and pressure dressing   Specimen 1 - Surgical pathology  Check Margins: No Related Medications traMADol (ULTRAM) 50 MG tablet Take 1 tablet (50 mg total) by mouth every 6 (six) hours as needed for up to 10 doses.   Return in about 1 week (around 10/09/2023) for suture removal.  I, Bernette Redbird, Surg Tech III, am acting as scribe for Gwenith Daily, MD.    10/18/2023  HISTORY OF PRESENT ILLNESS  Jennifer Huang is seen in consultation at the request of Dr. 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 They note that the area has been present for about 3 years increasing in size with time and itching.  There is no history of previous treatment.  Reports no other new or changing lesions and has no other complaints today.  Medications and allergies: see patient chart.  Review of systems: Reviewed 8 systems and notable for the above skin cancer.  All other systems reviewed are unremarkable/negative, unless noted in the HPI. Past medical history, surgical history, family history, social history were also reviewed and are noted in the chart/questionnaire.    PHYSICAL EXAMINATION  General:  Well-appearing, in no acute distress, alert and oriented x 4. Vitals reviewed in chart (if available).   Skin: Exam reveals a 3.3 x 2.7 cm erythematous papule and biopsy scar on the right postauricular sulcus/scalp. There are rhytids, telangiectasias, and lentigines, consistent with photodamage.  Biopsy report(s) reviewed, confirming the diagnosis.   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 Due to location, size, histology, or recurrence and the likelihood of subclinical extension as well as the need to conserve normal surrounding tissue, the patient was deemed acceptable for Mohs micrographic surgery (MMS).  The nature and purpose of the procedure, associated benefits and risks including recurrence and scarring, possible complications such as pain, infection, and bleeding, and alternative methods of treatment if appropriate were discussed with the patient during consent. The lesion location was verified by the patient, by reviewing previous notes, pathology reports, and by photographs as well as angulation measurements if available.  Informed consent was reviewed and signed by the patient, and timeout was performed at 10:00 AM. See op note below.  2. For the photodamage and solar lentigines, sun protection discussed/information given on OTC sunscreens, and we recommend continued regular follow-up with primary dermatologist every 6 months or sooner for any growing, bleeding, or changing lesions. 3. Prognosis and future surveillance discussed. 4. Letter with treatment outcome sent to referring provider. 5. Pain acetaminophen/ibuprofen/tramadol 50 mg  MOHS MICROGRAPHIC SURGERY AND RECONSTRUCTION  Initial size:   3.3 x 2.7 cm Surgical defect/wound size: 3.8  x 4.0 cm Anesthesia:    0.33%  lidocaine with 1:200,000 epinephrine EBL:    <5 mL Complications:  None Repair type:   Complex SQ  suture:   3-0 Vicryl and 3-0 PDS Cutaneous suture:  5-0 prolene Final size of the repair: 9.2 cm  Stages: 3  STAGE I: Anesthesia achieved with 0.5% lidocaine with 1:200,000 epinephrine. ChloraPrep applied. 2 section(s) excised using Mohs technique (this includes total peripheral and deep tissue margin excision and evaluation with frozen sections, excised and interpreted by the same physician). The tumor was first debulked and then excised with an approx. 2 mm margin.  Hemostasis was achieved with electrocautery as needed.  The specimen was then oriented, subdivided/relaxed, inked, and processed using Mohs technique.    Frozen section analysis revealed a positive margin for atypical epithelial cells with squamous differentiation in the dermis in the peripheral margin.    STAGE II: An additional 2 mm margin was excised.  Hemostasis was achieved with electrocautery as needed.  The specimen was then oriented, subdivided/relaxed, inked, and processed using Mohs technique.   Frozen section analysis revealed a positive margin for atypical epithelial cells with squamous differentiation in the dermis in the peripheral margin.  STAGE III: An additional 2 mm margin was excised.  Hemostasis was achieved with electrocautery as needed.  The specimen was then oriented, subdivided/relaxed, inked, and processed using Mohs technique. Evaluation of slides by the Mohs surgeon revealed clear tumor margins.  Reconstruction  The surgical wound was then cleaned, prepped, and re-anesthetized as above. Wound edges were undermined extensively along at least one entire edge and at a distance equal to or greater than the width of the defect (see wound defect size above) in order to achieve closure and decrease wound tension and anatomic distortion. Redundant tissue repair including standing cone removal was performed. Hemostasis was achieved with electrocautery. Subcutaneous and epidermal tissues were approximated with the  above sutures. The surgical site was then lightly scrubbed with sterile, saline-soaked gauze. The area was then bandaged using Vaseline ointment, non-adherent gauze, gauze pads, and tape to provide an adequate pressure dressing. The patient tolerated the procedure well, was given detailed written and verbal wound care instructions, and was discharged in good condition.   A small area in the center of the wound was left to heal by secondary intention due to mid wound tension.  The patient will follow-up: 1 week.   Documentation: I have reviewed the above documentation for accuracy and completeness, and I agree with the above.  Gwenith Daily, MD

## 2023-10-02 NOTE — Telephone Encounter (Signed)
We cannot do another Zilretta injection until mid January.  Will need to authorize it again in the new year.  Please send me a message on or after January 1 asking for Zilretta.  I do not think the knee injections can be that big of a deal with your upcoming Mohs surgery.  You probably do not want me doing an injection right around the time of surgery.  I would give it a week on either side.

## 2023-10-03 ENCOUNTER — Encounter: Payer: Self-pay | Admitting: Dermatology

## 2023-10-08 NOTE — Telephone Encounter (Signed)
Called pt and advised per Dr. Corey. Pt verbalized understanding.  

## 2023-10-09 ENCOUNTER — Ambulatory Visit: Payer: Medicare Other | Admitting: Family Medicine

## 2023-10-09 ENCOUNTER — Encounter: Payer: Self-pay | Admitting: Dermatology

## 2023-10-09 ENCOUNTER — Ambulatory Visit (INDEPENDENT_AMBULATORY_CARE_PROVIDER_SITE_OTHER): Payer: Medicare Other | Admitting: Dermatology

## 2023-10-09 DIAGNOSIS — C4442 Squamous cell carcinoma of skin of scalp and neck: Secondary | ICD-10-CM

## 2023-10-09 DIAGNOSIS — L905 Scar conditions and fibrosis of skin: Secondary | ICD-10-CM

## 2023-10-09 DIAGNOSIS — C4492 Squamous cell carcinoma of skin, unspecified: Secondary | ICD-10-CM

## 2023-10-09 DIAGNOSIS — T1490XD Injury, unspecified, subsequent encounter: Secondary | ICD-10-CM

## 2023-10-09 NOTE — Patient Instructions (Signed)
 Wound Care Instructions for After Surgery  On the day following your surgery, you should begin doing daily dressing changes until your sutures are removed: Remove the bandage. Cleanse the wound gently with soap and water.  Make sure you then dry the skin surrounding the wound completely or the tape will not stick to the skin. Do not use cotton balls on the wound. After the wound is clean and dry, apply the ointment (either prescription antibiotic prescribed by your doctor or plain Vaseline if nothing was prescribed) gently with a Q-tip. If you are using a bandaid to cover: Apply a bandaid large enough to cover the entire wound. If you do not have a bandaid large enough to cover the wound OR if you are sensitive to bandaid adhesive: Cut a non-stick pad (such as Telfa) to fit the size of the wound.  Cover the wound with the non-stick pad. If the wound is draining, you may want to add a small amount of gauze on top of the non-stick pad for a little added compression to the area. Use tape to seal the area completely.  For the next 1-2 weeks: Be sure to keep the wound moist with ointment 24/7 to ensure best healing. If you are unable to cover the wound with a bandage to hold the ointment in place, you may need to reapply the ointment several times a day. Do not bend over or lift heavy items to reduce the chance of elevated blood pressure to the wound. Do not participate in particularly strenuous activities.  Below is a list of dressing supplies you might need.  Cotton-tipped applicators - Q-tips Gauze pads (2x2 and/or 4x4) - All-Purpose Sponges New and clean tube of petroleum jelly (Vaseline) OR prescription antibiotic ointment if prescribed Either a bandaid large enough to cover the entire wound OR non-stick dressing material (Telfa) and Tape (Paper or Hypafix)  FOR ADULT SURGERY PATIENTS: If you need something for pain relief, you may take 1 extra strength Tylenol (acetaminophen) and 2  ibuprofen (200 mg) together every 4 hours as needed. (Do not take these medications if you are allergic to them or if you know you cannot take them for any other reason). Typically you may only need pain medication for 1-3 days.   Comments on the Post-Operative Period Slight swelling and redness often appear around the wound. This is normal and will disappear within several days following the surgery. The healing wound will drain a brownish-red-yellow discharge during healing. This is a normal phase of wound healing. As the wound begins to heal, the drainage may increase in amount. Again, this drainage is normal. Notify us if the drainage becomes persistently bloody, excessively swollen, or intensely painful or develops a foul odor or red streaks.  The healing wound will also typically be itchy. This is normal. If you have severe or persistent pain, Notify us if the discomfort is severe or persistent. Avoid alcoholic beverages when taking pain medicine.  In Case of Wound Hemorrhage A wound hemorrhage is when the bandage suddenly becomes soaked with bright red blood and flows profusely. If this happens, sit down or lie down with your head elevated. If the wound has a dressing on it, do not remove the dressing. Apply pressure to the existing gauze. If the wound is not covered, use a gauze pad to apply pressure and continue applying the pressure for 20 minutes without peeking. DO NOT COVER THE WOUND WITH A LARGE TOWEL OR WASH CLOTH. Release your hand from the  wound site but do not remove the dressing. If the bleeding has stopped, gently clean around the wound. Leave the dressing in place for 24 hours if possible. This wait time allows the blood vessels to close off so that you do not spark a new round of bleeding by disrupting the newly clotted blood vessels with an immediate dressing change. If the bleeding does not subside, continue to hold pressure for 40 minutes. If bleeding continues, page your  physician, contact an After Hours clinic or go to the Emergency Room.    Important Information  Due to recent changes in healthcare laws, you may see results of your pathology and/or laboratory studies on MyChart before the doctors have had a chance to review them. We understand that in some cases there may be results that are confusing or concerning to you. Please understand that not all results are received at the same time and often the doctors may need to interpret multiple results in order to provide you with the best plan of care or course of treatment. Therefore, we ask that you please give [place] 2 business days to thoroughly review all your results before contacting the office for clarification. Should we see a critical lab result, you will be contacted sooner.   If You Need Anything After Your Visit  If you have any questions or concerns for your doctor, please call our main line at [phone] If no one answers, please leave a voicemail as directed and we will return your call as soon as possible. Messages left after 4 pm will be answered the following business day.   You may also send [place] a message via MyChart. We typically respond to MyChart messages within 1-2 business days.  For prescription refills, please ask your pharmacy to contact our office. Our fax number is [phone].  If you have an urgent issue when the clinic is closed that cannot wait until the next business day, you can page your doctor at the number below.    Please note that while we do our best to be available for urgent issues outside of office hours, we are not available 24/7.   If you have an urgent issue and are unable to reach [place], you may choose to seek medical care at your doctor's office, retail clinic, urgent care center, or emergency room.  If you have a medical emergency, please immediately call 911 or go to the emergency department. In the event of inclement weather, please call our main line at  [phone] for an update on the status of any delays or closures.  Dermatology Medication Tips: Please keep the boxes that topical medications come in in order to help keep track of the instructions about where and how to use these. Pharmacies typically print the medication instructions only on the boxes and not directly on the medication tubes.   If your medication is too expensive, please contact our office at [phone] or send [place] a message through MyChart.   We are unable to tell what your co-pay for medications will be in advance as this is different depending on your insurance coverage. However, we may be able to find a substitute medication at lower cost or fill out paperwork to get insurance to cover a needed medication.   If a prior authorization is required to get your medication covered by your insurance company, please allow [place] 1-2 business days to complete this process.  Drug prices often vary depending on where the prescription is filled and  some pharmacies may offer cheaper prices.  The website www.goodrx.com contains coupons for medications through different pharmacies. The prices here do not account for what the cost may be with help from insurance (it may be cheaper with your insurance), but the website can give you the price if you did not use any insurance.  - You can print the associated coupon and take it with your prescription to the pharmacy.  - You may also stop by our office during regular business hours and pick up a GoodRx coupon card.  - If you need your prescription sent electronically to a different pharmacy, notify our office through [facility] or by phone at [phone]

## 2023-10-09 NOTE — Progress Notes (Signed)
   New Patient Visit   Subjective  Jennifer Huang is a 72 y.o. female who presents for the following: follow up from Mohs surgery   The patient presents for follow up from Mohs surgery for a SCC on the right postauricular area, treated on 10/02/23, repaired with linear closure. The patient has been bandaging the wound as directed. The endorse the following concerns: numbness and soreness.   The following portions of the chart were reviewed this encounter and updated as appropriate: medications, allergies, medical history  Review of Systems:  No other skin or systemic complaints except as noted in HPI or Assessment and Plan.  Objective  Well appearing patient in no apparent distress; mood and affect are within normal limits.  A full examination was performed including scalp, head, and face. All findings within normal limits unless otherwise noted below.  Healing wound with mild erythema  Relevant physical exam findings are noted in the Assessment and Plan.    Assessment & Plan    Healing s/p Mohs for SCC, treated on right postauricular area, repaired with linear - Reassured that wound is healing well - No evidence of infection - Recommend ibuprofen for cartilage inflammation - No swelling, induration, purulence, dehiscence, or tenderness out of proportion to the clinical exam, see photo above - Discussed that scars take up to 12 months to mature from the date of surgery - Recommend SPF 30+ to scar daily to prevent purple color from UV exposure during scar maturation process - Discussed that erythema and raised appearance of scar will fade over the next 4-6 months - OK to start scar massage at 4-6 weeks post-op - Can consider silicone based products for scar healing starting at 6 weeks post-op - Ok to continue ointment daily to wound under a bandage for another 1-2 wks     Return in about 1 month (around 11/09/2023) for Mohs f/u.  Dominga Ferry, Surg Tech III, am acting as  scribe for Gwenith Daily, MD.   Documentation: I have reviewed the above documentation for accuracy and completeness, and I agree with the above.  Gwenith Daily, MD

## 2023-10-10 ENCOUNTER — Ambulatory Visit: Payer: Medicare Other | Admitting: Gastroenterology

## 2023-10-10 ENCOUNTER — Encounter: Payer: Self-pay | Admitting: Gastroenterology

## 2023-10-10 VITALS — BP 104/60 | HR 65 | Ht 66.0 in | Wt 143.0 lb

## 2023-10-10 DIAGNOSIS — Z08 Encounter for follow-up examination after completed treatment for malignant neoplasm: Secondary | ICD-10-CM | POA: Diagnosis not present

## 2023-10-10 DIAGNOSIS — Z800 Family history of malignant neoplasm of digestive organs: Secondary | ICD-10-CM

## 2023-10-10 NOTE — Patient Instructions (Signed)
We have updated your colonoscopy recall to 09/22/2025.  We will contact you when it gets closer to that time.  _______________________________________________________  If your blood pressure at your visit was 140/90 or greater, please contact your primary care physician to follow up on this.  _______________________________________________________  If you are age 72 or older, your body mass index should be between 23-30. Your Body mass index is 23.08 kg/m. If this is out of the aforementioned range listed, please consider follow up with your Primary Care Provider.  If you are age 56 or younger, your body mass index should be between 19-25. Your Body mass index is 23.08 kg/m. If this is out of the aformentioned range listed, please consider follow up with your Primary Care Provider.   ________________________________________________________  The [facility] providers would like to encourage you to use [facility] to communicate with providers for non-urgent requests or questions.  Due to long hold times on the telephone, sending your provider a message by [facility] may be a faster and more efficient way to get a response.  Please allow 48 business hours for a response.  Please remember that this is for non-urgent requests.  _______________________________________________________

## 2023-10-10 NOTE — Progress Notes (Signed)
10/10/2023 Jennifer Huang [id number] [birth year]   HISTORY OF PRESENT ILLNESS: This is a 72 year old female who was previously patient Dr. Christella Hartigan.  She is here today to discuss colonoscopy recall timing.  Her last colonoscopy was normal in December 2021.  She was placed in for 10-year colonoscopy recall.  Within the past year her brother who is currently 24 years old was diagnosed with colon cancer.  She has no complaints today.  She feels fine from a GI standpoint.   Past Medical History:  Diagnosis Date   Allergies    Allergy    year around seasonal allergies   Colon polyps    anal polyp   Diabetes mellitus without complication (HCC)    pre DM-dieto controlled at this time;   Gout, unspecified    Hyperlipidemia    on meds   Osteoporosis    per Dr. 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   Thyroid disease 08/2019   RIGHT thyroid removed   Thyroid mass    right   Unspecified essential hypertension    on meds   Unspecified glaucoma(365.9)    Left Eye   Past Surgical History:  Procedure Laterality Date   COLON SURGERY     anal polyp excised gen'l anesthesia   COLONOSCOPY  10-14-2010   jacobs-hx TA 2006   COLONOSCOPY  2016   jacobs-TA x 2 -37yr recall-miralax good   ENUCLEATION     OD 2nd to Glaucoma   OOPHORECTOMY     Had Blockage   POLYPECTOMY  2016   TA x 2   SALPINGECTOMY     Had Blockage   THYROIDECTOMY Right 09/08/2019   Procedure: RIGHT HEMI-THYROIDECTOMY;  Surgeon: Newman Pies, MD;  Location: [facility];  Service: ENT;  Laterality: Right;   THYROIDECTOMY, PARTIAL Right    TOTAL ABDOMINAL HYSTERECTOMY  2002   WISDOM TOOTH EXTRACTION      reports that she has never smoked. She has never used smokeless tobacco. She reports that she does not drink alcohol and does not use drugs. family history includes Arthritis in her  sister; Cancer - Other in her brother; Colon polyps in her brother and brother; Diabetes in her brother, brother, brother, and father; Emphysema in her father; Heart disease in her brother and brother; Hyperlipidemia in her brother, brother, brother, and sister; Hypertension in her brother, brother, brother, father, mother, and sister; Osteoarthritis in her mother. Allergies  Allergen Reactions   Penicillins     REACTION: rash/hives   Prednisone     Other reaction(s): Elevates Eye Pressure   Sulfonamide Derivatives     REACTION: rash/hives      Outpatient Encounter Medications as of 10/10/2023  Medication Sig   alendronate (FOSAMAX) 70 MG tablet Take 1 tablet (70 mg total) by mouth every 7 (seven) days.   AMBULATORY NON FORMULARY MEDICATION Orthotic right shoe to correct leg length difference. Disp 1 or a pair.  Fax to [employer] prosthesis [place] M21.70   AMBULATORY NON FORMULARY MEDICATION Orthotic R shoe to correct leg length discrepancy.  Dispense 1 Dx code: M21.70 Use as needed   amLODipine (NORVASC) 10 MG tablet Take 1 tablet (10 mg total) by mouth daily.   aspirin EC 81 MG tablet Take 81 mg by mouth daily. Swallow whole.    Azelastine HCl 0.15 % SOLN Place 2 sprays into  the nose every 12 (twelve) hours as needed.   brimonidine (ALPHAGAN P) 0.1 % SOLN 3 (three) times daily.   CALCIUM PO Take 600 mg by mouth daily.   cyclobenzaprine (FLEXERIL) 5 MG tablet Take 1 tablet (5 mg total) by mouth 3 (three) times daily as needed for muscle spasms.   dorzolamide-timolol (COSOPT) 22.3-6.8 MG/ML ophthalmic solution 1 drop 2 (two) times daily.   EPINEPHrine 0.3 mg/0.3 mL IJ SOAJ injection Inject 0.3 mg into the muscle as needed for anaphylaxis. GENERIC TWINJECT PREFERRED    fexofenadine (ALLEGRA) 180 MG tablet Take 180 mg by mouth daily.   fluticasone (FLONASE) 50 MCG/ACT nasal spray Place 2 sprays into both nostrils daily.   ibuprofen (ADVIL) 600 MG tablet Take 1 tablet (600 mg total) by  mouth every 8 (eight) hours as needed.   ipratropium (ATROVENT) 0.06 % nasal spray Place 2 sprays into both nostrils 4 (four) times daily.   levothyroxine (SYNTHROID) 50 MCG tablet Take 50 mcg by mouth daily.   losartan (COZAAR) 25 MG tablet Take 25 mg by mouth daily.   Multiple Vitamin (MULTIVITAMIN) tablet Take 1 tablet by mouth daily.   Multiple Vitamins-Minerals (HAIR SKIN AND NAILS FORMULA PO) Take 1 tablet by mouth daily.   Olopatadine HCl 0.2 % SOLN Apply 1 drop to eye daily.   ROCKLATAN 0.02-0.005 % SOLN    TART CHERRY PO Take 1 tablet by mouth daily.    traMADol (ULTRAM) 50 MG tablet Take 1 tablet (50 mg total) by mouth every 8 (eight) hours as needed for severe pain.   traMADol (ULTRAM) 50 MG tablet Take 1 tablet (50 mg total) by mouth every 6 (six) hours as needed for up to 10 doses.   vitamin B-12 (CYANOCOBALAMIN) 1000 MCG tablet Take 100 mcg by mouth daily.   VITAMIN D PO Take 1,000 Units by mouth daily.   Ascorbic Acid (VITAMIN C PO) Take 250 mg by mouth. (Patient not taking: Reported on 10/10/2023)   colchicine 0.6 MG tablet TAKE 1 TABLET (0.6 MG TOTAL) BY MOUTH DAILY AS NEEDED (GOUT PAIN). (Patient not taking: Reported on 10/10/2023)   diclofenac Sodium (VOLTAREN) 1 % GEL Apply 2 g topically 4 (four) times daily as needed. (Patient not taking: Reported on 10/10/2023)   furosemide (LASIX) 20 MG tablet TAKE 1 TABLET BY MOUTH  DAILY IF NEEDED (Patient not taking: Reported on 10/10/2023)   gabapentin (NEURONTIN) 100 MG capsule Take 1-3 capsules (100-300 mg total) by mouth at bedtime as needed. prn (Patient not taking: Reported on 10/10/2023)   lidocaine (LIDODERM) 5 % Place 1 patch onto the skin daily. Remove & Discard patch within 12 hours or as directed by MD (Patient not taking: Reported on 10/10/2023)   No facility-administered encounter medications on file as of 10/10/2023.    REVIEW OF SYSTEMS  : All other systems reviewed and negative except where noted in the History of  Present Illness.   PHYSICAL EXAM: BP 104/60   Pulse 65   Ht 5\' 6"  (1.676 m)   Wt 143 lb (64.9 kg)   BMI 23.08 kg/m  General: Well developed white female in no acute distress  ASSESSMENT AND PLAN: *FH of colon cancer: Found out that her 65 year old brother was just diagnosed with colon cancer within the past year.  She was placed in for a 10-year colonoscopy recall, but will place her in for a 5-year colonoscopy recall instead.  She will be due December 2026.   CC:  Combs, Prince Sol[place], *

## 2023-10-12 DIAGNOSIS — S0571XD Avulsion of right eye, subsequent encounter: Secondary | ICD-10-CM | POA: Diagnosis not present

## 2023-10-15 NOTE — Telephone Encounter (Signed)
VOB initiated for ZILRETTA for RIGHT knee OA  Next Prolia inj DUE: 10/30/23

## 2023-10-23 DIAGNOSIS — Z1231 Encounter for screening mammogram for malignant neoplasm of breast: Secondary | ICD-10-CM | POA: Diagnosis not present

## 2023-10-30 NOTE — Telephone Encounter (Addendum)
[employer] - Prior Authorization NOT required  [employer] - Prior Authorization REQUIRED

## 2023-11-01 NOTE — Telephone Encounter (Signed)
 Prior Authorization initiated for [facility] via [email or web address] KEY: [id number]

## 2023-11-02 DIAGNOSIS — H35372 Puckering of macula, left eye: Secondary | ICD-10-CM | POA: Diagnosis not present

## 2023-11-02 DIAGNOSIS — H43812 Vitreous degeneration, left eye: Secondary | ICD-10-CM | POA: Diagnosis not present

## 2023-11-02 MED ORDER — TRIAMCINOLONE ACETONIDE 32 MG IX SRER
INTRA_ARTICULAR | 0 refills | Status: DC
Start: 2023-11-02 — End: 2023-11-04

## 2023-11-02 NOTE — Addendum Note (Signed)
 Addended by: Dierdre Searles on: 11/02/2023 08:35 AM   Modules accepted: Orders

## 2023-11-02 NOTE — Telephone Encounter (Signed)
 Called pt and advised of approval and that rx will be sent to [facility] pharmacy to ship to the office.   Pt verbalized understanding.   RX sent.

## 2023-11-02 NOTE — Telephone Encounter (Signed)
 Pt called, a bit confused. Said we called her today and informed her we would be using [facility] for future Zilretta's but she received a call today from [facility] on [address] regarding this injection. She was unsure how to proceed.

## 2023-11-02 NOTE — Telephone Encounter (Signed)
 Prior Authorization for [employer] for RIGHT knee OA APPROVED  PA# [id number] Valid: 11/01/23-10/31/24

## 2023-11-04 MED ORDER — TRIAMCINOLONE ACETONIDE 32 MG IX SRER
INTRA_ARTICULAR | 0 refills | Status: AC
Start: 2023-11-04 — End: ?
  Filled 2023-11-04: qty 1, fill #0
  Filled 2023-11-07 – 2023-11-09 (×3): qty 1, 180d supply, fill #0

## 2023-11-04 NOTE — Addendum Note (Signed)
 Addended by: Dierdre Searles on: 11/04/2023 01:34 PM   Modules accepted: Orders

## 2023-11-04 NOTE — Telephone Encounter (Signed)
 Sent to [facility] in error.   RX for [employer] re-sent to [facility]

## 2023-11-05 ENCOUNTER — Other Ambulatory Visit (HOSPITAL_COMMUNITY): Payer: Self-pay

## 2023-11-05 ENCOUNTER — Other Ambulatory Visit (HOSPITAL_COMMUNITY): Payer: Self-pay | Admitting: Pharmacy Technician

## 2023-11-05 ENCOUNTER — Encounter (HOSPITAL_COMMUNITY): Payer: Self-pay

## 2023-11-05 ENCOUNTER — Other Ambulatory Visit: Payer: Self-pay

## 2023-11-06 ENCOUNTER — Other Ambulatory Visit: Payer: Self-pay

## 2023-11-07 ENCOUNTER — Other Ambulatory Visit: Payer: Self-pay

## 2023-11-09 ENCOUNTER — Other Ambulatory Visit: Payer: Self-pay

## 2023-11-09 NOTE — Telephone Encounter (Signed)
Patient called stating that she got a call from the pharmacy and it will be over $400 for her get this. She said that this is too much. Is there another way to do this or any other options?

## 2023-11-12 NOTE — Telephone Encounter (Signed)
We can do buy and bill  ZILRETTA for RIGHT knee OA    Medical Buy and Bill   Primary Insurance: BCBS [place] Managed Medicare Co-pay: [phone] Co-insurance: 20% Deductible: does not apply Prior Auth: NOT required     Knee Injection History 02/02/20 - Kenalog LEFT 06/11/20 - Durolane LEFT 06/15/20 - Kenalog LEFT 08/18/20 - Kenalog LEFT 01/25/21 - Kenalog LEFT 04/08/21 - Synvisc-One LEFT 08/09/21 - Kenalog LEFT 10/12/21 - Synvisc-One LEFT 11/17/21 - Kenalog LEFT 03/14/22 - Kenalog LEFT 07/28/22 - Zilretta LEFT 12/27/22 - Zilretta LEFT 01/23/23 - Kenalog RIGHT 04/25/23 - Zilretta LEFT 08/06/23 - Zilretta RIGHT

## 2023-11-12 NOTE — Telephone Encounter (Signed)
ZILRETTA for RIGHT knee OA   Medical Buy and Bill  Primary Insurance: BCBS [place] Managed Medicare Co-pay: [phone] Co-insurance: 20% Deductible: does not apply Prior Auth: NOT required   Knee Injection History 02/02/20 - Kenalog LEFT 06/11/20 - Durolane LEFT 06/15/20 - Kenalog LEFT 08/18/20 - Kenalog LEFT 01/25/21 - Kenalog LEFT 04/08/21 - Synvisc-One LEFT 08/09/21 - Kenalog LEFT 10/12/21 - Synvisc-One LEFT 11/17/21 - Kenalog LEFT 03/14/22 - Kenalog LEFT 07/28/22 - Zilretta LEFT 12/27/22 - Zilretta LEFT 01/23/23 - Kenalog RIGHT 04/25/23 - Zilretta LEFT 08/06/23 - Zilretta RIGHT

## 2023-11-12 NOTE — Telephone Encounter (Signed)
Morphies, Carson J3 days ago   CM Patient called stating that she got a call from the pharmacy and it will be over $400 for her get this. She said that this is too much. Is there another way to do this or any other options?

## 2023-11-13 ENCOUNTER — Ambulatory Visit: Payer: Medicare Other | Admitting: Dermatology

## 2023-11-13 ENCOUNTER — Other Ambulatory Visit: Payer: Self-pay

## 2023-11-13 ENCOUNTER — Encounter: Payer: Self-pay | Admitting: Dermatology

## 2023-11-13 VITALS — BP 125/62 | HR 70

## 2023-11-13 DIAGNOSIS — Z48817 Encounter for surgical aftercare following surgery on the skin and subcutaneous tissue: Secondary | ICD-10-CM | POA: Diagnosis not present

## 2023-11-13 DIAGNOSIS — C4492 Squamous cell carcinoma of skin, unspecified: Secondary | ICD-10-CM

## 2023-11-13 DIAGNOSIS — L905 Scar conditions and fibrosis of skin: Secondary | ICD-10-CM

## 2023-11-13 DIAGNOSIS — Z85828 Personal history of other malignant neoplasm of skin: Secondary | ICD-10-CM

## 2023-11-13 NOTE — Progress Notes (Signed)
Pharmacy Patient Advocate Encounter  Insurance verification completed.   The patient is insured through [employer] test claim for [employer]. Currently a quantity of 1 is a 56 day supply (patient injecting every 2-3 months) and the co-pay is $492.06 .   Patient does not want to pay high copay. Office is looking into alternative options and will reach out to patient. Dis-enrolling

## 2023-11-13 NOTE — Progress Notes (Signed)
   Follow Up Visit   Subjective  Jennifer Huang is a 73 y.o. female who presents for the following: follow up from Mohs surgery on her right postauricular region.  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 The patient has been bandaging the wound as directed. The endorse the following concerns: pt c/o of mild numbing.  The following portions of the chart were reviewed this encounter and updated as appropriate: medications, allergies, medical history  Review of Systems:  No other skin or systemic complaints except as noted in HPI or Assessment and Plan.  Objective  Well appearing patient in no apparent distress; mood and affect are within normal limits.  A full examination was performed including scalp, head, face and neck All findings within normal limits unless otherwise noted below.  Healing wound with mild erythema  Relevant physical exam findings are noted in the Assessment and Plan.       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, treated on 10/02/23, repaired with linear closure - Reassured that wound is healing well - One spitting suture removed today - No evidence of infection - No swelling, induration, purulence, dehiscence, or tenderness out of proportion to the clinical exam, see photo above - Discussed that scars take up to 12 months to mature from the date of surgery - Recommend SPF 30+ to scar daily to prevent purple color from UV exposure during scar maturation process - Discussed that erythema and raised appearance of scar will fade over the next 4-6 months - OK to start scar massage at 4-6 weeks post-op - Can consider silicone based products for scar healing starting at 6 weeks post-op - Ok to continue ointment daily to wound under a bandage for another few days  Return if symptoms worsen or fail to improve.  I, Tillie Fantasia, CMA, am acting as scribe for Gwenith Daily, MD.   Documentation: I have reviewed the above documentation for accuracy and completeness, and I agree with the above.  Gwenith Daily, MD

## 2023-11-13 NOTE — Telephone Encounter (Signed)
LM for patient to call back. See other phone note.

## 2023-11-13 NOTE — Telephone Encounter (Signed)
Left message for patient to call back to schedule. ** Supply needs to be ordered**

## 2023-11-13 NOTE — Patient Instructions (Addendum)
Post-Operative Scar Care: Education and Recommendations  Following your procedure, it's important to care for your scar to promote optimal healing and minimize its appearance. Proper post-operative care can help ensure that the scar heals well, and with time, it may become less noticeable. Below are key recommendations for scar care, including scar massage and the use of silicone scar gels or sheets.  1. General Scar Care Tips: -  Keep the wound clean and dry: Follow your healthcare provider's instructions for wound care, including cleaning the site and changing dressings as needed. -  Avoid sun exposure: Direct sunlight can darken scars and make them more noticeable. Once your wound has healed, apply sunscreen (SPF 30 or higher) to protect the scar from UV rays.  2. Scar Massage: - Start after healing: Wait until the scar has fully healed, with no scabs or open areas (usually 4-6 weeks after surgery). Your healthcare provider will give you specific guidance on when to begin. - Technique: Gently massage the scar in a circular motion for 5-10 minutes, 2-3 times per day. This helps to soften the tissue, reduce swelling, and improve the overall appearance of the scar. - Pressure: Apply gentle, firm pressure during the massage to break down the dense tissue that may form during healing. This helps to prevent the formation of keloids or hypertrophic scars. - Use lotion or ointment: Consider using a mild, fragrance-free lotion or vitamin E ointment to help lubricate the area during massage.  3. Silicone Scar Gels or Sheets: - When to start: Once your wound has healed completely, typically around 4-6 weeks, you can begin using silicone-based scar gels or sheets. These have been shown to improve scar appearance by hydrating the tissue and reducing inflammation. - How to use silicone gels: Apply a thin layer of the gel to the scar and allow it to dry before covering with clothing. You can use the  gel multiple times a day, depending on your provider's recommendation. - How to use silicone sheets: Cut the sheet to fit the size of your scar, and apply it directly to the healed scar. Wear it for 12-24 hours a day, and replace the sheet every few days as directed. - Benefits: Silicone helps reduce redness, flatten the scar, and improve its texture. Continued use over several months can lead to significant improvement in the appearance of the scar.  4. What to Expect: - Healing process: Scars generally take time to mature. The first few months may show redness or swelling, but this usually improves as healing progresses. - Long-term care: Scarring is a natural part of the healing process. While you cannot completely eliminate a scar, proper care can significantly improve its appearance over time. - Patience: It can take up to a year for a scar to fully mature, so it's important to be consistent with scar care and follow-up appointments with your provider.  5. When to Contact Your Healthcare Provider: - If you notice signs of infection (increased redness, warmth, drainage, or pain). - If your scar becomes unusually raised, itchy, or changes in color significantly. - If you have concerns about the appearance of your scar or experience unusual symptoms. - By following these guidelines, you can support your body's natural healing process and help ensure the best possible outcome for your scar. If you have any questions or concerns, please don't hesitate to contact our office.    Important Information   Due to recent changes in healthcare laws, you may  see results of your pathology and/or laboratory studies on MyChart before the doctors have had a chance to review them. We understand that in some cases there may be results that are confusing or concerning to you. Please understand that not all results are received at the same time and often the doctors may need to interpret multiple results in order  to provide you with the best plan of care or course of treatment. Therefore, we ask that you please give [place] 2 business days to thoroughly review all your results before contacting the office for clarification. Should we see a critical lab result, you will be contacted sooner.     If You Need Anything After Your Visit   If you have any questions or concerns for your doctor, please call our main line at [phone]. If no one answers, please leave a voicemail as directed and we will return your call as soon as possible. Messages left after 4 pm will be answered the following business day.    You may also send [place] a message via MyChart. We typically respond to MyChart messages within 1-2 business days.  For prescription refills, please ask your pharmacy to contact our office. Our fax number is [phone].  If you have an urgent issue when the clinic is closed that cannot wait until the next business day, you can page your doctor at the number below.     Please note that while we do our best to be available for urgent issues outside of office hours, we are not available 24/7.    If you have an urgent issue and are unable to reach [place], you may choose to seek medical care at your doctor's office, retail clinic, urgent care center, or emergency room.   If you have a medical emergency, please immediately call 911 or go to the emergency department. In the event of inclement weather, please call our main line at [phone] for an update on the status of any delays or closures.  Dermatology Medication Tips: Please keep the boxes that topical medications come in in order to help keep track of the instructions about where and how to use these. Pharmacies typically print the medication instructions only on the boxes and not directly on the medication tubes.   If your medication is too expensive, please contact our office at [phone] or send [place] a message through MyChart.    We are unable to tell  what your co-pay for medications will be in advance as this is different depending on your insurance coverage. However, we may be able to find a substitute medication at lower cost or fill out paperwork to get insurance to cover a needed medication.    If a prior authorization is required to get your medication covered by your insurance company, please allow [place] 1-2 business days to complete this process.   Drug prices often vary depending on where the prescription is filled and some pharmacies may offer cheaper prices.   The website www.goodrx.com contains coupons for medications through different pharmacies. The prices here do not account for what the cost may be with help from insurance (it may be cheaper with your insurance), but the website can give you the price if you did not use any insurance.  - You can print the associated coupon and take it with your prescription to the pharmacy.  - You may also stop by our office during regular business hours and pick up a GoodRx coupon card.  - If  you need your prescription sent electronically to a different pharmacy, notify our office through [facility] or by phone at [phone]

## 2023-11-19 ENCOUNTER — Ambulatory Visit: Payer: Medicare Other | Admitting: Family Medicine

## 2023-11-19 ENCOUNTER — Encounter: Payer: Self-pay | Admitting: Family Medicine

## 2023-11-19 ENCOUNTER — Other Ambulatory Visit: Payer: Self-pay

## 2023-11-19 ENCOUNTER — Ambulatory Visit (INDEPENDENT_AMBULATORY_CARE_PROVIDER_SITE_OTHER): Payer: Medicare Other

## 2023-11-19 VITALS — BP 110/72 | HR 65 | Ht 66.0 in | Wt 141.0 lb

## 2023-11-19 DIAGNOSIS — M25562 Pain in left knee: Secondary | ICD-10-CM

## 2023-11-19 DIAGNOSIS — G8929 Other chronic pain: Secondary | ICD-10-CM

## 2023-11-19 DIAGNOSIS — M1712 Unilateral primary osteoarthritis, left knee: Secondary | ICD-10-CM

## 2023-11-19 DIAGNOSIS — M1711 Unilateral primary osteoarthritis, right knee: Secondary | ICD-10-CM

## 2023-11-19 DIAGNOSIS — M7652 Patellar tendinitis, left knee: Secondary | ICD-10-CM | POA: Diagnosis not present

## 2023-11-19 DIAGNOSIS — M25462 Effusion, left knee: Secondary | ICD-10-CM | POA: Diagnosis not present

## 2023-11-19 MED ORDER — TRIAMCINOLONE ACETONIDE 32 MG IX SRER
32.0000 mg | Freq: Once | INTRA_ARTICULAR | Status: AC
Start: 2023-11-19 — End: 2023-11-19
  Administered 2023-11-19: 32 mg via INTRA_ARTICULAR

## 2023-11-19 NOTE — Progress Notes (Signed)
   I, [name], CMA acting as a scribe for [name], MD.  Jennifer Huang is a 73 y.o. female who presents to [facility] today for exacerbation of her R knee pain. Pt was last seen by Dr. Denyse Amass on 08/06/23 and was given a R knee Zilretta injection.  Today, pt reports worsening left knee pain after 2-3 missteps. Locates pain to medial aspect. Swelling present. Ambulating without Cane today. Zilretta injection.   Previous left Zilretta knee injection was July 2024.  Dx testing: 08/06/23 Labs   07/19/23 DEXA scan 11/12/21 L knee MRI             08/09/21 L knee XR  Pertinent review of systems: No fevers or chills  Relevant historical information: Hypertension   Exam:  BP 110/72   Pulse 65   Ht 5\' 6"  (1.676 m)   Wt 141 lb (64 kg)   SpO2 100%   BMI 22.76 kg/m  General: Well Developed, well nourished, and in no acute distress.   MSK: Left knee moderate joint effusion normal motion with crepitation.    Lab and Radiology Results   Zilretta injection left knee Procedure: Real-time Ultrasound Guided Injection of left knee joint superior lateral patellar space Device: Philips Affiniti 50G Images permanently stored and available for review in PACS Verbal informed consent obtained.  Discussed risks and benefits of procedure. Warned about infection, hyperglycemia bleeding, damage to structures among others. Patient expresses understanding and agreement Time-out conducted.   Noted no overlying erythema, induration, or other signs of local infection.   Skin prepped in a sterile fashion.   Local anesthesia: Topical Ethyl chloride.   With sterile technique and under real time ultrasound guidance: Zilretta 32 mg injected into knee joint. Fluid seen entering the joint capsule.   Completed without difficulty   Advised to call if fevers/chills, erythema, induration, drainage, or persistent bleeding.   Images permanently stored and available for  review in the ultrasound unit.  Impression: Technically successful ultrasound guided injection.  Lot number: [id number]   X-ray images left knee obtained today personally and independently interpreted Mild medial and moderate patellofemoral DJD.  No acute fractures are visible. Await formal radiology review   Assessment and Plan: 73 y.o. female with chronic knee pain with acute exacerbation.  Pain due to DJD.  Plan for Zilretta injections starting today.  Previous injection was July 2024.  Recheck back as needed.   PDMP not reviewed this encounter. Orders Placed This Encounter  Procedures   [place] LIMITED JOINT SPACE STRUCTURES LOW RIGHT(NO LINKED CHARGES)    Reason for Exam (SYMPTOM  OR DIAGNOSIS REQUIRED):   right knee pain    Preferred imaging location?:   [facility] Knee AP/LAT W/[place] Left    Standing Status:   Future    Number of Occurrences:   1    Expiration Date:   12/20/2023    Reason for Exam (SYMPTOM  OR DIAGNOSIS REQUIRED):   left knee pain    Preferred imaging location?:   [facility]   Meds ordered this encounter  Medications   Triamcinolone Acetonide (ZILRETTA) intra-articular injection 32 mg     Discussed warning signs or symptoms. Please see discharge instructions. Patient expresses understanding.   The above documentation has been reviewed and is accurate and complete [name], M.D.

## 2023-11-19 NOTE — Patient Instructions (Signed)
Thank you for coming in today.  You received an injection today. Seek immediate medical attention if the joint becomes red, extremely painful, or is oozing fluid.  Please get an Xray today before you leave

## 2023-11-27 NOTE — Progress Notes (Signed)
Left knee x-ray shows mild arthritis.

## 2023-11-28 ENCOUNTER — Ambulatory Visit: Payer: Medicare Other | Admitting: Dermatology

## 2023-11-28 ENCOUNTER — Encounter: Payer: Self-pay | Admitting: Dermatology

## 2023-11-28 VITALS — BP 127/67 | HR 56

## 2023-11-28 DIAGNOSIS — C4492 Squamous cell carcinoma of skin, unspecified: Secondary | ICD-10-CM

## 2023-11-28 DIAGNOSIS — L905 Scar conditions and fibrosis of skin: Secondary | ICD-10-CM | POA: Diagnosis not present

## 2023-11-28 DIAGNOSIS — Z85828 Personal history of other malignant neoplasm of skin: Secondary | ICD-10-CM

## 2023-11-28 DIAGNOSIS — T8131XA Disruption of external operation (surgical) wound, not elsewhere classified, initial encounter: Secondary | ICD-10-CM | POA: Diagnosis not present

## 2023-11-28 NOTE — Progress Notes (Signed)
   Follow-Up Visit   Subjective  Jennifer Huang is a 73 y.o. female who presents for the following: concerned about healing from a excision. She feels like there may be a suture coming out from the top of her suture line.   The following portions of the chart were reviewed this encounter and updated as appropriate: medications, allergies, medical history  Review of Systems:  No other skin or systemic complaints except as noted in HPI or Assessment and Plan.  Objective  Well appearing patient in no apparent distress; mood and affect are within normal limits.  Patient has a suture reaction.  We were able to remove it.  The patient tolerated it well.  A focused examination was performed of the following areas:  Neck   Relevant exam findings are noted in the Assessment and Plan.   Assessment & Plan   Patient is complaining about a sore spot in healing wound.  Upon exam I found a suture reaction from previous excision.  I was able to fully remove the stitch.  The patient tolerated it well.  No further treatment is needed.  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, treated on 10/02/23, repaired with linear closure - Reassured that wound is healing well - One spitting suture removed today - No evidence of infection - No swelling, induration, purulence, dehiscence, or tenderness out of proportion to the clinical exam, see photo above - Discussed that scars take up to 12 months to mature from the date of surgery - Recommend SPF 30+ to scar daily to prevent purple color from UV exposure during scar maturation process - Discussed that erythema and raised appearance of scar will fade over the next 4-6 months - OK to start scar massage at 4-6 weeks post-op - Can consider silicone based products for scar healing starting at 6 weeks post-op  Return if symptoms worsen or fail to improve.  LILLETTE Berwyn Baseman, Surg Tech III, am acting as scribe for RUFUS CHRISTELLA HOLY, MD.    Documentation: I have reviewed the above documentation for accuracy and completeness, and I agree with the above.  RUFUS CHRISTELLA HOLY, MD

## 2023-12-13 NOTE — Telephone Encounter (Signed)
Pt received Zilretta injection for LEFT knee OA 11/19/23.  Can consider repeat injection on or after 02/12/24.

## 2023-12-27 ENCOUNTER — Ambulatory Visit: Payer: Medicare Other | Admitting: Dermatology

## 2023-12-27 ENCOUNTER — Encounter: Payer: Self-pay | Admitting: Dermatology

## 2023-12-27 VITALS — BP 127/65 | HR 76

## 2023-12-27 DIAGNOSIS — L905 Scar conditions and fibrosis of skin: Secondary | ICD-10-CM

## 2023-12-27 DIAGNOSIS — F4321 Adjustment disorder with depressed mood: Secondary | ICD-10-CM

## 2023-12-27 DIAGNOSIS — L539 Erythematous condition, unspecified: Secondary | ICD-10-CM

## 2023-12-27 DIAGNOSIS — Z85828 Personal history of other malignant neoplasm of skin: Secondary | ICD-10-CM | POA: Diagnosis not present

## 2023-12-27 DIAGNOSIS — C4492 Squamous cell carcinoma of skin, unspecified: Secondary | ICD-10-CM

## 2023-12-27 NOTE — Progress Notes (Signed)
   Follow Up Visit   Subjective  Jennifer Huang is a 73 y.o. female who presents for the following: follow up from Mohs surgery   The patient presents for follow up from Mohs surgery for a SCC on the postauricular region, treated on 10/02/23, repaired with linear repair. The patient has been bandaging the wound as directed. The endorse the following concerns: itching and redness across the top part of the suture line.  The following portions of the chart were reviewed this encounter and updated as appropriate: medications, allergies, medical history  Review of Systems:  No other skin or systemic complaints except as noted in HPI or Assessment and Plan.  Objective  Well appearing patient in no apparent distress; mood and affect are within normal limits.  A full examination was performed including scalp, head, face and neck. All findings within normal limits unless otherwise noted below.  Healing wound with mild erythema  Relevant physical exam findings are noted in the Assessment and Plan.    Assessment & Plan   Healing s/p Mohs for SCC, treated on 10/02/23, repaired with linear closure. - Reassured that wound is healing well - No evidence of infection - No swelling, induration, purulence, dehiscence, or tenderness out of proportion to the clinical exam, see photo above - Discussed that scars take up to 12 months to mature from the date of surgery - Recommend SPF 30+ to scar daily to prevent purple color from UV exposure during scar maturation process - Discussed that erythema and raised appearance of scar will fade over the next 4-6 months - OK to start scar massage at 4-6 weeks post-op - Can consider silicone based products for scar healing starting at 6 weeks post-op - Ok to discontinue ointment daily to wound.  HISTORY OF SQUAMOUS CELL CARCINOMA OF THE SKIN - No evidence of recurrence today - No lymphadenopathy - Recommend regular full body skin exams - Recommend daily  broad spectrum sunscreen SPF 30+ to sun-exposed areas, reapply every 2 hours as needed.  - Call if any new or changing lesions are noted between office visits  Grief The patient has recently experienced multiple tragic losses within their family, which has understandably caused significant emotional distress. They report relying heavily on their pastor and church community for support during this difficult time. The patient indicates that their pastor provides spiritual guidance, while the church offers a strong network of emotional support. This support system seems to be an important coping mechanism for the patient as they process their grief. Ongoing assessment of the patient's emotional well-being and referral to mental health services may be beneficial to ensure comprehensive care and support during this challenging period.  Return in about 4 weeks (around 01/24/2024) for f/u for 4 Mohs.  I, Manual Meier, Surg Tech III, am acting as scribe for Gwenith Daily, MD.   Documentation: I have reviewed the above documentation for accuracy and completeness, and I agree with the above.  Gwenith Daily, MD

## 2023-12-27 NOTE — Patient Instructions (Signed)

## 2024-01-31 ENCOUNTER — Ambulatory Visit: Admitting: Dermatology

## 2024-01-31 DIAGNOSIS — M5116 Intervertebral disc disorders with radiculopathy, lumbar region: Secondary | ICD-10-CM | POA: Diagnosis not present

## 2024-02-05 ENCOUNTER — Telehealth: Payer: Self-pay | Admitting: Family Medicine

## 2024-02-05 NOTE — Telephone Encounter (Signed)
 Patient is getting a spine injection and says she did not want to get all the injections around the same time so she has pushed her zilretta appointment back and is trying to get in to get her spine injection. She is going to check with her other doctor to see if her new appointment date for the zilretta is okay to get and is checking to see when she can get in to get that spine injection. She also asked how long the approval for zilretta lasts. I have rescheduled her. She may call back if that new appointment will not work. Will you please verify that her approval for zilretta is approved through this new scheduled date?

## 2024-02-06 DIAGNOSIS — M5116 Intervertebral disc disorders with radiculopathy, lumbar region: Secondary | ICD-10-CM | POA: Diagnosis not present

## 2024-02-12 ENCOUNTER — Ambulatory Visit: Admitting: Dermatology

## 2024-02-12 ENCOUNTER — Ambulatory Visit: Payer: Medicare Other | Admitting: Family Medicine

## 2024-02-12 ENCOUNTER — Encounter: Payer: Self-pay | Admitting: Dermatology

## 2024-02-12 VITALS — BP 105/62 | HR 64

## 2024-02-12 DIAGNOSIS — L821 Other seborrheic keratosis: Secondary | ICD-10-CM

## 2024-02-12 DIAGNOSIS — C4492 Squamous cell carcinoma of skin, unspecified: Secondary | ICD-10-CM

## 2024-02-12 DIAGNOSIS — L905 Scar conditions and fibrosis of skin: Secondary | ICD-10-CM

## 2024-02-12 NOTE — Progress Notes (Signed)
   Follow Up Visit   Subjective  Jennifer Huang is a 73 y.o. female who presents for the following: follow up from Mohs surgery   The patient presents for follow up from Mohs surgery for a SCC on the right postauricular area, treated on 10/02/23, repaired with linear closure. The patient has been bandaging the wound as directed. The endorse the following concerns: still tender with stinging and burning.  The following portions of the chart were reviewed this encounter and updated as appropriate: medications, allergies, medical history  Review of Systems:  No other skin or systemic complaints except as noted in HPI or Assessment and Plan.  Objective  Well appearing patient in no apparent distress; mood and affect are within normal limits.  A full examination was performed including scalp, head, face and right postauricular area. All findings within normal limits unless otherwise noted below.  Healing wound with mild erythema  Relevant physical exam findings are noted in the Assessment and Plan.    Assessment & Plan   Healing s/p Mohs for [facility], treated on 10/02/23, repaired with linear repair - Reassured that wound is healing well - No evidence of infection - No swelling, induration, purulence, dehiscence, or tenderness out of proportion to the clinical exam, see photo above - Discussed that scars take up to 12 months to mature from the date of surgery - Recommend SPF 30+ to scar daily to prevent purple color from UV exposure during scar maturation process - Discussed that erythema and raised appearance of scar will fade over the next 4-6 months - Discussed that tenderness is likely from nerves regenerating, reassured  HISTORY OF SQUAMOUS CELL CARCINOMA OF THE SKIN - No evidence of recurrence today - No lymphadenopathy - Recommend regular full body skin exams - Recommend daily broad spectrum sunscreen SPF 30+ to sun-exposed areas, reapply every 2 hours as needed.  - Call if any  new or changing lesions are noted between office visits  SEBORRHEIC KERATOSIS- central face - Stuck-on, waxy, tan-brown papules and/or plaques  - Benign-appearing - Discussed benign etiology and prognosis. - Observe - Call for any changes  Return if symptoms worsen or fail to improve.  I, Wilson Hasten, CMA, am acting as scribe for Deneise Finlay, MD.   Documentation: I have reviewed the above documentation for accuracy and completeness, and I agree with the above.  Deneise Finlay, MD

## 2024-02-12 NOTE — Patient Instructions (Addendum)

## 2024-02-15 ENCOUNTER — Telehealth: Payer: Self-pay

## 2024-02-15 NOTE — Telephone Encounter (Signed)
Called pt, left VM to call the office.  

## 2024-02-15 NOTE — Telephone Encounter (Signed)
Labeled

## 2024-02-15 NOTE — Telephone Encounter (Signed)
**  Patient is scheduled for 02/22/24**  Zilretta  authorized for left knee Patients responsibility for Zilretta  is 20% The remaining covered at 80% by the payer at the contracted rate Patient has a $20 copay Deductible does not apply Only one copay per DOS  OOP max $3500 met $282.11 Once OOP is met coverage goes to 100% and copay will no longer apply  Reference # [id number] 02/12/24-08/15/24

## 2024-02-18 NOTE — Telephone Encounter (Signed)
 Pt has appt 02/22/24. Will address at upcoming visit if pt does not call back.

## 2024-02-19 NOTE — Telephone Encounter (Signed)
 Dr. Alease Hunter, pt had ESI on 02/06/24. OK to proceed with Zilretta  inj on 02/22/24?

## 2024-02-20 NOTE — Telephone Encounter (Signed)
 Zilretta  authorized for left knee Patients responsibility for Zilretta  is 20% The remaining covered at 80% by the payer at the contracted rate Patient has a $20 copay Deductible does not apply Only one copay per DOS  OOP max $3500 met $282.11 Once OOP is met coverage goes to 100% and copay will no longer apply  Reference # [id number] 02/12/24-08/15/24

## 2024-02-20 NOTE — Telephone Encounter (Signed)
Called pt and advised per Dr. Denyse Amass.

## 2024-02-20 NOTE — Telephone Encounter (Signed)
 Yes okay to schedule.

## 2024-02-21 NOTE — Progress Notes (Signed)
 Joanna Muck, PhD, LAT, ATC acting as a scribe for Jennifer Juniper, MD.  RAHINI STIFTER is a 73 y.o. female who presents to [facility] at [facility] today for exacerbation of her R knee pain. Pt was last seen by Dr. Alease Hunter on 08/06/23 and was given a R knee Zilretta  injection.  Today, pt reports R knee pain returned gradually over the last few months. She is wanting to repeat the Zilretta  injection.   She is wanting to discuss the fosamax . She c/o diffuse bone pain and popping.  Additionally she notes some GI upset on this medication.  Dx testing: 08/06/23 Labs   07/19/23 DEXA scan 11/12/21 L knee MRI             08/09/21 L knee XR   Pertinent review of systems: No fevers or chills  Relevant historical information: Hypertension   Exam:  BP 122/78   Pulse 61   Ht 5\' 6"  (1.676 m)   Wt 143 lb (64.9 kg)   SpO2 98%   BMI 23.08 kg/m  General: Well Developed, well nourished, and in no acute distress.   MSK: Right knee mild effusion.  Normal motion with crepitation.    Lab and Radiology Results   Zilretta  injection right knee Procedure: Real-time Ultrasound Guided Injection of right knee joint superior lateral patellar space Device: Philips Affiniti 50G Images permanently stored and available for review in PACS Verbal informed consent obtained.  Discussed risks and benefits of procedure. Warned about infection, hyperglycemia bleeding, damage to structures among others. Patient expresses understanding and agreement Time-out conducted.   Noted no overlying erythema, induration, or other signs of local infection.   Skin prepped in a sterile fashion.   Local anesthesia: Topical Ethyl chloride.   With sterile technique and under real time ultrasound guidance: Zilretta  32 mg injected into knee joint. Fluid seen entering the joint capsule.   Completed without difficulty   Advised to call if fevers/chills, erythema, induration, drainage, or persistent bleeding.    Images permanently stored and available for review in the ultrasound unit.  Impression: Technically successful ultrasound guided injection.     Assessment and Plan: 73 y.o. 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  Plan for repeat Zilretta  injection today.  So far this is working but I do not expect it to last forever.  Ultimately I think she should have a knee replacement.  She would like to see an orthopedic surgeon at [facility].  I recommend that she go ahead and start planning for that in the near future.  Happy to keep doing Zilretta  but I can see that it is going to stop working at some point.  Osteopenia treated with Fosamax .  She is noticing some popping sensation in bones and some GI upset.  I am not sure the Fosamax  is causing the bone popping but it certainly could be causing the GI upset.  She is going to try stopping this medication for about a month and see how she feels.  If she cannot tolerate it consider Reclast infusion or Prolia.   PDMP not reviewed this encounter. Orders Placed This Encounter  Procedures   US  LIMITED JOINT SPACE STRUCTURES LOW RIGHT(NO LINKED CHARGES)    Reason for Exam (SYMPTOM  OR DIAGNOSIS REQUIRED):   right knee pain    Preferred imaging location?:   [facility] ordered this encounter  Medications   Triamcinolone  Acetonide (ZILRETTA ) intra-articular injection 32 mg  Discussed warning signs or symptoms. Please see discharge instructions. Patient expresses understanding.   The above documentation has been reviewed and is accurate and complete Jennifer Huang, M.D.

## 2024-02-22 ENCOUNTER — Other Ambulatory Visit: Payer: Self-pay

## 2024-02-22 ENCOUNTER — Ambulatory Visit: Admitting: Family Medicine

## 2024-02-22 VITALS — BP 122/78 | HR 61 | Ht 66.0 in | Wt 143.0 lb

## 2024-02-22 DIAGNOSIS — G8929 Other chronic pain: Secondary | ICD-10-CM

## 2024-02-22 DIAGNOSIS — M1711 Unilateral primary osteoarthritis, right knee: Secondary | ICD-10-CM | POA: Diagnosis not present

## 2024-02-22 DIAGNOSIS — M25561 Pain in right knee: Secondary | ICD-10-CM | POA: Diagnosis not present

## 2024-02-22 DIAGNOSIS — M8588 Other specified disorders of bone density and structure, other site: Secondary | ICD-10-CM

## 2024-02-22 MED ORDER — TRIAMCINOLONE ACETONIDE 32 MG IX SRER
32.0000 mg | Freq: Once | INTRA_ARTICULAR | Status: AC
Start: 2024-02-22 — End: 2024-02-22
  Administered 2024-02-22: 32 mg via INTRA_ARTICULAR

## 2024-02-22 NOTE — Patient Instructions (Addendum)
 Thank you for coming in today.   You received an injection today. Seek immediate medical attention if the joint becomes red, extremely painful, or is oozing fluid.   Talk to your orthopedic surgeon about possible knee replacement.   Try stopping the Fosamax  for 1 month and let us  know how you feel off of it

## 2024-03-04 ENCOUNTER — Encounter: Admitting: Family Medicine

## 2024-03-06 DIAGNOSIS — E039 Hypothyroidism, unspecified: Secondary | ICD-10-CM | POA: Diagnosis not present

## 2024-03-06 DIAGNOSIS — M810 Age-related osteoporosis without current pathological fracture: Secondary | ICD-10-CM | POA: Diagnosis not present

## 2024-03-06 DIAGNOSIS — I10 Essential (primary) hypertension: Secondary | ICD-10-CM | POA: Diagnosis not present

## 2024-03-11 DIAGNOSIS — M810 Age-related osteoporosis without current pathological fracture: Secondary | ICD-10-CM | POA: Diagnosis not present

## 2024-03-12 ENCOUNTER — Encounter: Payer: Self-pay | Admitting: Family Medicine

## 2024-03-12 ENCOUNTER — Ambulatory Visit: Admitting: Family Medicine

## 2024-03-12 ENCOUNTER — Ambulatory Visit (INDEPENDENT_AMBULATORY_CARE_PROVIDER_SITE_OTHER)

## 2024-03-12 VITALS — Ht 66.0 in

## 2024-03-12 DIAGNOSIS — M1711 Unilateral primary osteoarthritis, right knee: Secondary | ICD-10-CM

## 2024-03-12 DIAGNOSIS — G8929 Other chronic pain: Secondary | ICD-10-CM

## 2024-03-12 DIAGNOSIS — M25561 Pain in right knee: Secondary | ICD-10-CM

## 2024-03-12 NOTE — Patient Instructions (Addendum)
 Thank you for coming in today.   Please get an Xray today before you leave   Call your orthopedic surgeon to consider knee replacement.  [phone]  Dr Loyd Ruiz  Use a walker. If not better let me know. We may consider a MRI

## 2024-03-12 NOTE — Progress Notes (Signed)
   I, Miquel Amen, CMA acting as a scribe for Garlan Juniper, MD.  Jennifer Huang is a 73 y.o. female who presents to [facility] at [facility] today for cont'd R knee pain. Pt was last seen by Dr. Alease Hunter on 02/22/24 and was given a R knee Zilretta  injection.   Today, pt reports medial knee pain.  She twisted her knee resulting in an exacerbation of knee pain.  She is in the process of considering surgery for her knee for total knee replacement.  She would like to see a specific orthopedic surgeon at [facility] whom she has seen in the past.  This would be Dr. Loyd Ruiz.  Dx testing: 08/06/23 Labs              07/19/23 DEXA scan  01/23/23 R knee XR  Pertinent review of systems: No fevers or chills  Relevant historical information: History of left tibial plateau fracture in the past.   Exam:  Ht 5\' 6"  (1.676 m)   BMI 23.08 kg/m  General: Well Developed, well nourished, and in no acute distress.   MSK: Right knee genu varus appearance tender palpation medial joint line normal motion.    Lab and Radiology Results  X-ray images right knee obtained today personally and independently interpreted. Medial tibial plateau is a bit depressed compared to x-ray from 13 months ago.  There is degenerative changes. Await formal radiology review    Assessment and Plan: 73 y.o. female with chronic right knee pain with recent exacerbation.  It is possible she has a new mild tibial plateau fracture.  Recommend reduce weightbearing using a walker to ambulate.  Await radiology overread consider MRI.  Recommend scheduling with Dr. Loyd Ruiz at [facility] orthopedic surgery to consider total knee replacement.  I provided the phone number.   PDMP not reviewed this encounter. Orders Placed This Encounter  Procedures   DG Knee AP/LAT W/Sunrise Right    Standing Status:   Future    Number of Occurrences:   1    Expiration Date:   04/12/2024    Reason for Exam (SYMPTOM  OR DIAGNOSIS REQUIRED):    right knee pain    Preferred imaging location?:   [facility]   No orders of the defined types were placed in this encounter.    Discussed warning signs or symptoms. Please see discharge instructions. Patient expresses understanding.   The above documentation has been reviewed and is accurate and complete Garlan Juniper, M.D.

## 2024-03-19 ENCOUNTER — Ambulatory Visit: Payer: Self-pay | Admitting: Family Medicine

## 2024-03-19 NOTE — Progress Notes (Signed)
 Right knee x-ray shows mild arthritis.

## 2024-03-20 NOTE — Telephone Encounter (Signed)
Patient called back. Aware of results

## 2024-03-26 DIAGNOSIS — H2513 Age-related nuclear cataract, bilateral: Secondary | ICD-10-CM | POA: Diagnosis not present

## 2024-03-26 DIAGNOSIS — H401122 Primary open-angle glaucoma, left eye, moderate stage: Secondary | ICD-10-CM | POA: Diagnosis not present

## 2024-03-26 DIAGNOSIS — H35372 Puckering of macula, left eye: Secondary | ICD-10-CM | POA: Diagnosis not present

## 2024-04-21 DIAGNOSIS — H401122 Primary open-angle glaucoma, left eye, moderate stage: Secondary | ICD-10-CM | POA: Diagnosis not present

## 2024-04-24 DIAGNOSIS — M810 Age-related osteoporosis without current pathological fracture: Secondary | ICD-10-CM | POA: Diagnosis not present

## 2024-04-24 DIAGNOSIS — E039 Hypothyroidism, unspecified: Secondary | ICD-10-CM | POA: Diagnosis not present

## 2024-04-24 DIAGNOSIS — I10 Essential (primary) hypertension: Secondary | ICD-10-CM | POA: Diagnosis not present

## 2024-04-24 DIAGNOSIS — Z0000 Encounter for general adult medical examination without abnormal findings: Secondary | ICD-10-CM | POA: Diagnosis not present

## 2024-05-01 DIAGNOSIS — H35372 Puckering of macula, left eye: Secondary | ICD-10-CM | POA: Diagnosis not present

## 2024-05-02 DIAGNOSIS — F909 Attention-deficit hyperactivity disorder, unspecified type: Secondary | ICD-10-CM | POA: Diagnosis not present

## 2024-05-02 DIAGNOSIS — E039 Hypothyroidism, unspecified: Secondary | ICD-10-CM | POA: Diagnosis not present

## 2024-05-02 DIAGNOSIS — M810 Age-related osteoporosis without current pathological fracture: Secondary | ICD-10-CM | POA: Diagnosis not present

## 2024-05-02 DIAGNOSIS — Z0000 Encounter for general adult medical examination without abnormal findings: Secondary | ICD-10-CM | POA: Diagnosis not present

## 2024-05-06 DIAGNOSIS — M25572 Pain in left ankle and joints of left foot: Secondary | ICD-10-CM | POA: Diagnosis not present

## 2024-05-06 DIAGNOSIS — G8929 Other chronic pain: Secondary | ICD-10-CM | POA: Diagnosis not present

## 2024-05-06 DIAGNOSIS — M25562 Pain in left knee: Secondary | ICD-10-CM | POA: Diagnosis not present

## 2024-05-06 DIAGNOSIS — M25561 Pain in right knee: Secondary | ICD-10-CM | POA: Diagnosis not present

## 2024-05-06 DIAGNOSIS — M174 Other bilateral secondary osteoarthritis of knee: Secondary | ICD-10-CM | POA: Diagnosis not present

## 2024-05-06 DIAGNOSIS — M170 Bilateral primary osteoarthritis of knee: Secondary | ICD-10-CM | POA: Diagnosis not present

## 2024-05-13 ENCOUNTER — Telehealth: Payer: Self-pay | Admitting: Family Medicine

## 2024-05-13 NOTE — Telephone Encounter (Signed)
 Patient just had an injection in right knee about a month ago and she states that she would like to get an injection in her left knee. She has scheduled for Thursday. 05/15/2024. Verifying whether she needs prior auth before injection

## 2024-05-13 NOTE — Telephone Encounter (Signed)
 Case ID [id number]

## 2024-05-13 NOTE — Telephone Encounter (Signed)
 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

## 2024-05-14 ENCOUNTER — Ambulatory Visit: Admitting: Dermatology

## 2024-05-14 ENCOUNTER — Encounter: Payer: Self-pay | Admitting: Dermatology

## 2024-05-14 VITALS — BP 113/64

## 2024-05-14 VITALS — BP 113/64 | HR 67

## 2024-05-14 DIAGNOSIS — W908XXA Exposure to other nonionizing radiation, initial encounter: Secondary | ICD-10-CM

## 2024-05-14 DIAGNOSIS — L814 Other melanin hyperpigmentation: Secondary | ICD-10-CM

## 2024-05-14 DIAGNOSIS — L578 Other skin changes due to chronic exposure to nonionizing radiation: Secondary | ICD-10-CM

## 2024-05-14 DIAGNOSIS — D1801 Hemangioma of skin and subcutaneous tissue: Secondary | ICD-10-CM | POA: Diagnosis not present

## 2024-05-14 DIAGNOSIS — L905 Scar conditions and fibrosis of skin: Secondary | ICD-10-CM

## 2024-05-14 DIAGNOSIS — D224 Melanocytic nevi of scalp and neck: Secondary | ICD-10-CM | POA: Diagnosis not present

## 2024-05-14 DIAGNOSIS — Z1283 Encounter for screening for malignant neoplasm of skin: Secondary | ICD-10-CM

## 2024-05-14 DIAGNOSIS — D229 Melanocytic nevi, unspecified: Secondary | ICD-10-CM

## 2024-05-14 DIAGNOSIS — D2321 Other benign neoplasm of skin of right ear and external auricular canal: Secondary | ICD-10-CM | POA: Diagnosis not present

## 2024-05-14 DIAGNOSIS — C4492 Squamous cell carcinoma of skin, unspecified: Secondary | ICD-10-CM

## 2024-05-14 DIAGNOSIS — Z85828 Personal history of other malignant neoplasm of skin: Secondary | ICD-10-CM | POA: Diagnosis not present

## 2024-05-14 DIAGNOSIS — D485 Neoplasm of uncertain behavior of skin: Secondary | ICD-10-CM

## 2024-05-14 DIAGNOSIS — L821 Other seborrheic keratosis: Secondary | ICD-10-CM | POA: Diagnosis not present

## 2024-05-14 NOTE — Patient Instructions (Addendum)

## 2024-05-14 NOTE — Progress Notes (Signed)
   Total Body Skin Exam (TBSE) Visit   Subjective  Jennifer Huang is a 73 y.o. female ESTABLISHED PATIENT who presents for the following:  Total Body Skin Exam (TBSE)  Patient was last evaluated for TBSE on 09/15/24 .  Patient does not have spots of concern to be evaluated. She does apply sunscreen and/or wears protective coverings. Reports Hx of Bx proven SCC on the right postauricular, treated on 10/02/23, repaired with linear closure by Dr. Corey. Denied family Hx of skin cancers.   The patient has spots, moles and lesions to be evaluated, some may be new or changing and the patient has concerns that these could be cancer.  The following portions of the chart were reviewed this encounter and updated as appropriate: medications, allergies, medical history  Review of Systems:  No other skin or systemic complaints except as noted in HPI or Assessment and Plan.   Objective  Well appearing patient in no apparent distress; mood and affect are within normal limits.  A full examination was performed including scalp, head, eyes, ears, nose, lips, neck, chest, axillae, abdomen, back, buttocks, bilateral upper extremities, bilateral lower extremities, hands, feet, fingers, toes, fingernails, and toenails. All findings within normal limits unless otherwise noted below.   Relevant physical exam findings are noted in the Assessment and Plan. Right Postauricular Area 6mm dark brown macule    Assessment & Plan   LENTIGINES, SEBORRHEIC KERATOSES, HEMANGIOMAS - Benign normal skin lesions - Benign-appearing - Call for any changes  MELANOCYTIC NEVI - Tan-brown and/or pink-flesh-colored symmetric macules and papules - Benign appearing on exam today - Observation - Call clinic for new or changing moles - Recommend daily use of broad spectrum spf 30+ sunscreen to sun-exposed areas.   ACTINIC DAMAGE - Chronic condition, secondary to cumulative UV/sun exposure - diffuse scaly erythematous  macules with underlying dyspigmentation - Recommend daily broad spectrum sunscreen SPF 30+ to sun-exposed areas, reapply every 2 hours as needed.  - Staying in the shade or wearing long sleeves, sun glasses (UVA+UVB protection) and wide brim hats (4-inch brim around the entire circumference of the hat) are also recommended for sun protection.  - Call for new or changing lesions.   SKIN CANCER SCREENING PERFORMED TODAY.  NEOPLASM OF UNCERTAIN BEHAVIOR OF SKIN Right Postauricular Area Skin / nail biopsy Type of biopsy: tangential   Informed consent: discussed and consent obtained   Timeout: patient name, date of birth, surgical site, and procedure verified   Procedure prep:  Patient was prepped and draped in usual sterile fashion Prep type:  Isopropyl alcohol Anesthesia: the lesion was anesthetized in a standard fashion   Anesthetic:  1% lidocaine  w/ epinephrine  1-100,000 buffered w/ 8.4% NaHCO3 Instrument used: DermaBlade   Hemostasis achieved with: aluminum chloride   Outcome: patient tolerated procedure well   Post-procedure details: sterile dressing applied and wound care instructions given   Dressing type: petrolatum gauze and bandage    No follow-ups on file.   Documentation: I have reviewed the above documentation for accuracy and completeness, and I agree with the above.  I, Aries Townley Maranda, CMA, am acting as scribe for [employer], DO.   Delon Lenis, DO

## 2024-05-14 NOTE — Progress Notes (Unsigned)
   LILLETTE Ileana Collet, PhD, LAT, ATC acting as a scribe for Artist Lloyd, MD.  Jennifer Huang is a 73 y.o. female who presents to [facility] at [facility] today for cont'd L knee pain. Pt was last seen by Dr. Lloyd on 03/12/24 for her R knee and she was advised to try limit weightbearing.   Today, pt reports ***  Dx testing: 08/06/23 Labs              07/19/23 DEXA scan  01/23/23 R knee XR   Pertinent review of systems: ***  Relevant historical information: ***   Exam:  There were no vitals taken for this visit. General: Well Developed, well nourished, and in no acute distress.   MSK: ***    Lab and Radiology Results No results found for this or any previous visit (from the past 72 hours). No results found.     Assessment and Plan: 73 y.o. female with ***   PDMP not reviewed this encounter. No orders of the defined types were placed in this encounter.  No orders of the defined types were placed in this encounter.    Discussed warning signs or symptoms. Please see discharge instructions. Patient expresses understanding.   ***

## 2024-05-14 NOTE — Progress Notes (Signed)
   Follow Up Visit   Subjective  Jennifer Huang is a 73 y.o. female who presents for the following: follow up from Mohs surgery   The patient presents for follow up from Mohs surgery for a SCC on the right postauricular, treated on 10/02/23, repaired with linear closure. The patient has been bandaging the wound as directed. The endorse the following concerns: stinging and burning periodically.  The following portions of the chart were reviewed this encounter and updated as appropriate: medications, allergies, medical history  Review of Systems:  No other skin or systemic complaints except as noted in HPI or Assessment and Plan.  Objective  Well appearing patient in no apparent distress; mood and affect are within normal limits.  A focal examination was performed including scalp, head, face and right postauricular area. All findings within normal limits unless otherwise noted below.  Healing wound with mild erythema  Relevant physical exam findings are noted in the Assessment and Plan.    Assessment & Plan   Scar s/p Mohs for SCC, treated on 10/02/23, repaired with linear closure - Reassured that wound is healing well - No evidence of infection - No swelling, induration, purulence, dehiscence, or tenderness out of proportion to the clinical exam, see photo above - Discussed that scars take up to 12 months to mature from the date of surgery - Recommend SPF 30+ to scar daily to prevent purple color from UV exposure during scar maturation process - Discussed that erythema and raised appearance of scar will fade over the next 4-6 months  HISTORY OF SQUAMOUS CELL CARCINOMA OF THE SKIN - No evidence of recurrence today - No lymphadenopathy - Recommend regular full body skin exams - Recommend daily broad spectrum sunscreen SPF 30+ to sun-exposed areas, reapply every 2 hours as needed.  - Call if any new or changing lesions are noted between office visits  Return for follow up with  Dr. Alm for TBSEs.  I, Darice Smock, CMA, am acting as scribe for RUFUS CHRISTELLA HOLY, MD.   Documentation: I have reviewed the above documentation for accuracy and completeness, and I agree with the above.  RUFUS CHRISTELLA HOLY, MD

## 2024-05-14 NOTE — Patient Instructions (Signed)

## 2024-05-15 ENCOUNTER — Encounter: Payer: Self-pay | Admitting: Family Medicine

## 2024-05-15 ENCOUNTER — Ambulatory Visit: Admitting: Family Medicine

## 2024-05-15 ENCOUNTER — Other Ambulatory Visit: Payer: Self-pay

## 2024-05-15 VITALS — BP 126/72 | HR 68 | Ht 66.0 in | Wt 140.0 lb

## 2024-05-15 DIAGNOSIS — M1712 Unilateral primary osteoarthritis, left knee: Secondary | ICD-10-CM

## 2024-05-15 DIAGNOSIS — G8929 Other chronic pain: Secondary | ICD-10-CM | POA: Diagnosis not present

## 2024-05-15 DIAGNOSIS — M25562 Pain in left knee: Secondary | ICD-10-CM | POA: Diagnosis not present

## 2024-05-15 DIAGNOSIS — M7662 Achilles tendinitis, left leg: Secondary | ICD-10-CM | POA: Diagnosis not present

## 2024-05-15 MED ORDER — TRIAMCINOLONE ACETONIDE 32 MG IX SRER
32.0000 mg | Freq: Once | INTRA_ARTICULAR | Status: AC
Start: 2024-05-15 — End: 2024-05-15
  Administered 2024-05-15: 32 mg via INTRA_ARTICULAR

## 2024-05-15 NOTE — Patient Instructions (Addendum)
 Thank you for coming in today.   You received an injection today. Seek immediate medical attention if the joint becomes red, extremely painful, or is oozing fluid.   Please work on the home exercises the athletic trainer went over with you:  View at [email or web address] code [id number]  Can consider repeat injection in 12 weeks and 1 day, if needed.

## 2024-05-15 NOTE — Telephone Encounter (Signed)
 [place]  authorized for left knee Coinsurance 80% Copay $20 Deductible does not apply OOP MAX $3500 has met $596.86 Once OOP has been met coverage goes to 100% and copay will no longer apply NO PA REQUIRED Reference # [id number]

## 2024-05-16 LAB — SURGICAL PATHOLOGY

## 2024-05-19 ENCOUNTER — Ambulatory Visit (INDEPENDENT_AMBULATORY_CARE_PROVIDER_SITE_OTHER)

## 2024-05-19 ENCOUNTER — Ambulatory Visit: Admitting: Family Medicine

## 2024-05-19 ENCOUNTER — Other Ambulatory Visit: Payer: Self-pay

## 2024-05-19 VITALS — BP 126/72 | HR 68 | Ht 66.0 in | Wt 136.8 lb

## 2024-05-19 DIAGNOSIS — M25521 Pain in right elbow: Secondary | ICD-10-CM

## 2024-05-19 NOTE — Progress Notes (Signed)
   LILLETTE Ileana Collet, PhD, LAT, ATC acting as a scribe for Artist Lloyd, MD.  Jennifer Huang is a 73 y.o. female who presents to [facility] at [facility] today for R arm pain. 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  Today, pt reports she hit her R arm this morning on her bed-side table. She fell trying to get out of bed, getting tangled up in the comforter. Pt locates pain to the lateral aspect of her proximal forearm. Notable lump over this area.   No pain with elbow motion.  No weakness noted into her hand.  No numbness.  Pertinent review of systems: No fevers or chills  Relevant historical information: Hypertension.   Exam:  BP 126/72   Pulse 68   Ht 5' 6 (1.676 m)   Wt 136 lb 12.8 oz (62.1 kg)   SpO2 98%   BMI 22.08 kg/m  General: Well Developed, well nourished, and in no acute distress.   MSK: Right forearm swelling present proximal ulna.  Mildly tender to palpation in this area.  Normal elbow motion and strength.  Normal strength distally.    Lab and Radiology Results  X-ray images left elbow obtained today personally and independently interpreted. No acute fractures.  Soft tissue swelling is present over the proximal ulna. Await formal radiology review.    Assessment and Plan: 73 y.o. female with left forearm swelling due to contusion.  Plan for Voltaren gel ice and compression.  Continue normal elbow motion.  Check back as needed.  Refer to occupational therapy if needed.   PDMP not reviewed this encounter. Orders Placed This Encounter  Procedures   DG ELBOW COMPLETE RIGHT (3+VIEW)    Standing Status:   Future    Number of Occurrences:   1    Expiration Date:   06/19/2024    Reason for Exam (SYMPTOM  OR DIAGNOSIS REQUIRED):   right elbow pain    Preferred imaging location?:   [facility]   No orders of the defined types were placed in this encounter.    Discussed warning signs or symptoms. Please see  discharge instructions. Patient expresses understanding.   The above documentation has been reviewed and is accurate and complete Artist Lloyd, M.D.

## 2024-05-19 NOTE — Patient Instructions (Addendum)
 Thank you for coming in today.   Please use Voltaren gel (Generic Diclofenac Gel) up to 4x daily for pain as needed.  This is available over-the-counter as both the name brand Voltaren gel and the generic diclofenac gel.   Try wearing an ace wrap.   Check back as needed  Reminder: Dr. Joane will be out of the office starting August 1st, for about 6 weeks

## 2024-05-20 ENCOUNTER — Ambulatory Visit: Payer: Self-pay | Admitting: Dermatology

## 2024-05-20 NOTE — Progress Notes (Signed)
 Hi Shirron,  Please call pt and let them know that the bx results were slightly abnormal but margins were clear and no follow up treatment is required.  -Dr. Alm

## 2024-05-22 NOTE — Telephone Encounter (Signed)
 Patient advised of results, Patient voiced understanding.

## 2024-05-25 ENCOUNTER — Ambulatory Visit: Payer: Self-pay | Admitting: Family Medicine

## 2024-05-25 NOTE — Progress Notes (Signed)
 Right elbow x-ray shows some soft tissues swelling.  No broken bones.  No significant arthritis.

## 2024-06-02 DIAGNOSIS — H2512 Age-related nuclear cataract, left eye: Secondary | ICD-10-CM | POA: Diagnosis not present

## 2024-06-02 DIAGNOSIS — H401122 Primary open-angle glaucoma, left eye, moderate stage: Secondary | ICD-10-CM | POA: Diagnosis not present

## 2024-06-09 DIAGNOSIS — Z7982 Long term (current) use of aspirin: Secondary | ICD-10-CM | POA: Diagnosis not present

## 2024-06-09 DIAGNOSIS — G8929 Other chronic pain: Secondary | ICD-10-CM | POA: Diagnosis not present

## 2024-06-16 DIAGNOSIS — H2512 Age-related nuclear cataract, left eye: Secondary | ICD-10-CM | POA: Diagnosis not present

## 2024-06-16 DIAGNOSIS — H401122 Primary open-angle glaucoma, left eye, moderate stage: Secondary | ICD-10-CM | POA: Diagnosis not present

## 2024-06-24 DIAGNOSIS — H52222 Regular astigmatism, left eye: Secondary | ICD-10-CM | POA: Diagnosis not present

## 2024-06-24 DIAGNOSIS — H2512 Age-related nuclear cataract, left eye: Secondary | ICD-10-CM | POA: Diagnosis not present

## 2024-06-24 DIAGNOSIS — Z882 Allergy status to sulfonamides status: Secondary | ICD-10-CM | POA: Diagnosis not present

## 2024-06-24 DIAGNOSIS — H401123 Primary open-angle glaucoma, left eye, severe stage: Secondary | ICD-10-CM | POA: Diagnosis not present

## 2024-06-24 DIAGNOSIS — Z85828 Personal history of other malignant neoplasm of skin: Secondary | ICD-10-CM | POA: Diagnosis not present

## 2024-06-24 DIAGNOSIS — Z888 Allergy status to other drugs, medicaments and biological substances status: Secondary | ICD-10-CM | POA: Diagnosis not present

## 2024-06-24 DIAGNOSIS — H401122 Primary open-angle glaucoma, left eye, moderate stage: Secondary | ICD-10-CM | POA: Diagnosis not present

## 2024-06-24 DIAGNOSIS — I10 Essential (primary) hypertension: Secondary | ICD-10-CM | POA: Diagnosis not present

## 2024-06-24 DIAGNOSIS — Z880 Allergy status to penicillin: Secondary | ICD-10-CM | POA: Diagnosis not present

## 2024-06-24 DIAGNOSIS — Z79899 Other long term (current) drug therapy: Secondary | ICD-10-CM | POA: Diagnosis not present

## 2024-06-30 DIAGNOSIS — H35372 Puckering of macula, left eye: Secondary | ICD-10-CM | POA: Diagnosis not present

## 2024-07-28 DIAGNOSIS — Z9842 Cataract extraction status, left eye: Secondary | ICD-10-CM | POA: Diagnosis not present

## 2024-07-28 DIAGNOSIS — H401122 Primary open-angle glaucoma, left eye, moderate stage: Secondary | ICD-10-CM | POA: Diagnosis not present

## 2024-08-06 ENCOUNTER — Ambulatory Visit: Admitting: Sports Medicine

## 2024-08-06 ENCOUNTER — Ambulatory Visit

## 2024-08-06 VITALS — BP 120/82 | HR 87 | Ht 66.0 in | Wt 136.0 lb

## 2024-08-06 DIAGNOSIS — M19011 Primary osteoarthritis, right shoulder: Secondary | ICD-10-CM | POA: Diagnosis not present

## 2024-08-06 DIAGNOSIS — M25511 Pain in right shoulder: Secondary | ICD-10-CM | POA: Diagnosis not present

## 2024-08-06 DIAGNOSIS — W19XXXA Unspecified fall, initial encounter: Secondary | ICD-10-CM | POA: Diagnosis not present

## 2024-08-06 DIAGNOSIS — M25521 Pain in right elbow: Secondary | ICD-10-CM

## 2024-08-06 MED ORDER — TRAMADOL HCL 50 MG PO TABS
50.0000 mg | ORAL_TABLET | Freq: Four times a day (QID) | ORAL | 0 refills | Status: AC | PRN
Start: 2024-08-06 — End: ?

## 2024-08-06 NOTE — Patient Instructions (Addendum)
 Tylenol  [phone] mg 2-3 times a day for pain relief   Tramadol  50 mg daily as needed for severe breakthrough pain   Shoulder HEP   As needed follow up if no improvement 4-6 week follow up

## 2024-08-06 NOTE — Progress Notes (Signed)
 Ben Yvette Roark D.[facility] [address] [place] [address] Phone: [phone]   Assessment and Plan:     1. Right shoulder pain, acute chronicity (Primary) 2. Right elbow pain 3. Fall, initial encounter -Acute, initial visit - Right shoulder, upper arm, elbow pain after fall this morning.  Consistent with muscular contusion. - X-rays obtained in clinic.  My interpretation: No acute fracture or dislocation - Use Tylenol  500 to 1000 mg tablets 2-3 times a day for day-to-day pain relief - Use tramadol  50 mg daily as needed for severe breakthrough pain - Start gentle HEP to shoulder and elbow to prevent stiffness  15 additional minutes spent for educating Therapeutic Home Exercise Program.  This included exercises focusing on stretching, strengthening, with focus on eccentric aspects.   Long term goals include an improvement in range of motion, strength, endurance as well as avoiding reinjury. Patient's frequency would include in 1-2 times a day, 3-5 times a week for a duration of 6-12 weeks. Proper technique shown and discussed handout in great detail with ATC.  All questions were discussed and answered.    Pertinent previous records reviewed include none   Follow Up: As needed   Subjective:   I, Moenique Parris, am serving as a Neurosurgeon for Doctor Morene Mace  Chief Complaint: right arm pain   HPI:   08/06/24 Patient is a 73 year old female with right arm pain. Patient states she had a fall this morning coming down steps. Elbow and shoulder got trapped between the railing and her wall. No radiating pain. Decreased ROM .    Relevant Historical Information: Hypertension, gout,  Additional pertinent review of systems negative.   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  Dispense 1 Dx code: M21.70 Use as needed, Disp: 1 Product, Rfl: 0   amLODipine   (NORVASC ) 10 MG tablet, Take 1 tablet (10 mg total) by mouth daily., Disp: 90 tablet, Rfl: 3   Ascorbic Acid (VITAMIN C PO), Take 250 mg by mouth., Disp: , Rfl:    aspirin EC 81 MG tablet, Take 81 mg by mouth daily. Swallow whole. , Disp: , Rfl:    Azelastine  HCl 0.15 % SOLN, Place 2 sprays into the nose every 12 (twelve) hours as needed., Disp: 30 mL, Rfl: 2   brimonidine  (ALPHAGAN  P) 0.1 % SOLN, 3 (three) times daily., Disp: , Rfl:    CALCIUM PO, Take 600 mg by mouth daily., Disp: , Rfl:    colchicine  0.6 MG tablet, TAKE 1 TABLET (0.6 MG TOTAL) BY MOUTH DAILY AS NEEDED (GOUT PAIN)., Disp: 90 tablet, Rfl: 1   cyclobenzaprine  (FLEXERIL ) 5 MG tablet, Take 1 tablet (5 mg total) by mouth 3 (three) times daily as needed for muscle spasms., Disp: 30 tablet, Rfl: 1   diclofenac Sodium (VOLTAREN) 1 % GEL, Apply 2 g topically 4 (four) times daily as needed., Disp: , Rfl:    dorzolamide -timolol  (COSOPT ) 22.3-6.8 MG/ML ophthalmic solution, 1 drop 2 (two) times daily., Disp: , Rfl:    EPINEPHrine  0.3 mg/0.3 mL IJ SOAJ injection, Inject 0.3 mg into the muscle as needed for anaphylaxis. GENERIC TWINJECT  PREFERRED , Disp: , Rfl:    fexofenadine (ALLEGRA) 180 MG tablet, Take 180 mg by mouth daily., Disp: , Rfl:    fluticasone  (FLONASE ) 50 MCG/ACT nasal spray, Place 2 sprays into both nostrils daily., Disp: , Rfl:    furosemide  (LASIX ) 20 MG tablet,  TAKE 1 TABLET BY MOUTH  DAILY IF NEEDED, Disp: 90 tablet, Rfl: 3   gabapentin  (NEURONTIN ) 100 MG capsule, Take 1-3 capsules (100-300 mg total) by mouth at bedtime as needed. prn, Disp: 90 capsule, Rfl: 2   ipratropium (ATROVENT ) 0.06 % nasal spray, Place 2 sprays into both nostrils 4 (four) times daily., Disp: 15 mL, Rfl: 12   levothyroxine (SYNTHROID) 50 MCG tablet, Take 50 mcg by mouth daily., Disp: , Rfl:    lidocaine  (LIDODERM ) 5 %, Place 1 patch onto the skin daily. Remove & Discard patch within 12 hours or as directed by MD, Disp: 30 patch, Rfl: 0   losartan  (COZAAR) 25 MG tablet, Take 25 mg by mouth daily., Disp: , Rfl:    Multiple Vitamin (MULTIVITAMIN) tablet, Take 1 tablet by mouth daily., Disp: , Rfl:    Multiple Vitamins-Minerals (HAIR SKIN AND NAILS FORMULA PO), Take 1 tablet by mouth daily., Disp: , Rfl:    Olopatadine HCl 0.2 % SOLN, Apply 1 drop to eye daily., Disp: , Rfl:    ROCKLATAN 0.02-0.005 % SOLN, , Disp: , Rfl:    TART CHERRY PO, Take 1 tablet by mouth daily. , Disp: , Rfl:    traMADol  (ULTRAM ) 50 MG tablet, Take 1 tablet (50 mg total) by mouth every 6 (six) hours as needed for up to 10 doses., Disp: 10 tablet, Rfl: 0   Triamcinolone  Acetonide (ZILRETTA ) 32 MG SRER intra-articular injection, Inject 5 mL, intra-articular, right knee, Disp: 1 each, Rfl: 0   vitamin B-12 (CYANOCOBALAMIN ) 1000 MCG tablet, Take 100 mcg by mouth daily., Disp: , Rfl:    VITAMIN D  PO, Take 1,000 Units by mouth daily., Disp: , Rfl:    Objective:     Vitals:   08/06/24 1004  BP: 120/82  Pulse: 87  SpO2: 99%  Weight: 136 lb (61.7 kg)  Height: 5' 6 (1.676 m)      Body mass index is 21.95 kg/m.    Physical Exam:    Gen: Appears well, nad, nontoxic and pleasant Neuro:sensation intact, strength is 5/5 with df/pf/inv/ev, muscle tone wnl Skin: no suspicious lesion or defmority Psych: A&O, appropriate mood and affect  Right arm/shoulder:  No deformity, swelling or muscle wasting No scapular winging Shoulder ROM - FF 150, abd 150, int 10, ext 80 NTTP over the [place], clavicle, ac, coracoid, biceps groove, humerus, deltoid, trapezius, cervical spine Negative Spurling's test bilat FROM of neck    Electronically signed by:  Odis Mace D[facility] 10:49 AM 08/06/24

## 2024-08-11 ENCOUNTER — Ambulatory Visit: Payer: Self-pay | Admitting: Sports Medicine

## 2024-08-12 DIAGNOSIS — Z9009 Acquired absence of other part of head and neck: Secondary | ICD-10-CM | POA: Diagnosis not present

## 2024-08-12 DIAGNOSIS — E039 Hypothyroidism, unspecified: Secondary | ICD-10-CM | POA: Diagnosis not present

## 2024-08-12 DIAGNOSIS — Z8639 Personal history of other endocrine, nutritional and metabolic disease: Secondary | ICD-10-CM | POA: Diagnosis not present

## 2024-08-12 DIAGNOSIS — M810 Age-related osteoporosis without current pathological fracture: Secondary | ICD-10-CM | POA: Diagnosis not present

## 2024-08-14 ENCOUNTER — Other Ambulatory Visit: Payer: Self-pay | Admitting: Nurse Practitioner

## 2024-08-14 DIAGNOSIS — E039 Hypothyroidism, unspecified: Secondary | ICD-10-CM

## 2024-08-18 ENCOUNTER — Ambulatory Visit
Admission: RE | Admit: 2024-08-18 | Discharge: 2024-08-18 | Disposition: A | Discharge status: Home / Self Care | Source: Ambulatory Visit | Attending: Nurse Practitioner | Admitting: Nurse Practitioner

## 2024-08-18 DIAGNOSIS — E039 Hypothyroidism, unspecified: Secondary | ICD-10-CM

## 2024-08-18 DIAGNOSIS — E041 Nontoxic single thyroid nodule: Secondary | ICD-10-CM | POA: Diagnosis not present

## 2024-08-19 DIAGNOSIS — M174 Other bilateral secondary osteoarthritis of knee: Secondary | ICD-10-CM | POA: Diagnosis not present

## 2024-08-25 DIAGNOSIS — Z91030 Bee allergy status: Secondary | ICD-10-CM | POA: Diagnosis not present

## 2024-08-25 DIAGNOSIS — H1045 Other chronic allergic conjunctivitis: Secondary | ICD-10-CM | POA: Diagnosis not present

## 2024-08-25 DIAGNOSIS — J300 Vasomotor rhinitis: Secondary | ICD-10-CM | POA: Diagnosis not present

## 2024-09-02 DIAGNOSIS — M5116 Intervertebral disc disorders with radiculopathy, lumbar region: Secondary | ICD-10-CM | POA: Diagnosis not present

## 2024-09-03 ENCOUNTER — Ambulatory Visit: Admitting: Sports Medicine

## 2024-09-08 NOTE — Progress Notes (Unsigned)
 [facility] [address] [place] [address] Phone: [phone]   Assessment and Plan:     1. Acute pain of right shoulder (Primary) 2. Right elbow pain -Subacute, improving, subsequent visit - Overall mild improvement in right shoulder and arm pain with slow progress since fall in October 2025.  Still most consistent with resolving muscular contusion and rotator cuff tendinopathy/subacromial bursitis - Use Tylenol  500 to 1000 mg tablets 2-3 times a day for day-to-day pain relief - May use tramadol  50 mg daily as needed for severe breakthrough pain - Continue gentle HEP for shoulder - Use topical Voltaren gel, heating pads over areas of pain - Patient may call and ask for physical therapy referral if needed in the future    Pertinent previous records reviewed include none   Follow Up: As needed.  Could consider PT versus ultrasound versus advanced imaging versus CSI.  If no improvements with treatments of shoulder, could further investigate cervical etiology   Subjective:   I, Jennifer Huang, am serving as a neurosurgeon for Doctor Morene Mace   Chief Complaint: right arm pain    HPI:    08/06/24 Patient is a 73 year old female with right arm pain. Patient states she had a fall this morning coming down steps. Elbow and shoulder got trapped between the railing and her wall. No radiating pain. Decreased ROM .    09/09/2024 Patient states still has some decreased ROM. Pain is penetrating down on the muscle    Relevant Historical Information: Hypertension, gout,    Additional pertinent review of systems negative.   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  Dispense 1 Dx code: M21.70 Use as needed, Disp: 1 Product, Rfl: 0   amLODipine  (NORVASC ) 10 MG tablet, Take 1 tablet (10 mg total) by mouth daily., Disp: 90 tablet, Rfl: 3   Ascorbic  Acid (VITAMIN C PO), Take 250 mg by mouth., Disp: , Rfl:    aspirin EC 81 MG tablet, Take 81 mg by mouth daily. Swallow whole. , Disp: , Rfl:    Azelastine  HCl 0.15 % SOLN, Place 2 sprays into the nose every 12 (twelve) hours as needed., Disp: 30 mL, Rfl: 2   brimonidine  (ALPHAGAN  P) 0.1 % SOLN, 3 (three) times daily., Disp: , Rfl:    CALCIUM PO, Take 600 mg by mouth daily., Disp: , Rfl:    colchicine  0.6 MG tablet, TAKE 1 TABLET (0.6 MG TOTAL) BY MOUTH DAILY AS NEEDED (GOUT PAIN)., Disp: 90 tablet, Rfl: 1   cyclobenzaprine  (FLEXERIL ) 5 MG tablet, Take 1 tablet (5 mg total) by mouth 3 (three) times daily as needed for muscle spasms., Disp: 30 tablet, Rfl: 1   diclofenac Sodium (VOLTAREN) 1 % GEL, Apply 2 g topically 4 (four) times daily as needed., Disp: , Rfl:    dorzolamide -timolol  (COSOPT ) 22.3-6.8 MG/ML ophthalmic solution, 1 drop 2 (two) times daily., Disp: , Rfl:    EPINEPHrine  0.3 mg/0.3 mL IJ SOAJ injection, Inject 0.3 mg into the muscle as needed for anaphylaxis. GENERIC TWINJECT  PREFERRED , Disp: , Rfl:    fexofenadine (ALLEGRA) 180 MG tablet, Take 180 mg by mouth daily., Disp: , Rfl:    fluticasone  (FLONASE ) 50 MCG/ACT nasal spray, Place 2 sprays into both nostrils daily., Disp: , Rfl:    furosemide  (LASIX ) 20 MG tablet, TAKE 1 TABLET BY MOUTH  DAILY IF NEEDED, Disp: 90 tablet,  Rfl: 3   gabapentin  (NEURONTIN ) 100 MG capsule, Take 1-3 capsules (100-300 mg total) by mouth at bedtime as needed. prn, Disp: 90 capsule, Rfl: 2   ipratropium (ATROVENT ) 0.06 % nasal spray, Place 2 sprays into both nostrils 4 (four) times daily., Disp: 15 mL, Rfl: 12   levothyroxine (SYNTHROID) 50 MCG tablet, Take 50 mcg by mouth daily., Disp: , Rfl:    lidocaine  (LIDODERM ) 5 %, Place 1 patch onto the skin daily. Remove & Discard patch within 12 hours or as directed by MD, Disp: 30 patch, Rfl: 0   losartan (COZAAR) 25 MG tablet, Take 25 mg by mouth daily., Disp: , Rfl:    Multiple Vitamin (MULTIVITAMIN) tablet,  Take 1 tablet by mouth daily., Disp: , Rfl:    Multiple Vitamins-Minerals (HAIR SKIN AND NAILS FORMULA PO), Take 1 tablet by mouth daily., Disp: , Rfl:    Olopatadine HCl 0.2 % SOLN, Apply 1 drop to eye daily., Disp: , Rfl:    ROCKLATAN 0.02-0.005 % SOLN, , Disp: , Rfl:    TART CHERRY PO, Take 1 tablet by mouth daily. , Disp: , Rfl:    traMADol  (ULTRAM ) 50 MG tablet, Take 1 tablet (50 mg total) by mouth every 6 (six) hours as needed for up to 10 doses., Disp: 10 tablet, Rfl: 0   Triamcinolone  Acetonide (ZILRETTA ) 32 MG SRER intra-articular injection, Inject 5 mL, intra-articular, right knee, Disp: 1 each, Rfl: 0   vitamin B-12 (CYANOCOBALAMIN ) 1000 MCG tablet, Take 100 mcg by mouth daily., Disp: , Rfl:    VITAMIN D  PO, Take 1,000 Units by mouth daily., Disp: , Rfl:    Objective:     Vitals:   09/09/24 1113  Pulse: 62  SpO2: 100%  Weight: 136 lb (61.7 kg)  Height: 5' 6 (1.676 m)      Body mass index is 21.95 kg/m.    Physical Exam:    Gen: Appears well, nad, nontoxic and pleasant Neuro:sensation intact, strength is 5/5 with df/pf/inv/ev, muscle tone wnl Skin: no suspicious lesion or defmority Psych: A&O, appropriate mood and affect   Right arm/shoulder:  No deformity, swelling or muscle wasting No scapular winging Shoulder ROM - FF 170, abd 160, int 10, ext 80 NTTP over the [place], clavicle, ac, coracoid, biceps groove, humerus, deltoid, trapezius, cervical spine Negative Spurling's test bilat FROM of neck     Electronically signed by:  Odis Mace [facility] 1:21 PM 09/09/24

## 2024-09-09 ENCOUNTER — Ambulatory Visit: Admitting: Sports Medicine

## 2024-09-09 VITALS — HR 62 | Ht 66.0 in | Wt 136.0 lb

## 2024-09-09 DIAGNOSIS — M25511 Pain in right shoulder: Secondary | ICD-10-CM | POA: Diagnosis not present

## 2024-09-09 DIAGNOSIS — M25521 Pain in right elbow: Secondary | ICD-10-CM | POA: Diagnosis not present

## 2024-09-09 NOTE — Patient Instructions (Signed)
 Tylenol  [phone] mg 2-3 times a day for pain relief   Voltaren gel , heating pads of shoulder  Tramadol  as needed for severe breakthrough pain   Continue HEP   Call us  if you would like a PT referral   As needed follow up

## 2024-10-01 DIAGNOSIS — Z970 Presence of artificial eye: Secondary | ICD-10-CM | POA: Diagnosis not present

## 2024-10-01 DIAGNOSIS — F909 Attention-deficit hyperactivity disorder, unspecified type: Secondary | ICD-10-CM | POA: Diagnosis not present

## 2024-10-01 DIAGNOSIS — E039 Hypothyroidism, unspecified: Secondary | ICD-10-CM | POA: Diagnosis not present

## 2024-10-01 DIAGNOSIS — M810 Age-related osteoporosis without current pathological fracture: Secondary | ICD-10-CM | POA: Diagnosis not present

## 2024-10-08 DIAGNOSIS — M5116 Intervertebral disc disorders with radiculopathy, lumbar region: Secondary | ICD-10-CM | POA: Diagnosis not present

## 2024-10-09 DIAGNOSIS — S0571XD Avulsion of right eye, subsequent encounter: Secondary | ICD-10-CM | POA: Diagnosis not present

## 2024-10-13 DIAGNOSIS — K080 Exfoliation of teeth due to systemic causes: Secondary | ICD-10-CM | POA: Diagnosis not present

## 2024-11-07 ENCOUNTER — Telehealth: Payer: Self-pay

## 2024-11-07 NOTE — Telephone Encounter (Signed)
 Fax request received from [employer] for Fosamax .   Per visit note 02/2024, this medication was discontinued.   Called pt and confirmed that she is not taking Fosamax  d/t side effects, will disregard fax request from [employer].

## 2024-11-13 ENCOUNTER — Other Ambulatory Visit: Payer: Self-pay

## 2024-11-17 ENCOUNTER — Ambulatory Visit: Admitting: Dermatology

## 2025-08-11 ENCOUNTER — Ambulatory Visit: Admitting: Dermatology
